# Patient Record
Sex: Male | Born: 1945 | Race: White | Hispanic: No | Marital: Married | Smoking: Never smoker
Health system: Southern US, Community
[De-identification: ages and names within clinical notes are randomized; demographics above are authoritative.]

## PROBLEM LIST (undated history)

## (undated) DIAGNOSIS — K5730 Diverticulosis of large intestine without perforation or abscess without bleeding: Secondary | ICD-10-CM

## (undated) DIAGNOSIS — N182 Chronic kidney disease, stage 2 (mild): Secondary | ICD-10-CM

## (undated) DIAGNOSIS — R112 Nausea with vomiting, unspecified: Secondary | ICD-10-CM

## (undated) DIAGNOSIS — K589 Irritable bowel syndrome without diarrhea: Secondary | ICD-10-CM

## (undated) DIAGNOSIS — E785 Hyperlipidemia, unspecified: Secondary | ICD-10-CM

## (undated) DIAGNOSIS — I10 Essential (primary) hypertension: Secondary | ICD-10-CM

## (undated) DIAGNOSIS — I2510 Atherosclerotic heart disease of native coronary artery without angina pectoris: Secondary | ICD-10-CM

## (undated) DIAGNOSIS — N400 Enlarged prostate without lower urinary tract symptoms: Secondary | ICD-10-CM

## (undated) DIAGNOSIS — H33311 Horseshoe tear of retina without detachment, right eye: Secondary | ICD-10-CM

## (undated) DIAGNOSIS — K219 Gastro-esophageal reflux disease without esophagitis: Secondary | ICD-10-CM

## (undated) DIAGNOSIS — Z9889 Other specified postprocedural states: Secondary | ICD-10-CM

## (undated) HISTORY — DX: Atherosclerotic heart disease of native coronary artery without angina pectoris: I25.10

## (undated) HISTORY — PX: RETINAL DETACHMENT SURGERY: SHX105

## (undated) HISTORY — PX: CHOLECYSTECTOMY: SHX55

## (undated) HISTORY — DX: Essential (primary) hypertension: I10

## (undated) HISTORY — PX: TEAR DUCT PROBING WITH STRABISMUS REPAIR: SHX5677

## (undated) HISTORY — DX: Chronic kidney disease, stage 2 (mild): N18.2

## (undated) HISTORY — DX: Gastro-esophageal reflux disease without esophagitis: K21.9

## (undated) HISTORY — DX: Benign prostatic hyperplasia without lower urinary tract symptoms: N40.0

## (undated) HISTORY — DX: Hyperlipidemia, unspecified: E78.5

## (undated) HISTORY — DX: Diverticulosis of large intestine without perforation or abscess without bleeding: K57.30

---

## 1993-04-16 HISTORY — PX: HIATAL HERNIA REPAIR: SHX195

## 1997-04-16 HISTORY — PX: LUMBAR LAMINECTOMY: SHX95

## 1997-08-26 ENCOUNTER — Inpatient Hospital Stay (HOSPITAL_COMMUNITY): Admission: RE | Admit: 1997-08-26 | Discharge: 1997-08-27 | Payer: Self-pay | Admitting: Neurosurgery

## 1998-04-16 HISTORY — PX: CORONARY ARTERY BYPASS GRAFT: SHX141

## 1998-08-16 ENCOUNTER — Inpatient Hospital Stay (HOSPITAL_COMMUNITY): Admission: AD | Admit: 1998-08-16 | Discharge: 1998-08-23 | Payer: Self-pay | Admitting: Cardiology

## 1998-08-17 ENCOUNTER — Encounter: Payer: Self-pay | Admitting: Cardiothoracic Surgery

## 1998-08-18 ENCOUNTER — Encounter: Payer: Self-pay | Admitting: Cardiothoracic Surgery

## 1998-08-19 ENCOUNTER — Encounter: Payer: Self-pay | Admitting: Cardiothoracic Surgery

## 1998-08-20 ENCOUNTER — Encounter: Payer: Self-pay | Admitting: Cardiothoracic Surgery

## 2000-10-04 ENCOUNTER — Ambulatory Visit (HOSPITAL_COMMUNITY): Admission: RE | Admit: 2000-10-04 | Discharge: 2000-10-04 | Payer: Self-pay | Admitting: Internal Medicine

## 2001-11-05 ENCOUNTER — Ambulatory Visit (HOSPITAL_COMMUNITY): Admission: RE | Admit: 2001-11-05 | Discharge: 2001-11-05 | Payer: Self-pay | Admitting: Internal Medicine

## 2002-11-23 ENCOUNTER — Emergency Department (HOSPITAL_COMMUNITY): Admission: EM | Admit: 2002-11-23 | Discharge: 2002-11-24 | Payer: Self-pay | Admitting: *Deleted

## 2003-05-10 ENCOUNTER — Emergency Department (HOSPITAL_COMMUNITY): Admission: EM | Admit: 2003-05-10 | Discharge: 2003-05-10 | Payer: Self-pay | Admitting: Emergency Medicine

## 2003-06-08 ENCOUNTER — Ambulatory Visit (HOSPITAL_COMMUNITY): Admission: RE | Admit: 2003-06-08 | Discharge: 2003-06-08 | Payer: Self-pay | Admitting: Cardiovascular Disease

## 2004-03-22 DIAGNOSIS — C4492 Squamous cell carcinoma of skin, unspecified: Secondary | ICD-10-CM

## 2004-03-22 DIAGNOSIS — C4491 Basal cell carcinoma of skin, unspecified: Secondary | ICD-10-CM

## 2004-03-22 HISTORY — DX: Basal cell carcinoma of skin, unspecified: C44.91

## 2004-03-22 HISTORY — DX: Squamous cell carcinoma of skin, unspecified: C44.92

## 2004-07-10 ENCOUNTER — Ambulatory Visit: Payer: Self-pay | Admitting: Cardiology

## 2004-10-06 ENCOUNTER — Encounter (INDEPENDENT_AMBULATORY_CARE_PROVIDER_SITE_OTHER): Payer: Self-pay | Admitting: Specialist

## 2004-10-06 ENCOUNTER — Ambulatory Visit (HOSPITAL_COMMUNITY): Admission: RE | Admit: 2004-10-06 | Discharge: 2004-10-06 | Payer: Self-pay | Admitting: General Surgery

## 2004-10-13 ENCOUNTER — Observation Stay (HOSPITAL_COMMUNITY): Admission: EM | Admit: 2004-10-13 | Discharge: 2004-10-14 | Payer: Self-pay | Admitting: Emergency Medicine

## 2005-04-16 HISTORY — PX: HEMORRHOID SURGERY: SHX153

## 2005-11-29 DIAGNOSIS — D229 Melanocytic nevi, unspecified: Secondary | ICD-10-CM

## 2005-11-29 HISTORY — DX: Melanocytic nevi, unspecified: D22.9

## 2006-02-01 ENCOUNTER — Encounter (INDEPENDENT_AMBULATORY_CARE_PROVIDER_SITE_OTHER): Payer: Self-pay | Admitting: Specialist

## 2006-02-01 ENCOUNTER — Observation Stay (HOSPITAL_COMMUNITY): Admission: RE | Admit: 2006-02-01 | Discharge: 2006-02-02 | Payer: Self-pay | Admitting: General Surgery

## 2006-02-14 ENCOUNTER — Ambulatory Visit: Payer: Self-pay | Admitting: Cardiology

## 2006-03-27 ENCOUNTER — Ambulatory Visit (HOSPITAL_COMMUNITY): Admission: RE | Admit: 2006-03-27 | Discharge: 2006-03-27 | Payer: Self-pay | Admitting: Urology

## 2007-02-17 ENCOUNTER — Ambulatory Visit: Payer: Self-pay | Admitting: Cardiology

## 2007-08-15 HISTORY — PX: ESOPHAGOGASTRODUODENOSCOPY: SHX1529

## 2007-08-22 ENCOUNTER — Ambulatory Visit: Payer: Self-pay | Admitting: Internal Medicine

## 2007-08-22 ENCOUNTER — Ambulatory Visit (HOSPITAL_COMMUNITY): Admission: EM | Admit: 2007-08-22 | Discharge: 2007-08-22 | Payer: Self-pay | Admitting: Emergency Medicine

## 2008-03-12 ENCOUNTER — Ambulatory Visit (HOSPITAL_COMMUNITY): Admission: RE | Admit: 2008-03-12 | Discharge: 2008-03-12 | Payer: Self-pay | Admitting: Family Medicine

## 2008-03-24 ENCOUNTER — Ambulatory Visit: Payer: Self-pay | Admitting: Cardiology

## 2008-04-16 DIAGNOSIS — N182 Chronic kidney disease, stage 2 (mild): Secondary | ICD-10-CM

## 2008-04-16 HISTORY — DX: Chronic kidney disease, stage 2 (mild): N18.2

## 2008-07-09 ENCOUNTER — Encounter (INDEPENDENT_AMBULATORY_CARE_PROVIDER_SITE_OTHER): Payer: Self-pay | Admitting: General Surgery

## 2008-07-09 ENCOUNTER — Ambulatory Visit (HOSPITAL_COMMUNITY): Admission: RE | Admit: 2008-07-09 | Discharge: 2008-07-09 | Payer: Self-pay | Admitting: General Surgery

## 2009-02-28 ENCOUNTER — Telehealth (INDEPENDENT_AMBULATORY_CARE_PROVIDER_SITE_OTHER): Payer: Self-pay | Admitting: *Deleted

## 2009-03-14 ENCOUNTER — Telehealth (INDEPENDENT_AMBULATORY_CARE_PROVIDER_SITE_OTHER): Payer: Self-pay

## 2009-03-15 ENCOUNTER — Encounter (INDEPENDENT_AMBULATORY_CARE_PROVIDER_SITE_OTHER): Payer: Self-pay | Admitting: *Deleted

## 2009-03-15 ENCOUNTER — Encounter: Payer: Self-pay | Admitting: Cardiology

## 2009-03-23 ENCOUNTER — Encounter: Payer: Self-pay | Admitting: Cardiology

## 2009-03-23 LAB — CONVERTED CEMR LAB
AST: 19 units/L (ref 0–37)
Albumin: 4.2 g/dL (ref 3.5–5.2)
Alkaline Phosphatase: 57 units/L (ref 39–117)
Chloride: 103 meq/L (ref 96–112)
Glucose, Bld: 112 mg/dL — ABNORMAL HIGH (ref 70–99)
LDL Cholesterol: 71 mg/dL (ref 0–99)
Potassium: 5 meq/L (ref 3.5–5.3)
Sodium: 139 meq/L (ref 135–145)
Total Protein: 6.8 g/dL (ref 6.0–8.3)

## 2009-03-24 ENCOUNTER — Encounter (INDEPENDENT_AMBULATORY_CARE_PROVIDER_SITE_OTHER): Payer: Self-pay | Admitting: *Deleted

## 2009-04-05 ENCOUNTER — Ambulatory Visit: Payer: Self-pay | Admitting: Cardiology

## 2009-04-05 ENCOUNTER — Encounter (INDEPENDENT_AMBULATORY_CARE_PROVIDER_SITE_OTHER): Payer: Self-pay | Admitting: *Deleted

## 2009-04-05 DIAGNOSIS — N183 Chronic kidney disease, stage 3 unspecified: Secondary | ICD-10-CM | POA: Insufficient documentation

## 2009-04-11 ENCOUNTER — Ambulatory Visit (HOSPITAL_COMMUNITY): Admission: RE | Admit: 2009-04-11 | Discharge: 2009-04-11 | Payer: Self-pay | Admitting: Cardiology

## 2009-04-19 ENCOUNTER — Encounter (INDEPENDENT_AMBULATORY_CARE_PROVIDER_SITE_OTHER): Payer: Self-pay | Admitting: *Deleted

## 2009-10-03 LAB — CONVERTED CEMR LAB
ALT: 15 units/L (ref 0–53)
Albumin: 4 g/dL (ref 3.5–5.2)
CO2: 26 meq/L (ref 19–32)
Calcium: 9.6 mg/dL (ref 8.4–10.5)
Chloride: 105 meq/L (ref 96–112)
Cholesterol: 142 mg/dL (ref 0–200)
Potassium: 4.8 meq/L (ref 3.5–5.3)
Sodium: 140 meq/L (ref 135–145)
Total Protein: 6.5 g/dL (ref 6.0–8.3)
VLDL: 25 mg/dL (ref 0–40)

## 2010-04-06 ENCOUNTER — Ambulatory Visit: Payer: Self-pay | Admitting: Cardiology

## 2010-04-07 ENCOUNTER — Encounter: Payer: Self-pay | Admitting: Adult Health

## 2010-04-07 LAB — CONVERTED CEMR LAB
ALT: 14 units/L (ref 0–53)
Cholesterol: 154 mg/dL (ref 0–200)
HDL: 41 mg/dL (ref >39)
Indirect Bilirubin: 0.8 mg/dL (ref 0.0–0.9)
Total CHOL/HDL Ratio: 3.8
Total Protein: 6.1 g/dL (ref 6.0–8.3)
Triglycerides: 127 mg/dL (ref <150)
VLDL: 25 mg/dL (ref 0–40)

## 2010-05-16 NOTE — Letter (Signed)
Summary: Bertha Results Engineer, agricultural at [facility]  [address], [place] [address]   Phone: [phone]  Fax: [phone]      April 19, 2009 MRN: [id number]   [name] [address] [place], [place]  [address]   Dear Mr. KUZEL,  Your test ordered by Selena Batten has been reviewed by your physician (or physician assistant) and was found to be normal or stable. Your physician (or physician assistant) felt no changes were needed at this time.  ____ Echocardiogram  ____ Cardiac Stress Test  ____ Lab Work  __x__ Peripheral vascular study of arms, legs or neck  ____ CT scan or X-ray  ____ Lung or Breathing test  ____ Other: No change in medical treatment at this time, per Dr. Dietrich Pates.   Thank you, Artavia Jeanlouis Allyne Gee RN    [place] Bing, MD, Lenise Arena.C.Gaylord Shih, MD, F.A.C.C Lewayne Bunting, MD, F.A.C.C Nona Dell, MD, F.A.C.C Charlton Haws, MD, Lenise Arena.C.C

## 2010-05-18 NOTE — Assessment & Plan Note (Signed)
Summary: F1Y   Visit Type:  Follow-up Primary Provider:  Dr.Golding   History of Present Illness: [name] is a 65 y/o CM we are following for continued assessement and treatment of CAD, with known history of 4 vessel CABG 2000, Hypercholesterolemia. He had a stress test 5 years ago which was normal. He remains active, walking every day, hunting and fishing.  He is without complaints.  He has had no new diagnosis or allergies since being seen last.  Current Medications (verified): 1)  Vytorin 10-20 Mg Tabs (Ezetimibe-Simvastatin) .... Take 1 Tablet By Mouth Once Daily 2)  Lisinopril 10 Mg Tabs (Lisinopril) .... Take 1 Tab Daily 3)  Flomax 0.4 Mg Caps (Tamsulosin Hcl) .... Take 1 Tab Daily 4)  Aspir-Low 81 Mg Tbec (Aspirin) .... Take 1 Tab Daily 5)  Coq10 100 Mg Caps (Coenzyme Q10) .... Take 1 Tab Daily 6)  Daily Multi  Tabs (Multiple Vitamins-Minerals) .... Take 1 Tab Daily 7)  Viagra 50 Mg Tabs (Sildenafil Citrate) .... Take As Directed 8)  Eql Omeprazole 20 Mg Tbec (Omeprazole) .... One B.i.d. 9)  Prosvent .... Take 1 Tab Daily 10)  Fish Oil 1000 Mg Caps (Omega-3 Fatty Acids) .... Take 1 Tab Daily  Allergies (verified): 1)  ! Codeine 2)  ! Nubain  Comments:  Nurse/Medical Assistant: patient brought meds the only new one is prosvent to help with his flomas for prostate patient uses [facility]  Past History:  Past medical, surgical, family and social histories (including risk factors) reviewed, and no changes noted (except as noted below).  Past Medical History: Reviewed history from 04/05/2009 and no changes required. ASCVD: CABG surgery in 08/1998. GERD: Status post esophageal dilatation and repair of hiatal hernia. Hypertension. Hyperlipidemia Benign prostatic hypertrophy with history of prostatitis/urinary tract infection in 2006 Hiatal hernia Sigmoid diverticulosis  Past Surgical History: Reviewed history from 04/05/2009 and no changes  required. Cholecystectomy-1990s. Hiatal hernia repair: 1995. Lumbar laminectomy-1999 Iinternal hemorrhoidectomy-2007  Family History: Reviewed history from 03/24/2008 and no changes required. Father died at age 38 due to myocardial infarction. Mother alive and well at age 88. Paternal grandfather died at age 67 due to myocardial infarction. Only sibling, a brother, died you to trauma.  Social History: Reviewed history from 03/24/2008 and no changes required. Previously employed as an Public affairs consultant. No use of tobacco products. No excessive alcohol.  Review of Systems       All other systems have been reviewed and are negative unless stated above.   Vital Signs:  Patient profile:   65 year old male Weight:      181 pounds BMI:     26.06 O2 Sat:      98 % on Room air Pulse rate:   64 / minute BP sitting:   151 / 78  (right arm)  Vitals Entered By: Michael Saa, CNA (April 06, 2010 11:20 AM)  O2 Flow:  Room air  Physical Exam  General:  Well developed, well nourished, in no acute distress. Head:  normocephalic and atraumatic Eyes:  PERRLA/EOM intact; conjunctiva and lids normal. Neck:  Neck supple, no JVD. No masses, thyromegaly or abnormal cervical nodes. Lungs:  Clear bilaterally to auscultation and percussion. Heart:  Non-displaced PMI, chest non-tender; regular rate and rhythm, S1, S2 without murmurs, rubs or gallops. Carotid upstroke normal, no bruit. Normal abdominal aortic size, no bruits. Femorals normal pulses, no bruits. Pedals normal pulses. No edema, no varicosities. Abdomen:  Bowel sounds positive; abdomen soft and non-tender without masses, organomegaly,  or hernias noted. No hepatosplenomegaly. Msk:  Back normal, normal gait. Muscle strength and tone normal. Pulses:  pulses normal in all 4 extremities Extremities:  No clubbing or cyanosis. Neurologic:  Alert and oriented x 3. Psych:  Normal affect.   EKG  Procedure date:   04/06/2010  Findings:      Normal sinus rhythm with rate of:60 bpm  Right bundle branch block.  No significant change from one year ago.  Impression & Recommendations:  Problem # 1:  ATHEROSCLEROTIC CARDIOVASCULAR DISEASE-CABG 2000 (ICD-429.2) He is without complaint and remains active.  I have discussed the need to follow-up next year with a stress test for continued evaluation of CAD.  At this time is remarkably well 11 years post CABG.  He has not required NTG and has not had discomfort.  We will continue him on current medications and seen him in one year.  Will plan stress myoview at that time.  Problem # 2:  HYPERLIPIDEMIA (ICD-272.4) Will check status as fish oil has been added.  Then annually after that. His updated medication list for this problem includes:    Vytorin 10-20 Mg Tabs (Ezetimibe-simvastatin) .Marland Kitchen... Take 1 tablet by mouth once daily  Orders: T-Lipid Profile ([id number]) T-Hepatic Function [phone])  Problem # 3:  HYPERTENSION (ICD-401.9) Recheck in the exam room demonstrated BP of 133/78.  We will make no changes in medications at this time. His updated medication list for this problem includes:    Lisinopril 10 Mg Tabs (Lisinopril) .Marland Kitchen... Take 1 tab daily    Aspir-low 81 Mg Tbec (Aspirin) .Marland Kitchen... Take 1 tab daily  Patient Instructions: 1)  Your physician recommends that you schedule a follow-up appointment in: 12 months 2)  Your physician recommends that you return for lab work in: this week 3)  Your physician recommends that you continue on your current medications as directed. Please refer to the Current Medication list given to you today. Prescriptions: VYTORIN 10-20 MG TABS (EZETIMIBE-SIMVASTATIN) Take 1 tablet by mouth once daily  #90 x 3   Entered by:   [name]   Authorized by:   Joni Reining, NP   Signed by:   [name] on 30/86/5784   Method used:   Faxed to ...       [facility] (mail-order)             , [place]         Ph: [phone]        Fax: [phone]   RxID:   [id number] LISINOPRIL 10 MG TABS (LISINOPRIL) take 1 tab daily  #90 x 3   Entered by:   [name]   Authorized by:   Joni Reining, NP   Signed by:   [name] on 38/75/6433   Method used:   Faxed to ...       [facility] (mail-order)             , [place]         Ph: [phone]       Fax: [phone]   RxID:   [id number] VYTORIN 10-20 MG TABS (EZETIMIBE-SIMVASTATIN) Take 1 tablet by mouth once daily  #30 x 11   Entered by:   [name]   Authorized by:   Joni Reining, NP   Signed by:   [name] on 62/37/6283   Method used:   Faxed to ...       [facility] (mail-order)             ,  [place]         Ph: [phone]       Fax: [phone]   RxID:   [id number]

## 2010-07-27 LAB — CBC
Hemoglobin: 14 g/dL (ref 13.0–17.0)
Platelets: 167 10*3/uL (ref 150–400)
RDW: 13.4 % (ref 11.5–15.5)

## 2010-07-27 LAB — BASIC METABOLIC PANEL
Calcium: 9.8 mg/dL (ref 8.4–10.5)
GFR calc non Af Amer: 45 mL/min — ABNORMAL LOW (ref >60)
Glucose, Bld: 111 mg/dL — ABNORMAL HIGH (ref 70–99)
Sodium: 138 mEq/L (ref 135–145)

## 2010-08-29 NOTE — Letter (Signed)
February 17, 2007    Patrica Duel, M.D.  [address]  [place], [place] [address]   RE:  [name]  MRN:  [id number]  /  DOB:  1945/11/11   Dear Loraine Leriche:   [name] returns to the office for continued assessment and treatment  of coronary disease, now 8 years following CABG surgery.  He continues  to do superbly with good exercise tolerance, no chest discomfort and no  dyspnea.  He does have problems with GERD, that are relatively well  controlled with medical therapy.  Hypertension and hyperlipidemia have  been under excellent control.   CURRENT MEDICATIONS:  1. Lisinopril 10 mg daily.  2. Aspirin 81 mg daily.  3. A multivitamin.  4. Vytorin 10/20 mg daily - he experiences muscle weakness with higher      doses of statin.  5. Flomax 0.4 mg daily.  6. Coenzyme Q.  7. Prilosec 20 mg b.i.d.  8. Fish Oil 1000 mg daily.   EXAM:  A vigorous appearing gentleman in no acute distress.  The weight  is 178, 8 pounds more than last year.  Blood pressure 115/70, heart rate  62 and regular, respirations 16.  NECK:  No jugular venous distention; normal carotid upstrokes without  bruits.  LUNGS:  Clear.  CARDIAC:  Normal first and second heart sounds; fourth heart sound  present.  ABDOMEN:  Soft and nontender; no organomegaly.  EXTREMITIES:  No edema; distal pulses intact except for a decreased left  dorsalis pedis.   Recent lipid profile was excellent with total cholesterol of 157,  triglycerides of 158, HDL of 46 and LDL of 79.   IMPRESSION:  [name]'s medical therapy is optimal.  I suggested that  he could increase his dose of Fish Oil to 2 to 4 capsules over the  course of a day.  He remains opposed to influenza vaccine.  I will see  this nice gentleman again in 1 year.    Sincerely,      Gerrit Friends. Dietrich Pates, MD, [facility]  Electronically Signed    RMR/MedQ  DD: 02/17/2007  DT: 02/18/2007  Job #: [id number]

## 2010-08-29 NOTE — Op Note (Signed)
NAMEALMA, MUEGGE              ACCOUNT NO.:  [id number]   MEDICAL RECORD NO.:  [id number]          PATIENT TYPE:  AMB   LOCATION:  DAY                           FACILITY:  [facility]   PHYSICIAN:  R. Roetta Sessions, M.D. DATE OF BIRTH:  [birth year]   DATE OF PROCEDURE:  DATE OF DISCHARGE:                               OPERATIVE REPORT   INDICATIONS FOR PROCEDURE:  This 65 year old gentleman with a history  esophageal rings/strictures undergone dilation by me in the past last in  2002.  He was eating some chicken yesterday and felt the pain.  He came  to see Dr. Margretta Ditty today as he had continued symptoms.  Dr. Margretta Ditty  called me.  An urgent EGD is now being done with plans for esophageal  food disimpaction as appropriate.  The potential for dilation was  discussed, but if he had a food impaction at home, Mr. Halley had to come  back for subsequent procedure.  Risks, benefits, alternatives, and  limitations have been reviewed.  Questions are answered.  Please see the  documentation in the medical record.   PROCEDURE NOTE:  O2 saturation, blood pressure, pulse, and respirations  monitored throughout the entire procedure.   CONSCIOUS SEDATION:  Versed 3 mg IV and Demerol 75 mg IV in divided  doses.   INSTRUMENT:  Pentax video chip system.   FINDINGS:  Examination of the tubular esophagus revealed an empty  esophagus with a tight fibrous ring at the EG junction, which would not  initially admit the scope.  With some gentle pressure, this ring was  dilated and the scope was passed.   Stomach:  Gas cavity was emptied and insufflated well with air.  The  examination of the gastric mucosa including retroflexed view of the  proximal stomach and esophagogastric junction demonstrated only a  moderate size hiatal hernia.  Pylorus patent, easily traversed.  Examination of the bulb and second portion revealed no abnormality.   THERAPEUTIC/DIAGNOSTIC MANEUVERS PERFORMED:  The scope was  withdrawn.  A  56-French Maloney dilators was passed with full insertion with slight  resistance, look back revealed a nice disruption of the ring/stricture  without apparent complication.  The patient tolerated the procedure well  and was reactive in endoscopy.   IMPRESSION:  Critical esophageal ring/stricture as described above,  status post disruption and dilation as described above.  No food  impaction, otherwise normal esophagus, hiatal hernia, otherwise normal  stomach, D1 and D2.   RECOMMENDATIONS:  Chew food thoroughly.  Take 30 minutes to eat and have  liquids on hand to assist with swallowing.  Continue Prilosec 20 mg  orally b.i.d.  Mr. Kemler has to come if he has any future difficulty  swallowing.      Jonathon Bellows, M.D.  Electronically Signed     RMR/MEDQ  D:  08/22/2007  T:  08/23/2007  Job:  [id number]   cc:   Patrica Duel, M.D.  Fax: [phone]   Rhae Lerner. Margretta Ditty, M.D.  [address]  [place]  [place] [address]

## 2010-08-29 NOTE — Op Note (Signed)
[name], [name]              ACCOUNT NO.:  [id number]   MEDICAL RECORD NO.:  [id number]          PATIENT TYPE:  AMB   LOCATION:  DAY                           FACILITY:  [facility]   PHYSICIAN:  Tilford Pillar, MD      DATE OF BIRTH:  Mar 30, 1946   DATE OF PROCEDURE:  07/09/2008  DATE OF DISCHARGE:  07/09/2008                               OPERATIVE REPORT   PREOPERATIVE DIAGNOSIS:  Lipoma of the right flank.   POSTOPERATIVE DIAGNOSIS:  Lipoma of the right flank.   PROCEDURE:  Excision of lipoma via 2-cm incision.   SURGEON:  Dr. Tilford Pillar.   ANESTHESIA:  MAC with local anesthetic.   SPECIMEN:  Lipoma.   ESTIMATED BLOOD LOSS:  Minimal.   COMPLICATIONS:  None.   DISPOSITION:  To postanesthetic care unit in stable condition.   INDICATIONS:  The patient is a 65 year old male who presented to my  office with a history of a lump in his right flank.  This was evaluated  and was suspected to be a lipoma.  The risks, benefits and alternatives  of excision were discussed at length with the patient, including, but  not limited to the risk of bleeding, infection and recurrence.  The  patient's questions were answered.  The patient was consented for the  planned procedure.   OPERATION:  The patient was taken to the operating and placed in the  supine position on the operating room table at which time the sedation  was administered.  Once the patient was asleep, he was repositioned so  that his right flank was elevated on a roll.  Care was taken to ensure  no decubitus pressure points were encountered with proper padding of the  patient.  At this time, the patient's right flank was prepped with  DuraPrep solution.  Drapes were placed in the standard fashion.  Local  anesthetic was instilled over the planned site of dissection.  An  elliptical incision was created over the palpable lipoma.  Additional  dissection down to subcutaneous tissue was carried out using  electrocautery.   Upon encountering the lipoma, it was freed  circumferentially.  Once it was free, it was excised and placed on the  back table and sent as a permanent specimen to pathology.  Hemostasis  was obtained using electrocautery.  The surgical field was irrigated and  then a 4-0 Monocryl was utilized to reapproximate the skin edges in a  running subcuticular suture.  The skin was washed and dried with a moist  and dry towel.  Benzoin was applied around the incision.  Half inch  Steri-Strips were placed.  The drapes were removed.  The patient was  replaced back into a supine position and allowed to awaken from the  sedation.  Once awake, he was transferred back to the regular hospital  bed and transferred to the postanesthetic care unit in stable condition.  At the conclusion of the procedure, all instrument, sponge and needle  counts were correct.  The patient tolerated the procedure well.      Tilford Pillar, MD  Electronically Signed  BZ/MEDQ  D:  07/28/2008  T:  07/28/2008  Job:  [id number]

## 2010-08-29 NOTE — Letter (Signed)
March 24, 2008    Patrica Duel, M.D.  [address], [address]  [place], [place] [address]   RE:  [name], [name]  MRN:  [id number]  /  DOB:  [birth year]   Dear Loraine Leriche,   [name] returns to the office as scheduled for continued annual  followup of coronary artery disease and cardiovascular risk factors.  He  continues to be asymptomatic from a cardiovascular standpoint, now more  than 9 years following CABG surgery.  He is active without any problems  whatsoever.  He has had no significant health issues during the past 12  months except for an injury to his right leg that appears to be healing.  This occurred 3-4 weeks ago when he was seen in your office.  X-rays  were negative.  He had substantial swelling that is now subsiding.  He  is walking fine without any calf discomfort.   Current medications include:  1. Lisinopril 10 mg daily.  2. Vytorin 10/20 mg daily.  3. Flomax 0.4 mg daily.  4. Aspirin 81 mg daily.  5. Viagra p.r.n.   PHYSICAL EXAMINATION:  GENERAL:  Pleasant gentleman in no acute  distress.  VITAL SIGNS:  The weight is 186, 8 pounds more than last year.  Blood  pressure 105/70, heart rate 65 and regular, respirations 14.  NECK:  No jugular venous distention; no carotid bruits.  LUNGS:  Clear.  CARDIAC:  Normal first and second heart sounds; minimal systolic  ejection murmur at the cardiac base.  ABDOMEN:  Soft and nontender; liver edge palpable about 2-3 cm below the  right costal margin with some increase in the overall span; edge is firm  and nontender.  EXTREMITIES:  Edema 1+ on the right with the right lower leg  significantly larger in diameter than the left leg; some residual  bruising; normal distal pulses.   Recent lipid profile is excellent with total cholesterol of 151,  triglycerides of 137, HDL of 46 and LDL of 78.  Hepatic profile is  normal.   IMPRESSION:  [name] is doing extremely well with current medical  therapy.  Hypertension  and hyperlipidemia are adequately controlled.  He  was reminded to watch his weight.  I will see this nice gentleman again  in 1 year.    Sincerely,      Gerrit Friends. Dietrich Pates, MD, [facility]  Electronically Signed    RMR/MedQ  DD: 03/24/2008  DT: 03/25/2008  Job #: [id number]

## 2010-08-29 NOTE — H&P (Signed)
[name], [name]              ACCOUNT NO.:  [id number]   MEDICAL RECORD NO.:  [id number]          PATIENT TYPE:  AMB   LOCATION:  DAY                           FACILITY:  [facility]   PHYSICIAN:  Tilford Pillar, MD      DATE OF BIRTH:  December 22, 1945   DATE OF ADMISSION:  DATE OF DISCHARGE:  LH                              HISTORY & PHYSICAL   CHIEF COMPLAINT:  Lump on right side.   HISTORY OF PRESENT ILLNESS:  The patient is a 65 year old male who has  noted a soft tissue nodule on the right flank just inferior to the  costal margin.  He has noted this for the last approximately 1-2 months  and states that this continues to give him some discomfort in this area.  He has not noted any change in size.  He did not state any prior  knowledge of any skin lesion in the area.  He has not noticed any other  masses or lipomas or soft tissue masses.  The patient did have an  episode of a fall on February 3 at which point he was cutting down a  tree an he had cut back and the tree knocked him over and it did land on  the motor end of the chain saw.  He did have some bruising associated  with chest pain along the right pectoralis.  He did not have any history  of ecchymosis over the area or nodularity.  It has not decreased in size  since the event.   PAST MEDICAL HISTORY:  Hypertension and hypercholesterolemia.   PAST SURGICAL HISTORY:  He has had hemorrhoidectomy, CABG, back surgery  and he had an abdominal wall hernia and a hiatal hernia repair.   MEDICATIONS:  Lisinopril, Vytorin, Flomax, Viagra.   ALLERGIES:  Codeine and Nubain.   SOCIAL HISTORY:  No tobacco and no alcohol use.  Occupation:  He is an  Art gallery manager at [employer].   PERTINENT FAMILY HISTORY:  There is coronary artery disease with  multiple family members with a history of heart disease, as well as its  history of cancers.   REVIEW OF SYSTEMS:  CONSTITUTIONAL:  Occasional headaches.  EYES:  Unremarkable.  ENT:  Occasional  rhinorrhea and seasonal allergies.  RESPIRATORY:  Unremarkable.  CARDIOVASCULAR:  Unremarkable.  GASTROINTESTINAL:  Unremarkable.  GENITOURINARY:  Frequency.  MUSCULOSKELETAL:  Arthralgias of back and joints.  SKIN:  Dry, as well as in the HPI.  ENDOCRINE:  Unremarkable.  NEURO:  Unremarkable.   PHYSICAL EXAMINATION:  GENERAL:  The patient is a healthy appearing  male.  He is calm.  He is not in any acute distress.  He is alert and  oriented x3.  HEENT:  Scalp:  No deformities, no masses.  Eyes:  Pupils  equal, round, and reactive.  Extraocular movements are intact.  No  conjunctival pallor is noted.  Oral mucosa is pink with normal  occlusion.  NECK:  Trachea is midline.  No supraclavicular or cervical  lymphadenopathy.  PULMONARY:  Unlabored respiration.  No wheezes.  No crackles.  He is  clear to  auscultation bilaterally.  CARDIOVASCULAR:  Regular rate and rhythm.  He has 2+ radial and dorsal  pedis pulses bilaterally.  ABDOMEN:  Positive bowel sounds.  Abdomen is soft.  He has a scar noted  from the sternal manubrium to just above his pubic tubercle.  This  appears to be in continuity and is consistent with his prior operations.  On his right lateral abdominal wall near the right flank, the patient  does have a small, mobile, round, approximately 1-2 cm nontender soft  tissue mass.  No other abnormalities are noted.   ASSESSMENT:  Lipoma.  At this time, I did discuss with the patient the  findings.  While this could be related to his trauma, the suspicion is a  likely a lipoma that has been present prior to his injury and I do not  have a suspicion of the time of a hematoma.  Findings of a hematoma were  discussed with the patient and I did discuss with the patient the risks,  benefits, and alternatives of excision of a lipoma again which I think  this soft tissue mass represents.  I also discussed the possibility that  this could be a sebaceous cyst although due to its   location and his  physical exam findings, I would be extremely suspicious of this.  In  regards to excision of the lipoma, I did discuss the risk of bleeding,  infection, and recurrence and furthermore, I did discuss with the  patient the option of continued close monitoring versus operative  intervention.  At this time, the patient does wish to proceed with  excision and we will plan to schedule this at the patient's earliest  convenience.      Tilford Pillar, MD  Electronically Signed     BZ/MEDQ  D:  07/08/2008  T:  07/08/2008  Job:  [id number]   cc:   Patrica Duel, M.D.  Fax: [phone]   Short stay surgery

## 2010-09-01 NOTE — H&P (Signed)
[name], [name]              ACCOUNT NO.:  [id number]   MEDICAL RECORD NO.:  [id number]          PATIENT TYPE:  INP   LOCATION:  A310                          FACILITY:  [facility]   PHYSICIAN:  Patrica Duel, M.D.    DATE OF BIRTH:  1946-03-11   DATE OF ADMISSION:  10/12/2004  DATE OF DISCHARGE:  LH                                HISTORY & PHYSICAL   CHIEF COMPLAINT:  Weakness, nausea, vomiting.   HISTORY OF PRESENT ILLNESS:  This is a 65 year old male with a history of  atherosclerotic cardiovascular disease. He underwent coronary aortic bypass  grafting in 2000 with an excellent result. He is followed by cardiology. He  also has a history of back surgery, cholecystectomy, hiatal hernia, and  hypertension which has been well controlled. Medications noted below.   The patient underwent hemorrhoidectomy and repair of rectal prolapse five  days prior to presenting to our emergency department. He had a Foley  catheter while hospitalized and had significant burning sensation associated  with a Foley. The surgery was uncomplicated. Upon discharge, the patient  continued to experience severe dysuria and soon developed fever. He was seen  by his surgeons the day of admission. The operative site is clean and  healing well. Rectal exam confirmed presence of prostatitis. He has been on  Cipro for two days.   The patient continued to experience nausea, vomiting, and increasing severe  weakness and presented to our emergency department.   In the ER, the patient was obviously dehydrated. Laboratory parameters  obtained included a CBC with a normal white count and hemoglobin of 12.5 and  hematocrit of 35.5, platelet count 160,000. MET7:  BUN 21, creatinine 1.7,  glucose 117, electrolytes normal. Urine specific gravity greater than 1.030,  positive nitrite, rare bacteria, greater than 8 urobilinogen.   The patient is admitted with dehydration related to UTI which was most  likely iatrogenic  in nature.   The patient does report a history of prostatitis in the remote past.   There is no history of headache, neurologic deficits, chest pain, shortness  of breath, syncope, melena, hematemesis, hematochezia, or other  genitourinary symptoms.   The patient is admitted with dehydration probably secondary to  prostatitis/UTI following uncomplicated hemorrhoidectomy/rectal prolapse  procedure.   CURRENT MEDICATIONS:  1.  Cipro 500 mg b.i.d. (____________).  2.  Phenergan p.r.n.  3.  Lisinopril 10 mg q.d.  4.  Prilosec 20 mg q.d.   PAST MEDICAL HISTORY:  As noted above.   FAMILY HISTORY:  Noncontributory.   REVIEW OF SYSTEMS:  Negative except as mentioned.   SOCIAL HISTORY:  Nonsmoker, nondrinker.   PHYSICAL EXAMINATION:  GENERAL:  This is a very pleasant male who is alert  and oriented in no acute distress.  VITAL SIGNS:  At presentation, temperature 101.3, blood pressure 121/76,  pulse 86, respirations 20.  HEENT:  Normocephalic, atraumatic. Pupils are equal. Ears, nose, and throat  are benign.  NECK:  The neck is supple without bruits or masses.  LUNGS:  Clear.  HEART:  Sounds are distant but normal.  ABDOMEN:  Nontender, nondistended.  Bowel sounds are intact.  RECTAL:  Reveals clean surgical site. Prostate exam declined.  NEUROLOGICAL:  Without focal deficits.  EXTREMITIES:  No clubbing, cyanosis, or edema.   ASSESSMENT:  Nausea, vomiting and urinary tract infection status post Foley  catheterization for uncomplicated hemorrhoidectomy and repair of rectal  prolapse. Cardiovascular status is stable at this time.   PLAN:  Hydration, empiric Cipro, urine culture, ultrasound of kidneys to  rule out obstructive uropathy (creatinine 1.7). Will continue hydration and  follow and treat expectantly.       MC/MEDQ  D:  10/13/2004  T:  10/13/2004  Job:  [id number]

## 2010-09-01 NOTE — Op Note (Signed)
[name], [name]              ACCOUNT NO.:  [id number]   MEDICAL RECORD NO.:  [id number]          PATIENT TYPE:  OBV   LOCATION:  A310                          FACILITY:  [facility]   PHYSICIAN:  Barbaraann Barthel, M.D. DATE OF BIRTH:  12-01-1945   DATE OF PROCEDURE:  02/01/2006  DATE OF DISCHARGE:  02/02/2006                                 OPERATIVE REPORT   Surgery was asked to see this 65 year old white male with prolapsed,  necrotic, bleeding internal hemorrhoid.  We took him to surgery was plans  for excision.  Past medical history is consistent with two previous the  hemorrhoidectomy procedures in the past.   We discussed complications not limited to but including bleeding and  infection and anal stenosis and fissure and informed consent was obtained.   GROSS OPERATIVE FINDINGS:  Those consistent with prolapsed internal  hemorrhoid with area of necrosis on the right posterior bundle.   SPECIMEN:  Internal hemorrhoid, right posterior bundle.   TECHNIQUE:  The patient was placed in the jackknife prone position after  adequate spinal anesthesia.  Rigid proctoscopy was performed to 15 cm.  There were no abnormalities in this area and we then proceeded with internal  hemorrhoidectomy this was done by grasping the prolapsed bundle and the  internal hemorrhoid with the Bowie clamped and then excising this and then  oversewing the clamp with a 2-0 chromic suture from within the rectum out  towards the anus and then removing the hemorrhoidal clamp and tightening  this and then using locking stitches going from external to internal and  ligating this.  We checked for hemostasis.  This was deemed complete.  For  added comfort I added a perianal block and added viscous Xylocaine dressing.  Prior to closure all sponge, needle and instrument counts found to be  correct.  Estimated blood loss was minimal.  The patient tolerated procedure  well was taken to recovery room in satisfactory  condition.   This patient has been lax and seeing following up with his cardiologist.  I  will make sure that Dr. Dietrich Pates sees him because he has not been seen in a  while and he requested this as well.  I will also see that Dr. Nobie Putnam sees  him, who referred this patient my way.   PROCEDURE:  1. Rigid proctoscopy.  2. Internal hemorrhoidectomy.      Barbaraann Barthel, M.D.  Electronically Signed     WB/MEDQ  D:  02/01/2006  T:  02/02/2006  Job:  [id number]   cc:   Gerrit Friends. Dietrich Pates, MD, [facility]  [address]  [place], [place] [address]   Patrica Duel, M.D.  Fax: [phone]

## 2010-09-01 NOTE — Letter (Signed)
February 14, 2006    Patrica Duel, M.D.  [address], [address]  [place], [place] [address]   RE:  MATTEW, CHRISWELL  MRN:  [id number]  /  DOB:  1946/02/22   Dear Loraine Leriche:   Mr. Reggio returned to the office at the insistence of Dr. Malvin Johns, who  recently performed hemorrhoid surgery.  As part of his preoperative  evaluation, he noted that the patient had not been seen in this office in 2  years.  Despite this, Mr. Kienast has done beautifully.  He has no  cardiopulmonary symptoms.  Control of hyperlipidemia has been excellent  under your direction.  He has had minimal high blood pressure.  He has never  used tobacco products.  He has some problems with prostatism, but these are  currently under good control.  He is healing up well from his rectal  surgery.   CURRENT MEDICATIONS:  1. Lisinopril 10 mg daily.  2. Vytorin 10/20 mg daily.  3. Aspirin 81 mg daily.  4. Flomax 0.4 mg nightly.   PHYSICAL EXAMINATION:  Very pleasant gentleman in no acute distress.  The weight is 170, 4 pounds less than in 2006, blood pressure 120/80, heart  rate 65 and regular, respirations 16.  NECK:  No jugular venous distention; normal carotid upstrokes without  bruits.  LUNGS:  Clear.  CARDIAC:  Normal first and second heart sounds; fourth heart present.  ABDOMEN:  Midline surgical incision; soft and nontender; no organomegaly.  EXTREMITIES:  1/2+ ankle edema; normal distal pulses.   Recent lipid profile is excellent, with total cholesterol of 127,  triglycerides 97, LDL of 66 and normal LFTs.   IMPRESSION:  Mr. Lucarelli is doing well with his current therapy.  I suggested  that he add fish oil to his regimen.  I will reassess this nice gentleman  again in 1 year.  He is opposed to annual influenza vaccine, which was  offered but not accepted.    Sincerely,      Gerrit Friends. Dietrich Pates, MD, [facility]  Electronically Signed    RMR/MedQ  DD: 02/14/2006  DT: 02/15/2006  Job #: [id number]

## 2010-09-01 NOTE — Op Note (Signed)
[facility]  Patient:    [name], [name] Visit Number: [id number] MRN: [id number]          Service Type: END Location: DAY Attending Physician:  Jonathon Bellows Dictated by:   Roetta Sessions, M.D. Proc. Date: 11/05/01 Admit Date:  11/05/2001 Discharge Date: 11/05/2001   CC:         Patrica Duel, M.D., [facility]   Operative Report  INDICATIONS FOR PROCEDURE:  The patient is a 65 year old gentleman with rare hematochezia (1-2 episodes yearly) in a setting with otherwise normal bowel function.  He has no abdominal pain, no family history of colon cancer. Colonoscopy is now being done to further evaluate for hematochezia and for colorectal cancer screening.  This approach has been discussed with [name] previously and again today at the bedside.  Potential risks, benefits, and alternatives have been reviewed.  He is a low risk for conscious sedation with Versed and fentanyl (he became nauseated after receiving Demerol previously). Please see the documentation on the medical record for more information.  PROCEDURE NOTE:  O2 saturation, blood pressure, pulse, and respirations were monitored throughout the entire procedure.  CONSCIOUS SEDATION:  Fentanyl 100 mcg IV in divided doses, Versed 4 mg IV in divided doses.  INSTRUMENT:  Olympus video chip colonoscope.  FINDINGS:  Digital rectal examination revealed a slightly tender prostate (the patient just had prostate biopsy) less than one week ago).  ENDOSCOPIC FINDINGS:  Prep was good.  RECTAL: Examination of the rectal mucosa including retroflexion of the anal verge revealed a couple of small, what appeared to be puncture marks on the anterior rectal wall, consistent with his history of recent prostate biopsy. There was some anal canal hemorrhoids.  The remainder of the rectum appeared normal.  Retroflexion was performed.  COLON:  The colonic mucosa was surveyed from the  rectosigmoid junction through the left transverse and right colon to the area of the appendiceal orifice, ileocecal valve, and cecum.  These structures were well-seen and photographed for the record.  The patient was noted to have scattered left-sided diverticula.  The remainder of the colonic mucosa appeared normal.  From the level of the cecum and ileocecal valve, the scope was slowly withdrawn.  All previously mentioned mucosal surfaces were again seen, and again, no abnormalities were observed.  The patient tolerated the procedure well and was reacted in endoscopy.  IMPRESSION: 1. Anal canal hemorrhoids.  Puncture marks from the anterior rectal    wall,  consistent with recent prostate biopsy.  Remainder of rectal mucosa appeared normal. 2. Left-sided diverticula.  Remainder of colonic mucosa appeared normal.  RECOMMENDATIONS: 1. Diverticulosis literature provided to [name]. 2. Daily Metamucil, Citrucel, or Benefiber fiber supplement. 3. Repeat colonoscopy in ten years. Dictated by:   Roetta Sessions, M.D. Attending Physician:  Jonathon Bellows DD:  11/05/01 TD:  11/09/01 Job: [id number]

## 2010-09-01 NOTE — Op Note (Signed)
NAMEMONTAVIS, SCHUBRING              ACCOUNT NO.:  [id number]   MEDICAL RECORD NO.:  [id number]          PATIENT TYPE:  AMB   LOCATION:  DAY                          FACILITY:  [facility]   PHYSICIAN:  Ollen Gross. Vernell Morgans, M.D. DATE OF BIRTH:  09-Dec-1945   DATE OF PROCEDURE:  10/06/2004  DATE OF DISCHARGE:                                 OPERATIVE REPORT   PREOPERATIVE DIAGNOSIS:  Internal hemorrhoids with prolapse and bleeding.   POSTOPERATIVE DIAGNOSIS:  Internal hemorrhoids with prolapse and bleeding.   PROCEDURES:  PPH hemorrhoidectomy.   SURGEON:  Ollen Gross. Carolynne Edouard, M.D.   ASSISTANT:  Anselm Pancoast. Zachery Dakins, M.D.   ANESTHESIA:  General endotracheal.   DESCRIPTION OF PROCEDURE:  After informed consent was obtained, the patient  was brought to the operating room, left in the supine position on a  stretcher. After adequate induction of general anesthesia, the patient was  moved into a prone position on the operating room table and all pressure  points were padded. The patient was then placed in a prone jackknife  position and the buttocks were retracted laterally with tape. The perirectal  region was then prepped with Betadine and draped in the usual sterile  manner. The perirectal area was then infiltrated with 0.25% Marcaine with  epinephrine and 1 mL of Wydase and the tissue was massaged gently for a  couple of minutes. Next a deep Fansler retractor was placed within the  rectum only internal hemorrhoids were noted. A 2-0 Prolene pursestring  stitch was then placed circumferentially inside the rectum approximately 4-5  cm deep to the dentate line making sure that each stitch started where the  last stitch ended. Once this was placed in a 360 degree fashion gathering  just mucosa and submucosa with the needle, the Fansler retractor was then  removed. The two ends of the Prolene stitch were brought through a clear  plastic retractor and white dilator, the retractor and dilator were then  placed within the rectum. The white dilator was then removed, the PPH  stapling device was then placed in the rectum and felt to drop below the  pursestring stitch. Once this was accomplished, the pursestring stitch was  then cinched down and tied, the two tails of the pursestring stitch were  then brought through the lateral holes in the [facility] stapling device and an air  knot was thrown for retraction. Next the stapling device was tightened all  the way down while advancing the stapling device into the rectum while it  was tightened. Once this was accomplished, the stapling device appeared to  be in good position inside the rectum. A minute was allowed to pass, the  stapling device was then fired, another minute was allowed to pass and the  stapling device was then opened a full turn and then removed from the  rectum. The specimen was examined and appeared to be a good uniform  circumferential piece of hemorrhoid with no muscle identified. The regular  Fansler retractor was then placed within the rectum to examine the staple  line, several bleeding points on the staple line were  controlled with figure-  of-eight 4-0 Vicryl stitches. Once this was accomplished, the staple line  was completely hemostatic and in good position just deep to the dentate  line. Next the perirectal region was infiltrated with 0.25% Marcaine with  epinephrine,  1% lidocaine jelly was  placed inside the rectum with a small piece of Gelfoam and sterile dressings  were applied. The patient tolerated the procedure well. The patient was then  moved back into a supine position on the stretcher, he was awakened and  taken to recovery room in stable condition.       PST/MEDQ  D:  10/06/2004  T:  10/06/2004  Job:  [id number]

## 2010-09-01 NOTE — Discharge Summary (Signed)
NAMEFOCH, ROSENWALD              ACCOUNT NO.:  [id number]   MEDICAL RECORD NO.:  [id number]          PATIENT TYPE:  INP   LOCATION:  A310                          FACILITY:  [facility]   PHYSICIAN:  Patrica Duel, M.D.    DATE OF BIRTH:  [birth year]   DATE OF ADMISSION:  10/12/2004  DATE OF DISCHARGE:  07/01/2006LH                                 DISCHARGE SUMMARY   DISCHARGE DIAGNOSES:  1.  Significant dehydration and prerenal azotemia most likely secondary to      urinary tract infection/prostatitis.  2.  Recent hemorrhoidectomy and surgical correction of rectal prolapse      without complications except for #1.  3.  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  4.  Atherosclerotic cardiovascular disease status post coronary aorto bypass      grafting in 2000.  5.  History of low back surgery.  6.  History of cholecystectomy.  7.  Hiatal hernia.  8.  Hypertension which has been well controlled.   For details regarding admission, please refer to the admitting note.  Briefly, this 65 year old male with the above history underwent a  hemorrhoidectomy and repair of rectal prolapse 5 days prior to presenting to  our emergency department.  While in the hospital for his surgical  procedures, he had a Foley catheter and noted significant burning sensation.  Upon discharge and discontinuation of the Foley, he continued to experience  severe dysuria and soon developed fever.  He also had nausea and vomiting.  Postop followup by surgery the day of admission revealed no surgical  complications.  He was given Cipro for urinary tract infection.   The patient continued to experience nausea, vomiting, increasingly severe  weakness and presented to our emergency department.  He was found to be  dehydrated.  His CBC was normal.  BUN and creatinine were 21 and 1.7  (baseline creatinine 1).  Urine specific gravity is  greater than 1.030.  Urine revealed positive nitrite, rare bacteria and positive urobilinogen.   Patient was admitted with dehydration and fever related to urinary tract  infection.   COURSE IN THE HOSPITAL:  Patient has done very well in the hospital.  His  laboratory parameters have improved.  His urine output is normal as are his  renal functions.  He experienced a temperature of 102 approximately 24 hours  ago.  His  Cipro was changed to Levaquin.  He has since defervesced and  feels much better and is requesting discharge.  His appetite is excellent.  He will be held until lunch to assure he is continuing to retain his diet.  He will be followed and treated expectantly as an outpatient.   DISPOSITION:  Levaquin 500 once daily and Phenergan p.r.n.  He will continue  his home meds which include lisinopril and Prilosec and other cardiac meds  which are not available at this time.  He will be followed and treated  expectantly.       MC/MEDQ  D:  10/14/2004  T:  10/14/2004  Job:  [id number]

## 2010-09-01 NOTE — Procedures (Signed)
NAME:  ALEK, BORGES                        ACCOUNT NO.:  [id number]   MEDICAL RECORD NO.:  [id number]                  PATIENT TYPE:   LOCATION:                                       FACILITY:  [facility]   PHYSICIAN:  Vida Roller, M.D.                DATE OF BIRTH:  April 13, 1946   DATE OF PROCEDURE:  06/08/2003  DATE OF DISCHARGE:                                    STRESS TEST   INDICATION:  Mr. Whipp is a 65 year old male with known coronary artery  disease, status post coronary artery bypass graft in 2000 with the following  grafts:  LIMA to LAD, RIMA to OM1, SVG to OM2, SVG to PD.  Normal LV  function at that time.  He now presents with recurrent chest discomfort  which is atypical for ischemia.   BASELINE DATA:  EKG revealed sinus rhythm at 57 beats per minute with PAC's.  Blood pressure 138/78.   The patient exercised for a total of nine minutes, 38 seconds to Bruce  protocol stage 3.  Maximum heart rate was 153 beats per minute which is 93%  of predicted maximum.  Maximum blood pressure 190/98 which resolved down to  148/82 in recovery.   EKG revealed a few PAC's and ST depression with T-wave inversion in leads V3  through V5 during exercise which resolved in recovery.  The patient denied  any chest discomfort or any other symptoms.  The test was stopped secondary  to fatigue.   FINAL IMAGES AND RESULTS:  Pending M.D. review.     ________________________________________  ___________________________________________  Jae Dire, P.A. [facility]                      Vida Roller, M.D.   AB/MEDQ  D:  06/08/2003  T:  06/08/2003  Job:  [id number]

## 2010-10-24 ENCOUNTER — Encounter: Payer: Self-pay | Admitting: Cardiology

## 2011-01-10 LAB — DIFFERENTIAL
Eosinophils Relative: 0
Lymphocytes Relative: 23
Lymphs Abs: 1.6
Monocytes Absolute: 0.5

## 2011-01-10 LAB — BASIC METABOLIC PANEL
GFR calc non Af Amer: 54 — ABNORMAL LOW
Glucose, Bld: 105 — ABNORMAL HIGH
Potassium: 4.4
Sodium: 138

## 2011-01-10 LAB — CBC
HCT: 41.7
Hemoglobin: 14.6
RBC: 4.38
WBC: 7.1

## 2011-04-03 ENCOUNTER — Encounter: Payer: Self-pay | Admitting: Physician Assistant

## 2011-04-04 ENCOUNTER — Ambulatory Visit (INDEPENDENT_AMBULATORY_CARE_PROVIDER_SITE_OTHER): Payer: Managed Care, Other (non HMO) | Admitting: Physician Assistant

## 2011-04-04 ENCOUNTER — Encounter: Payer: Self-pay | Admitting: Physician Assistant

## 2011-04-04 VITALS — BP 142/82 | HR 56 | Ht 70.0 in | Wt 181.1 lb

## 2011-04-04 DIAGNOSIS — E782 Mixed hyperlipidemia: Secondary | ICD-10-CM

## 2011-04-04 DIAGNOSIS — I10 Essential (primary) hypertension: Secondary | ICD-10-CM

## 2011-04-04 DIAGNOSIS — I2510 Atherosclerotic heart disease of native coronary artery without angina pectoris: Secondary | ICD-10-CM

## 2011-04-04 DIAGNOSIS — I8390 Asymptomatic varicose veins of unspecified lower extremity: Secondary | ICD-10-CM

## 2011-04-04 DIAGNOSIS — E785 Hyperlipidemia, unspecified: Secondary | ICD-10-CM

## 2011-04-04 NOTE — Patient Instructions (Addendum)
Your physician recommends that you schedule a follow-up appointment in: 1 year with Dr Dietrich Pates  Your physician recommends that you return for lab work in: This week if possible

## 2011-04-04 NOTE — Assessment & Plan Note (Signed)
Blood pressure stable ? ?

## 2011-04-04 NOTE — Assessment & Plan Note (Signed)
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 It bothered him for a couple days when he was standing on a concrete floor for long periods of time. He can exercise and high and without any symptoms. He does not want to do anything at this time because he is asymptomatic. I asked him to call if it becomes bothersome again and we can refer him to the vein clinic

## 2011-04-04 NOTE — Progress Notes (Signed)
HPI:  This is a 65 year old white male patient who is here for his yearly followup for continued assessment and treatment of coronary artery disease status post CABG x4 in 2000. He also has hyperlipidemia. His stress test 6 years ago that was normal.  The patient remains active hunting on a regular basis. He does denies any chest pain, palpitations, dyspnea, dyspnea on exertion, dizziness, or presyncope. He does have a varicose vein on his left lower leg behind his left knee that bothered him when he was standing on a concrete floor for several days at a time. It would ache at night but this has resolved. 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  Allergies  Allergen Reactions  . Codeine   . Nalbuphine     Current Outpatient Prescriptions on File Prior to Visit  Medication Sig Dispense Refill  . aspirin 81 MG EC tablet Take 81 mg by mouth daily.        . Coenzyme Q10 (COQ10) 100 MG CAPS Take 1 capsule by mouth daily.        Marland Kitchen ezetimibe-simvastatin (VYTORIN) 10-20 MG per tablet Take 1 tablet by mouth daily.        Marland Kitchen lisinopril (PRINIVIL,ZESTRIL) 10 MG tablet Take 10 mg by mouth daily.        . Multiple Vitamin (MULTIVITAMIN) tablet Take 1 tablet by mouth daily.        . NON FORMULARY prosvent - 1 tablet daily       . Omega-3 Fatty Acids (FISH OIL) 1000 MG CAPS Take 1 capsule by mouth daily.        . Omeprazole (EQL OMEPRAZOLE) 20 MG TBEC Take 1 tablet by mouth 2 (two) times daily.        . sildenafil (VIAGRA) 50 MG tablet Take 50 mg by mouth as directed.        . Tamsulosin HCl (FLOMAX) 0.4 MG CAPS Take 0.4 mg by mouth daily.          Past Medical History  Diagnosis Date  . ASCVD (arteriosclerotic cardiovascular disease)     CABG in 5/00  . GERD (gastroesophageal reflux disease)     s/p espohageal  dilatatin and repair of hiatal hernia  . HTN (hypertension)   . HLD (hyperlipidemia)   . BPH (benign prostatic hypertrophy)     h/x of prostatis/ UTI in 2006  . Hiatal hernia   . Sigmoid diverticulosis     Past Surgical History  Procedure Date  . Cholecystectomy 1990s  . Hiatal hernia repair 1995  . Lumbar laminectomy 1999  . Internal hemorrhoidectomy 2007    No family history on file.  History   Social History  . Marital Status: Married    Spouse Name: N/A    Number of Children: N/A  . Years of Education: N/A   Occupational History  . Not on file.   Social History Main Topics  . Smoking status: Unknown If Ever Smoked  . Smokeless tobacco: Not on file   Comment: no use of tobacco products  . Alcohol Use: No     no excessive alcohol   . Drug Use: No  . Sexually Active: Not on file   Other Topics Concern  . Not on file   Social History Narrative  Previously  Employed as an Public affairs consultant.     ROS: See HPI Eyes: Negative Ears:Negative for hearing loss, tinnitus Cardiovascular: Negative for chest pain, palpitations,irregular heartbeat, dyspnea, dyspnea on exertion, near-syncope, orthopnea, paroxysmal nocturnal dyspnia and syncope,edema, claudication, cyanosis,.  Respiratory:   Negative for cough, hemoptysis, shortness of breath, sleep disturbances due to breathing, sputum production and wheezing.   Endocrine: Negative for cold intolerance and heat intolerance.  Hematologic/Lymphatic: Negative for adenopathy and bleeding problem. Does not bruise/bleed easily.  Musculoskeletal: Negative.   Gastrointestinal: Negative for nausea, vomiting, reflux, abdominal pain, diarrhea, constipation.   Neurological: Negative.  Allergic/Immunologic: Negative for environmental allergies.   PHYSICAL EXAM: Well-nournished, in no acute distress. Neck: No JVD, HJR, Bruit, or thyroid enlargement Lungs: No tachypnea, clear without wheezing, rales, or rhonchi Cardiovascular:  RRR, PMI not displaced,positive S4 and 2/6 systolic murmur at the left sternal border, no bruit, thrill, or heave. Abdomen: BS normal. Soft without organomegaly, masses, lesions or tenderness. Extremities: varicose vein in the right lower extremity behind the right knee,without cyanosis, clubbing or edema. Good distal pulses bilateral SKin: Warm, no lesions or rashes  Musculoskeletal: No deformities Neuro: no focal signs  BP 142/82  Pulse 56  Ht 5\' 10"  (1.778 m)  Wt 181 lb 1.6 oz (82.146 kg)  BMI 25.99 kg/m2  ZOX:WRUEA bradycardia at 57 beats per minute with incomplete right bundle branch block

## 2011-04-04 NOTE — Assessment & Plan Note (Signed)
Patient has not had his last stroke checked in a year. We will check a fasting lipid panel and LFTs.

## 2011-04-04 NOTE — Assessment & Plan Note (Signed)
Patient is doing well without any symptoms. Continue current medications.

## 2011-04-06 LAB — LIPID PANEL
Total CHOL/HDL Ratio: 3.3 Ratio
VLDL: 23 mg/dL (ref 0–40)

## 2011-04-08 ENCOUNTER — Other Ambulatory Visit: Payer: Self-pay | Admitting: Adult Health

## 2011-04-11 ENCOUNTER — Telehealth: Payer: Self-pay | Admitting: *Deleted

## 2011-04-11 NOTE — Telephone Encounter (Signed)
Lab results called to patient

## 2011-05-16 ENCOUNTER — Other Ambulatory Visit: Payer: Self-pay | Admitting: Dermatology

## 2011-12-03 ENCOUNTER — Other Ambulatory Visit: Payer: Self-pay | Admitting: Urology

## 2012-01-01 ENCOUNTER — Telehealth: Payer: Self-pay

## 2012-01-01 ENCOUNTER — Other Ambulatory Visit: Payer: Self-pay

## 2012-01-01 DIAGNOSIS — Z139 Encounter for screening, unspecified: Secondary | ICD-10-CM

## 2012-01-01 NOTE — Telephone Encounter (Signed)
Gastroenterology Pre-Procedure Form  Triaged by Ginger   Request Date: 12/31/2011      Requesting Physician: Recall ( Last TCS 11/05/2001 by RMR)     PATIENT INFORMATION:  [name] is a 66 y.o., male (DOB=1946/01/16).  PROCEDURE: Procedure(s) requested: colonoscopy Procedure Reason: screening for colon cancer  PATIENT REVIEW QUESTIONS: The patient reports the following:   1. Diabetes Melitis: no 2. Joint replacements in the past 12 months: no 3. Major health problems in the past 3 months: no 4. Has an artificial valve or MVP:no 5. Has been advised in past to take antibiotics in advance of a procedure like teeth cleaning: no}    MEDICATIONS & ALLERGIES:    Patient reports the following regarding taking any blood thinners:   Plavix? no Aspirin?yes  Coumadin?  no  Patient confirms/reports the following medications:  Current Outpatient Prescriptions  Medication Sig Dispense Refill  . aspirin 81 MG EC tablet Take 81 mg by mouth daily.        . Coenzyme Q10 (COQ10) 100 MG CAPS Take 1 capsule by mouth daily.        Marland Kitchen lisinopril (PRINIVIL,ZESTRIL) 10 MG tablet Take 10 mg by mouth daily.        . Multiple Vitamin (MULTIVITAMIN) tablet Take 1 tablet by mouth daily.        . Omeprazole (EQL OMEPRAZOLE) 20 MG TBEC Take 1 tablet by mouth 2 (two) times daily.        . sildenafil (VIAGRA) 50 MG tablet Take 50 mg by mouth as directed.        . Tamsulosin HCl (FLOMAX) 0.4 MG CAPS Take 0.4 mg by mouth daily.        Marland Kitchen VYTORIN 10-20 MG per tablet TAKE 1 TABLET DAILY  90 tablet  3  . NON FORMULARY prosvent - 1 tablet daily       . Omega-3 Fatty Acids (FISH OIL) 1000 MG CAPS Take 1 capsule by mouth daily.          Patient confirms/reports the following allergies:  Allergies  Allergen Reactions  . Codeine   . Nalbuphine     Patient is appropriate to schedule for requested procedure(s): yes  AUTHORIZATION INFORMATION Primary Insurance:   ID #:  Group #:  Pre-Cert / Auth  required: Pre-Cert / Auth #:   Secondary Insurance:  ID #:   Group #:  Pre-Cert / Auth required:  Pre-Cert / Auth #:   No orders of the defined types were placed in this encounter.    SCHEDULE INFORMATION: Procedure has been scheduled as follows:  Date: 01/28/2012          Time: 9:30 AM  Location: [facility] Short Stay  This Gastroenterology Pre-Precedure Form is being routed to the following provider(s) for review: R. Roetta Sessions, MD

## 2012-01-01 NOTE — Telephone Encounter (Signed)
OK to proceed with colonoscopy but will need to be re-triaged within 30-day window.  Thanks

## 2012-01-01 NOTE — Telephone Encounter (Signed)
Pt called back and rescheduled to 02/04/2012 at 7:30 AM. Selena Batten is aware. ( he is on my update triage schedule).

## 2012-01-02 MED ORDER — PEG-KCL-NACL-NASULF-NA ASC-C 100 G PO SOLR: 1.0000 | ORAL | Status: DC

## 2012-01-02 NOTE — Telephone Encounter (Signed)
Rx sent to [facility] in [place]. Instructions mailed to pt.  

## 2012-01-11 ENCOUNTER — Telehealth: Payer: Self-pay

## 2012-01-11 NOTE — Telephone Encounter (Signed)
I CALLED AND SPOKE TO JUSTIN R. AT [phone]. NO PRECERT REQUIRED FOR SCREENING COLONOSCOPY.

## 2012-01-28 ENCOUNTER — Telehealth: Payer: Self-pay

## 2012-01-28 ENCOUNTER — Encounter (HOSPITAL_COMMUNITY): Payer: Self-pay

## 2012-01-28 NOTE — Telephone Encounter (Signed)
LMOM to call and update triage.

## 2012-01-29 NOTE — Telephone Encounter (Signed)
LMOM to call and reschedule colonoscopy.  

## 2012-01-29 NOTE — Telephone Encounter (Signed)
Pt returned call to update triage for his colonoscopy on 02/04/2012. The only change, he has been on Bactrim 2 weeks ( will complete in a couple of days) for prostatitis. No change in his other meds.

## 2012-01-29 NOTE — Telephone Encounter (Signed)
OK, pt should wait a few weeks after prostatitis treated to schedule colonoscopy. OK fo Colonoscopy November.

## 2012-01-30 NOTE — Telephone Encounter (Signed)
Pt returned call.  I told him it was recommended that he wait til NOV to make sure his prostate infection has cleared. He said, actually, he now thinks he might have a UTI. He will just cancel appt for 02/04/2012 and will call me next week and let me know how things are going. He will probably just schedule for Dec since he doesn't have a lot of days that he could do in [facility] I took him off of the schedule and LMOM for Selena Batten to cancel him out.

## 2012-02-04 ENCOUNTER — Telehealth: Payer: Self-pay | Admitting: *Deleted

## 2012-02-04 ENCOUNTER — Encounter (HOSPITAL_COMMUNITY): Admission: RE | Payer: Self-pay | Source: Ambulatory Visit

## 2012-02-04 ENCOUNTER — Ambulatory Visit (HOSPITAL_COMMUNITY)
Admission: RE | Admit: 2012-02-04 | Payer: Managed Care, Other (non HMO) | Source: Ambulatory Visit | Admitting: Internal Medicine

## 2012-02-04 ENCOUNTER — Other Ambulatory Visit: Payer: Self-pay

## 2012-02-04 DIAGNOSIS — Z139 Encounter for screening, unspecified: Secondary | ICD-10-CM

## 2012-02-04 SURGERY — COLONOSCOPY
Anesthesia: Moderate Sedation

## 2012-02-04 NOTE — Telephone Encounter (Signed)
[name] called you today. He would like for you to return his call. Thanks.

## 2012-02-04 NOTE — Telephone Encounter (Signed)
Called pt. He is now scheduled for colonoscopy on 03/17/2012 with RMR at 7:30. He is on my recall to update triage. He had prostatitis and had to postpone for awhile.

## 2012-03-04 ENCOUNTER — Encounter (HOSPITAL_COMMUNITY): Payer: Self-pay | Admitting: Pharmacy Technician

## 2012-03-10 ENCOUNTER — Telehealth: Payer: Self-pay

## 2012-03-10 NOTE — Telephone Encounter (Signed)
Gastroenterology Pre-Procedure Form    Request Date: 03/10/2012     Requesting Physician: RMR  (PT WAS ON RECALL FOR NEXT COLONOSCOPY) Had been previously triaged/ had to wait til prostatitis cleared up. Pt said it is clear and he is doing fine now.      PATIENT INFORMATION:  [name] is a 66 y.o., male (DOB=1945-04-27).  PROCEDURE: Procedure(s) requested: colonoscopy Procedure Reason: screening for colon cancer  PATIENT REVIEW QUESTIONS: The patient reports the following:   1. Diabetes Melitis: no 2. Joint replacements in the past 12 months: no 3. Major health problems in the past 3 months: no 4. Has an artificial valve or MVP:no 5. Has been advised in past to take antibiotics in advance of a procedure like teeth cleaning: no}    MEDICATIONS & ALLERGIES:    Patient reports the following regarding taking any blood thinners:   Plavix? no Aspirin?yes  Coumadin?  no  Patient confirms/reports the following medications:  Current Outpatient Prescriptions  Medication Sig Dispense Refill  . aspirin 81 MG EC tablet Take 81 mg by mouth daily.        . Coenzyme Q10 (CO Q 10) 100 MG CAPS Take 100 mg by mouth daily.      Marland Kitchen ezetimibe-simvastatin (VYTORIN) 10-20 MG per tablet Take 1 tablet by mouth at bedtime.      Marland Kitchen ibuprofen (ADVIL,MOTRIN) 200 MG tablet Take 200 mg by mouth every 6 (six) hours as needed. Pain      . lisinopril (PRINIVIL,ZESTRIL) 10 MG tablet Take 10 mg by mouth daily.        . Multiple Vitamin (ONE-A-DAY ESSENTIAL) TABS Take 1 tablet by mouth daily.      Marland Kitchen omeprazole (PRILOSEC) 20 MG capsule Take 20 mg by mouth 2 (two) times daily.      . Tamsulosin HCl (FLOMAX) 0.4 MG CAPS Take 0.4 mg by mouth daily.        . peg 3350 powder (MOVIPREP) 100 G SOLR Take 1 kit (100 g total) by mouth as directed.  1 kit  0    Patient confirms/reports the following allergies:  Allergies  Allergen Reactions  . Codeine Nausea And Vomiting  . Demerol (Meperidine) Nausea And Vomiting    . Nalbuphine Nausea And Vomiting    Patient is appropriate to schedule for requested procedure(s): yes  AUTHORIZATION INFORMATION Primary Insurance: ,ID #:   Group  Pre-Cert / Auth required: Pre-Cert / Auth #:   Secondary Insurance:   ID #:  Group #:  Pre-Cert / Auth required:  Pre-Cert / Auth #:   No orders of the defined types were placed in this encounter.    SCHEDULE INFORMATION: Procedure has been scheduled as follows:  Date: 03/17/2012        Time: 7:30 AM Location: [facility] Short Stay  This Gastroenterology Pre-Precedure Form is being routed to the following provider(s) for review: R. Roetta Sessions, MD    Pt has received his instructions and prep.

## 2012-03-10 NOTE — Telephone Encounter (Signed)
OK to proceed with colonoscopy.

## 2012-03-11 ENCOUNTER — Other Ambulatory Visit: Payer: Self-pay | Admitting: Physician Assistant

## 2012-03-17 ENCOUNTER — Encounter (HOSPITAL_COMMUNITY)
Admission: RE | Disposition: A | Discharge status: Home / Self Care | Payer: Self-pay | Source: Ambulatory Visit | Attending: Internal Medicine

## 2012-03-17 ENCOUNTER — Encounter (HOSPITAL_COMMUNITY): Payer: Self-pay | Admitting: *Deleted

## 2012-03-17 ENCOUNTER — Ambulatory Visit (HOSPITAL_COMMUNITY)
Admission: RE | Admit: 2012-03-17 | Discharge: 2012-03-17 | Disposition: A | Discharge status: Home / Self Care | Payer: Managed Care, Other (non HMO) | Source: Ambulatory Visit | Attending: Internal Medicine | Admitting: Internal Medicine

## 2012-03-17 DIAGNOSIS — I10 Essential (primary) hypertension: Secondary | ICD-10-CM | POA: Insufficient documentation

## 2012-03-17 DIAGNOSIS — Z1211 Encounter for screening for malignant neoplasm of colon: Secondary | ICD-10-CM

## 2012-03-17 DIAGNOSIS — K5730 Diverticulosis of large intestine without perforation or abscess without bleeding: Secondary | ICD-10-CM | POA: Insufficient documentation

## 2012-03-17 DIAGNOSIS — Z139 Encounter for screening, unspecified: Secondary | ICD-10-CM

## 2012-03-17 HISTORY — PX: COLONOSCOPY: SHX5424

## 2012-03-17 HISTORY — DX: Other specified postprocedural states: Z98.890

## 2012-03-17 HISTORY — DX: Nausea with vomiting, unspecified: R11.2

## 2012-03-17 SURGERY — COLONOSCOPY
Anesthesia: Moderate Sedation

## 2012-03-17 MED ORDER — ONDANSETRON HCL 4 MG/2ML IJ SOLN
INTRAMUSCULAR | Status: AC
  Filled 2012-03-17: qty 2

## 2012-03-17 MED ORDER — FENTANYL CITRATE 0.05 MG/ML IJ SOLN
INTRAMUSCULAR | Status: AC
  Filled 2012-03-17: qty 4

## 2012-03-17 MED ORDER — ONDANSETRON HCL 4 MG/2ML IJ SOLN
INTRAMUSCULAR | Status: DC | PRN
  Administered 2012-03-17: 4 mg via INTRAVENOUS

## 2012-03-17 MED ORDER — STERILE WATER FOR IRRIGATION IR SOLN
Status: DC | PRN
  Administered 2012-03-17: 08:00:00

## 2012-03-17 MED ORDER — FENTANYL CITRATE 0.05 MG/ML IJ SOLN
INTRAMUSCULAR | Status: DC | PRN
  Administered 2012-03-17: 25 ug via INTRAVENOUS
  Administered 2012-03-17: 50 ug via INTRAVENOUS

## 2012-03-17 MED ORDER — SODIUM CHLORIDE 0.45 % IV SOLN
INTRAVENOUS | Status: DC
  Administered 2012-03-17: 07:00:00 via INTRAVENOUS

## 2012-03-17 MED ORDER — MIDAZOLAM HCL 5 MG/5ML IJ SOLN
INTRAMUSCULAR | Status: DC | PRN
  Administered 2012-03-17: 1 mg via INTRAVENOUS
  Administered 2012-03-17 (×2): 2 mg via INTRAVENOUS

## 2012-03-17 MED ORDER — MIDAZOLAM HCL 5 MG/5ML IJ SOLN
INTRAMUSCULAR | Status: AC
  Filled 2012-03-17: qty 10

## 2012-03-17 NOTE — Op Note (Signed)
[facility] [address] [place], [address]   COLONOSCOPY PROCEDURE REPORT  PATIENT: [name], [name]  MR#:         [id number] BIRTHDATE: 22-Jun-1945 , 66  yrs. old GENDER: Male ENDOSCOPIST: R.  Roetta Sessions, MD FACP FACG REFERRED BY:  Assunta Found, M.D. PROCEDURE DATE:  03/17/2012 PROCEDURE:     Screening colonoscopy  INDICATIONS: Average risk colorectal cancer screening  INFORMED CONSENT:  The risks, benefits, alternatives and imponderables including but not limited to bleeding, perforation as well as the possibility of a missed lesion have been reviewed.  The potential for biopsy, lesion removal, etc. have also been discussed.  Questions have been answered.  All parties agreeable. Please see the history and physical in the medical record for more information.  MEDICATIONS: fentanyl 75 mcg and Versed 5 mg IV in divided doses. Zofran 4 mg IV for nausea prophylaxis.  DESCRIPTION OF PROCEDURE:  After a digital rectal exam was performed, the Pentax Colonoscope [phone]  colonoscope was advanced from the anus through the rectum and colon to the area of the cecum, ileocecal valve and appendiceal orifice.  The cecum was deeply intubated.  These structures were well-seen and photographed for the record.  From the level of the cecum and ileocecal valve, the scope was slowly and cautiously withdrawn.  The mucosal surfaces were carefully surveyed utilizing scope tip deflection to facilitate fold flattening as needed.  The scope was pulled down into the rectum where a thorough examination including retroflexion was performed.    FINDINGS:  Adequate preparation. Some scarring of the rectal mucosa distally just inside the anal verge consistent with multiple prior hemorrhoid procedures; The remainder of the rectal mucosa appeared normal. Left-sided diverticula; the remainder of the colonic mucosa appeared normal.  THERAPEUTIC / DIAGNOSTIC MANEUVERS PERFORMED:   None  COMPLICATIONS: None  CECAL WITHDRAWAL TIME:  11 minutes  IMPRESSION:  Colonic diverticulosis  RECOMMENDATIONS: Repeat screening colonoscopy in 10 years   _______________________________ eSigned:  R. Roetta Sessions, MD FACP [facility] 03/17/2012 8:12 AM   CC:

## 2012-03-17 NOTE — H&P (Signed)
Primary Care Physician:  Colette Ribas, MD Primary Gastroenterologist:  Dr. Jena Gauss  Pre-Procedure History & Physical: HPI:  [name] is a 66 y.o. male is here for a screening colonoscopy.  Last colonoscopy in 10 years -- diverticulosis. No bowel symptoms. No family history of colon cancer or colon polyps.  Past Medical History  Diagnosis Date  . ASCVD (arteriosclerotic cardiovascular disease)     CABG in 5/00  . GERD (gastroesophageal reflux disease)     s/p espohageal dilatatin and repair of hiatal hernia  . HTN (hypertension)   . HLD (hyperlipidemia)   . BPH (benign prostatic hypertrophy)     h/x of prostatis/ UTI in 2006  . Hiatal hernia   . Sigmoid diverticulosis   . PONV (postoperative nausea and vomiting)     Past Surgical History  Procedure Date  . Cholecystectomy 1990s  . Hiatal hernia repair 1995  . Lumbar laminectomy 1999  . Internal hemorrhoidectomy 2007  . Coronary artery bypass graft 2000    Prior to Admission medications   Medication Sig Start Date End Date Taking? Authorizing Provider  aspirin 81 MG EC tablet Take 81 mg by mouth daily.     Yes Historical Provider, MD  Coenzyme Q10 (CO Q 10) 100 MG CAPS Take 100 mg by mouth daily.   Yes Historical Provider, MD  ezetimibe-simvastatin (VYTORIN) 10-20 MG per tablet Take 1 tablet by mouth at bedtime.   Yes Historical Provider, MD  ibuprofen (ADVIL,MOTRIN) 200 MG tablet Take 200 mg by mouth every 6 (six) hours as needed. Pain   Yes Historical Provider, MD  lisinopril (PRINIVIL,ZESTRIL) 10 MG tablet Take 10 mg by mouth daily.     Yes Historical Provider, MD  Multiple Vitamin (ONE-A-DAY ESSENTIAL) TABS Take 1 tablet by mouth daily.   Yes Historical Provider, MD  omeprazole (PRILOSEC) 20 MG capsule Take 20 mg by mouth 2 (two) times daily.   Yes Historical Provider, MD  peg 3350 powder (MOVIPREP) 100 G SOLR Take 1 kit (100 g total) by mouth as directed. 01/02/12  Yes Corbin Ade, MD  Tamsulosin HCl  (FLOMAX) 0.4 MG CAPS Take 0.4 mg by mouth daily.     Yes Historical Provider, MD  VYTORIN 10-20 MG per tablet TAKE 1 TABLET DAILY 03/11/12   Dyann Kief, PA    Allergies as of 02/04/2012 - Review Complete 01/28/2012  Allergen Reaction Noted  . Codeine Other (See Comments) 04/05/2009  . Nalbuphine Other (See Comments) 04/05/2009    Family History  Problem Relation Age of Onset  . Colon cancer Neg Hx     History   Social History  . Marital Status: Married    Spouse Name: N/A    Number of Children: N/A  . Years of Education: N/A   Occupational History  . Not on file.   Social History Main Topics  . Smoking status: Never Smoker   . Smokeless tobacco: Not on file     Comment: no use of tobacco products  . Alcohol Use: Yes     Comment: "Occasionally"  . Drug Use: No  . Sexually Active: Not on file   Other Topics Concern  . Not on file   Social History Narrative   Previously  Employed as an Public affairs consultant.     Review of Systems: See HPI, otherwise negative ROS  Physical Exam: BP 147/73  Pulse 64  Temp 97.9 F (36.6 C) (Oral)  Resp 12  Ht 5\' 10"  (1.778 m)  Wt  181 lb (82.101 kg)  BMI 25.97 kg/m2  SpO2 97% General:   Alert,  Well-developed, well-nourished, pleasant and cooperative in NAD Head:  Normocephalic and atraumatic. Eyes:  Sclera clear, no icterus.   Conjunctiva pink. Ears:  Normal auditory acuity. Nose:  No deformity, discharge,  or lesions. Mouth:  No deformity or lesions, dentition normal. Neck:  Supple; no masses or thyromegaly. Lungs:  Clear throughout to auscultation.   No wheezes, crackles, or rhonchi. No acute distress. Heart:  Regular rate and rhythm; no murmurs, clicks, rubs,  or gallops. Abdomen:  Soft, nontender and nondistended. No masses, hepatosplenomegaly or hernias noted. Normal bowel sounds, without guarding, and without rebound.   Msk:  Symmetrical without gross deformities. Normal posture. Pulses:  Normal pulses  noted. Extremities:  Without clubbing or edema. Neurologic:  Alert and  oriented x4;  grossly normal neurologically. Skin:  Intact without significant lesions or rashes. Cervical Nodes:  No significant cervical adenopathy. Psych:  Alert and cooperative. Normal mood and affect.  Impression/Plan: [name] is now here to undergo a screening colonoscopy.  Average risk screening examination.  Risks, benefits, limitations, imponderables and alternatives regarding colonoscopy have been reviewed with the patient. Questions have been answered. All parties agreeable.

## 2012-03-18 ENCOUNTER — Encounter (HOSPITAL_COMMUNITY): Payer: Self-pay | Admitting: Internal Medicine

## 2012-04-04 ENCOUNTER — Ambulatory Visit: Payer: Managed Care, Other (non HMO) | Admitting: Cardiology

## 2012-04-14 ENCOUNTER — Ambulatory Visit (INDEPENDENT_AMBULATORY_CARE_PROVIDER_SITE_OTHER): Payer: Managed Care, Other (non HMO) | Admitting: Cardiology

## 2012-04-14 ENCOUNTER — Encounter: Payer: Self-pay | Admitting: Cardiology

## 2012-04-14 VITALS — BP 150/80 | HR 67 | Ht 70.0 in | Wt 185.4 lb

## 2012-04-14 DIAGNOSIS — I2510 Atherosclerotic heart disease of native coronary artery without angina pectoris: Secondary | ICD-10-CM

## 2012-04-14 DIAGNOSIS — I10 Essential (primary) hypertension: Secondary | ICD-10-CM | POA: Insufficient documentation

## 2012-04-14 DIAGNOSIS — E785 Hyperlipidemia, unspecified: Secondary | ICD-10-CM

## 2012-04-14 DIAGNOSIS — Z951 Presence of aortocoronary bypass graft: Secondary | ICD-10-CM | POA: Insufficient documentation

## 2012-04-14 DIAGNOSIS — K219 Gastro-esophageal reflux disease without esophagitis: Secondary | ICD-10-CM | POA: Insufficient documentation

## 2012-04-14 DIAGNOSIS — N400 Enlarged prostate without lower urinary tract symptoms: Secondary | ICD-10-CM | POA: Insufficient documentation

## 2012-04-14 DIAGNOSIS — I7090 Unspecified atherosclerosis: Secondary | ICD-10-CM

## 2012-04-14 DIAGNOSIS — N189 Chronic kidney disease, unspecified: Secondary | ICD-10-CM

## 2012-04-14 NOTE — Patient Instructions (Addendum)
Your physician recommends that you schedule a follow-up appointment in: 1 year  Your physician recommends that you return for lab work in: Today

## 2012-04-14 NOTE — Progress Notes (Signed)
Patient ID: [name], male   DOB: Jan 26, 1946, 66 y.o.   MRN: [id number]  HPI: Annual visit for this very nice gentleman with coronary artery disease.  Since CABG surgery 13 years ago, he has experienced no cardiac problems.  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  A recent lipid profile performed work was excellent.  All blood pressure determinations prior to today have been normal.  Prior to Admission medications   Medication Sig Start Date End Date Taking? Authorizing Provider  aspirin 81 MG EC tablet Take 81 mg by mouth daily.     Yes Historical Provider, MD  Coenzyme Q10 (CO Q 10) 100 MG CAPS Take 100 mg by mouth daily.   Yes Historical Provider, MD  ibuprofen (ADVIL,MOTRIN) 200 MG tablet Take 200 mg by mouth every 6 (six) hours as needed. Pain   Yes Historical Provider, MD  lisinopril (PRINIVIL,ZESTRIL) 10 MG tablet Take 10 mg by mouth daily.     Yes Historical Provider, MD  Multiple Vitamin (ONE-A-DAY ESSENTIAL) TABS Take 1 tablet by mouth daily.   Yes Historical Provider, MD  omeprazole (PRILOSEC) 20 MG capsule Take 20 mg by mouth 2 (two) times daily.   Yes Historical Provider, MD  sildenafil (VIAGRA) 50 MG tablet Take 50 mg by mouth daily as needed.   Yes Historical Provider, MD  Tamsulosin HCl (FLOMAX) 0.4 MG CAPS Take 0.4 mg by mouth daily.     Yes Historical Provider, MD  VYTORIN 10-20 MG per tablet TAKE 1 TABLET DAILY 03/11/12  Yes Dyann Kief, PA   Allergies  Allergen Reactions  . Codeine Nausea And Vomiting  . Demerol (Meperidine) Nausea And Vomiting  . Nalbuphine Nausea And Vomiting     Past medical history, social history, and family history reviewed and updated.  ROS: Denies chest pain, dyspnea, orthopnea, PND, palpitations, lightheadedness or syncope.  All other systems reviewed and are negative.  PHYSICAL EXAM: BP 150/80  Pulse 67  Ht 5\' 10"  (1.778 m)  Wt 84.097 kg (185 lb 6.4 oz)  BMI  26.60 kg/m2  SpO2 97%  General-Well developed; no acute distress Body habitus-mildly overweight Neck-No JVD; no carotid bruits Lungs-clear lung fields; resonant to percussion Cardiovascular-normal PMI; normal S1 and S2; median sternotomy scar contiguous with midline abdominal surgical scar Abdomen-normal bowel sounds; soft and non-tender without masses or organomegaly Musculoskeletal-No deformities, no cyanosis or clubbing Neurologic-Normal cranial nerves; symmetric strength and tone Skin-Warm, no significant lesions Extremities-distal pulses intact; no edema  ASSESSMENT AND PLAN:  Roseto Bing, MD 04/14/2012 4:55 PM

## 2012-04-14 NOTE — Assessment & Plan Note (Addendum)
Occasional systolic blood pressure to 150 during the past 3 years.  Patient will continue to monitor and will report any sustained elevation in blood pressure.

## 2012-04-14 NOTE — Assessment & Plan Note (Addendum)
Stable mild renal dysfunction.  Patient has limited use of nonsteroidals to the extent possible.  He has been treated with low to moderate dose lisinopril, which may be resulting in some increase in creatinine, but is the appropriate therapy for him.  We will continue to monitor renal function.

## 2012-04-14 NOTE — Assessment & Plan Note (Signed)
The patient has done extremely well, now more than a decade following CABG surgery.  We will continue to optimally treat cardiovascular risk factors.

## 2012-04-14 NOTE — Assessment & Plan Note (Signed)
Excellent lipid profile since therapy instituted.  Current medication will be continued.

## 2012-04-14 NOTE — Progress Notes (Deleted)
Name: [name]    DOB: [birth year]  Age: 66 y.o.  MR#: [id number]       PCP:  Colette Ribas, MD      Insurance: @PAYORNAME @   CC:   No chief complaint on file.  MEDICATION LIST TCS ON 03/17/12 NO RECENT PCP LABS, EXCEPT PSA  VS BP 150/80  Pulse 67  Ht 5\' 10"  (1.778 m)  Wt 185 lb 6.4 oz (84.097 kg)  BMI 26.60 kg/m2  SpO2 97%  Weights Current Weight  04/14/12 185 lb 6.4 oz (84.097 kg)  03/17/12 181 lb (82.101 kg)  03/17/12 181 lb (82.101 kg)    Blood Pressure  BP Readings from Last 3 Encounters:  04/14/12 150/80  03/17/12 116/74  03/17/12 116/74     Admit date:  (Not on file) Last encounter with RMR:  Visit date not found   Allergy Allergies  Allergen Reactions  . Codeine Nausea And Vomiting  . Demerol (Meperidine) Nausea And Vomiting  . Nalbuphine Nausea And Vomiting    Current Outpatient Prescriptions  Medication Sig Dispense Refill  . aspirin 81 MG EC tablet Take 81 mg by mouth daily.        . Coenzyme Q10 (CO Q 10) 100 MG CAPS Take 100 mg by mouth daily.      Marland Kitchen ibuprofen (ADVIL,MOTRIN) 200 MG tablet Take 200 mg by mouth every 6 (six) hours as needed. Pain      . lisinopril (PRINIVIL,ZESTRIL) 10 MG tablet Take 10 mg by mouth daily.        . Multiple Vitamin (ONE-A-DAY ESSENTIAL) TABS Take 1 tablet by mouth daily.      Marland Kitchen omeprazole (PRILOSEC) 20 MG capsule Take 20 mg by mouth 2 (two) times daily.      . sildenafil (VIAGRA) 50 MG tablet Take 50 mg by mouth daily as needed.      . Tamsulosin HCl (FLOMAX) 0.4 MG CAPS Take 0.4 mg by mouth daily.        Marland Kitchen VYTORIN 10-20 MG per tablet TAKE 1 TABLET DAILY  90 tablet  2    Discontinued Meds:    Medications Discontinued During This Encounter  Medication Reason  . ezetimibe-simvastatin (VYTORIN) 10-20 MG per tablet Error  . peg 3350 powder (MOVIPREP) 100 G SOLR Error    Patient Active Problem List  Diagnosis  . CHRONIC KIDNEY DISEASE-STAGE II  . Arteriosclerotic cardiovascular disease (ASCVD)  . GERD  (gastroesophageal reflux disease)  . Hypertension  . Hyperlipidemia  . BPH (benign prostatic hypertrophy)    LABS No visits with results within 3 Month(s) from this visit. Latest known visit with results is:  Office Visit on 04/04/2011  Component Date Value  . Cholesterol 04/04/2011 150   . Triglycerides 04/04/2011 116   . HDL 04/04/2011 45   . Total CHOL/HDL Ratio 04/04/2011 3.3   . VLDL 04/04/2011 23   . LDL Cholesterol 04/04/2011 82      Results for this Opt Visit:     Results for orders placed in visit on 04/04/11  LIPID PANEL      Component Value Range   Cholesterol 150  0 - 200 mg/dL   Triglycerides 782  <956 mg/dL   HDL 45  >21 mg/dL   Total CHOL/HDL Ratio 3.3     VLDL 23  0 - 40 mg/dL   LDL Cholesterol 82  0 - 99 mg/dL    EKG Orders placed in visit on 04/04/11  . EKG 12-LEAD  Prior Assessment and Plan Problem List as of 04/14/2012            Cardiology Problems   Arteriosclerotic cardiovascular disease (ASCVD)   Hypertension   Hyperlipidemia     Other   CHRONIC KIDNEY DISEASE-STAGE II   GERD (gastroesophageal reflux disease)   BPH (benign prostatic hypertrophy)       Imaging: No results found.   FRS Calculation: Score not calculated. Missing: Total Cholesterol

## 2012-04-15 LAB — COMPREHENSIVE METABOLIC PANEL
Albumin: 3.8 g/dL (ref 3.5–5.2)
BUN: 17 mg/dL (ref 6–23)
CO2: 29 mEq/L (ref 19–32)
Calcium: 9.6 mg/dL (ref 8.4–10.5)
Chloride: 105 mEq/L (ref 96–112)
Creat: 1.48 mg/dL — ABNORMAL HIGH (ref 0.50–1.35)
Glucose, Bld: 61 mg/dL — ABNORMAL LOW (ref 70–99)
Potassium: 4.4 mEq/L (ref 3.5–5.3)

## 2012-04-15 LAB — CBC
HCT: 41.6 % (ref 39.0–52.0)
Hemoglobin: 14.1 g/dL (ref 13.0–17.0)
MCHC: 33.9 g/dL (ref 30.0–36.0)
WBC: 6.5 10*3/uL (ref 4.0–10.5)

## 2012-04-18 ENCOUNTER — Encounter: Payer: Self-pay | Admitting: Cardiology

## 2012-04-21 ENCOUNTER — Encounter: Payer: Self-pay | Admitting: *Deleted

## 2012-06-27 DIAGNOSIS — IMO0001 Reserved for inherently not codable concepts without codable children: Secondary | ICD-10-CM | POA: Diagnosis not present

## 2012-06-27 DIAGNOSIS — IMO0002 Reserved for concepts with insufficient information to code with codable children: Secondary | ICD-10-CM | POA: Diagnosis not present

## 2012-06-27 DIAGNOSIS — Z6827 Body mass index (BMI) 27.0-27.9, adult: Secondary | ICD-10-CM | POA: Diagnosis not present

## 2012-07-16 ENCOUNTER — Emergency Department (HOSPITAL_COMMUNITY): Payer: Medicare Other

## 2012-07-16 ENCOUNTER — Encounter (HOSPITAL_COMMUNITY): Payer: Self-pay | Admitting: *Deleted

## 2012-07-16 ENCOUNTER — Observation Stay (HOSPITAL_COMMUNITY)
Admission: EM | Admit: 2012-07-16 | Discharge: 2012-07-18 | Disposition: A | Discharge status: Home / Self Care | Payer: Medicare Other | Attending: Family Medicine | Admitting: Family Medicine

## 2012-07-16 DIAGNOSIS — R059 Cough, unspecified: Secondary | ICD-10-CM | POA: Diagnosis not present

## 2012-07-16 DIAGNOSIS — R05 Cough: Secondary | ICD-10-CM | POA: Diagnosis not present

## 2012-07-16 DIAGNOSIS — R0789 Other chest pain: Secondary | ICD-10-CM | POA: Diagnosis not present

## 2012-07-16 DIAGNOSIS — I7090 Unspecified atherosclerosis: Secondary | ICD-10-CM

## 2012-07-16 DIAGNOSIS — I129 Hypertensive chronic kidney disease with stage 1 through stage 4 chronic kidney disease, or unspecified chronic kidney disease: Secondary | ICD-10-CM | POA: Diagnosis not present

## 2012-07-16 DIAGNOSIS — N183 Chronic kidney disease, stage 3 unspecified: Secondary | ICD-10-CM | POA: Diagnosis present

## 2012-07-16 DIAGNOSIS — K219 Gastro-esophageal reflux disease without esophagitis: Secondary | ICD-10-CM | POA: Diagnosis not present

## 2012-07-16 DIAGNOSIS — R42 Dizziness and giddiness: Secondary | ICD-10-CM | POA: Insufficient documentation

## 2012-07-16 DIAGNOSIS — R079 Chest pain, unspecified: Secondary | ICD-10-CM | POA: Diagnosis not present

## 2012-07-16 DIAGNOSIS — I2510 Atherosclerotic heart disease of native coronary artery without angina pectoris: Secondary | ICD-10-CM | POA: Diagnosis not present

## 2012-07-16 DIAGNOSIS — N400 Enlarged prostate without lower urinary tract symptoms: Secondary | ICD-10-CM | POA: Diagnosis present

## 2012-07-16 DIAGNOSIS — N189 Chronic kidney disease, unspecified: Secondary | ICD-10-CM | POA: Diagnosis present

## 2012-07-16 DIAGNOSIS — I10 Essential (primary) hypertension: Secondary | ICD-10-CM | POA: Diagnosis not present

## 2012-07-16 DIAGNOSIS — E871 Hypo-osmolality and hyponatremia: Secondary | ICD-10-CM | POA: Diagnosis not present

## 2012-07-16 DIAGNOSIS — N179 Acute kidney failure, unspecified: Secondary | ICD-10-CM | POA: Diagnosis not present

## 2012-07-16 DIAGNOSIS — I951 Orthostatic hypotension: Secondary | ICD-10-CM

## 2012-07-16 DIAGNOSIS — S37009A Unspecified injury of unspecified kidney, initial encounter: Secondary | ICD-10-CM | POA: Diagnosis not present

## 2012-07-16 DIAGNOSIS — N182 Chronic kidney disease, stage 2 (mild): Secondary | ICD-10-CM | POA: Insufficient documentation

## 2012-07-16 DIAGNOSIS — Z951 Presence of aortocoronary bypass graft: Secondary | ICD-10-CM | POA: Diagnosis not present

## 2012-07-16 LAB — URINALYSIS, ROUTINE W REFLEX MICROSCOPIC
Bilirubin Urine: NEGATIVE
Glucose, UA: NEGATIVE mg/dL
Hgb urine dipstick: NEGATIVE
Ketones, ur: NEGATIVE mg/dL
Leukocytes, UA: NEGATIVE
Nitrite: NEGATIVE
Protein, ur: NEGATIVE mg/dL
Specific Gravity, Urine: 1.02 (ref 1.005–1.030)
Urobilinogen, UA: 0.2 mg/dL (ref 0.0–1.0)
pH: 6 (ref 5.0–8.0)

## 2012-07-16 LAB — BASIC METABOLIC PANEL
BUN: 24 mg/dL — ABNORMAL HIGH (ref 6–23)
CO2: 25 mEq/L (ref 19–32)
Calcium: 9.6 mg/dL (ref 8.4–10.5)
Chloride: 95 mEq/L — ABNORMAL LOW (ref 96–112)
Creatinine, Ser: 2.02 mg/dL — ABNORMAL HIGH (ref 0.50–1.35)
GFR calc Af Amer: 38 mL/min — ABNORMAL LOW (ref >90)
GFR calc non Af Amer: 33 mL/min — ABNORMAL LOW (ref >90)
Glucose, Bld: 100 mg/dL — ABNORMAL HIGH (ref 70–99)
Potassium: 4.7 mEq/L (ref 3.5–5.1)
Sodium: 129 mEq/L — ABNORMAL LOW (ref 135–145)

## 2012-07-16 LAB — CBC
HCT: 41.8 % (ref 39.0–52.0)
Hemoglobin: 14.5 g/dL (ref 13.0–17.0)
MCH: 33.3 pg (ref 26.0–34.0)
MCHC: 34.7 g/dL (ref 30.0–36.0)
MCV: 96.1 fL (ref 78.0–100.0)
Platelets: 161 10*3/uL (ref 150–400)
RBC: 4.35 MIL/uL (ref 4.22–5.81)
RDW: 13.4 % (ref 11.5–15.5)
WBC: 10.8 10*3/uL — ABNORMAL HIGH (ref 4.0–10.5)

## 2012-07-16 LAB — TROPONIN I
Troponin I: <0.3 ng/mL (ref <0.30)
Troponin I: <0.3 ng/mL (ref <0.30)

## 2012-07-16 MED ORDER — ONDANSETRON HCL 4 MG/2ML IJ SOLN: 4.0000 mg | Freq: Four times a day (QID) | INTRAMUSCULAR | Status: DC | PRN

## 2012-07-16 MED ORDER — ACETAMINOPHEN 650 MG RE SUPP: 650.0000 mg | Freq: Four times a day (QID) | RECTAL | Status: DC | PRN

## 2012-07-16 MED ORDER — ONDANSETRON HCL 4 MG PO TABS: 4.0000 mg | ORAL_TABLET | Freq: Four times a day (QID) | ORAL | Status: DC | PRN

## 2012-07-16 MED ORDER — ASPIRIN 81 MG PO CHEW
324.0000 mg | CHEWABLE_TABLET | Freq: Once | ORAL | Status: AC
  Administered 2012-07-16: 324 mg via ORAL
  Filled 2012-07-16: qty 4

## 2012-07-16 MED ORDER — SODIUM CHLORIDE 0.9 % IV BOLUS (SEPSIS)
1000.0000 mL | Freq: Once | INTRAVENOUS | Status: AC
  Administered 2012-07-16: 1000 mL via INTRAVENOUS

## 2012-07-16 MED ORDER — SODIUM CHLORIDE 0.9 % IV SOLN
INTRAVENOUS | Status: DC
  Administered 2012-07-16: 22:00:00 via INTRAVENOUS

## 2012-07-16 MED ORDER — HEPARIN SODIUM (PORCINE) 5000 UNIT/ML IJ SOLN
5000.0000 [IU] | Freq: Three times a day (TID) | INTRAMUSCULAR | Status: DC
  Administered 2012-07-16 – 2012-07-18 (×5): 5000 [IU] via SUBCUTANEOUS
  Filled 2012-07-16 (×5): qty 1

## 2012-07-16 MED ORDER — ALBUTEROL SULFATE (5 MG/ML) 0.5% IN NEBU: 2.5000 mg | INHALATION_SOLUTION | RESPIRATORY_TRACT | Status: DC | PRN

## 2012-07-16 MED ORDER — TAMSULOSIN HCL 0.4 MG PO CAPS
0.4000 mg | ORAL_CAPSULE | Freq: Every day | ORAL | Status: DC
  Administered 2012-07-17 – 2012-07-18 (×2): 0.4 mg via ORAL
  Filled 2012-07-16 (×2): qty 1

## 2012-07-16 MED ORDER — ASPIRIN EC 81 MG PO TBEC
81.0000 mg | DELAYED_RELEASE_TABLET | Freq: Every day | ORAL | Status: DC
  Administered 2012-07-16 – 2012-07-17 (×2): 81 mg via ORAL
  Filled 2012-07-16 (×2): qty 1

## 2012-07-16 MED ORDER — SODIUM CHLORIDE 0.9 % IJ SOLN
3.0000 mL | Freq: Two times a day (BID) | INTRAMUSCULAR | Status: DC
  Administered 2012-07-17 – 2012-07-18 (×2): 3 mL via INTRAVENOUS

## 2012-07-16 MED ORDER — EZETIMIBE-SIMVASTATIN 10-20 MG PO TABS: 1.0000 | ORAL_TABLET | Freq: Every day | ORAL | Status: DC

## 2012-07-16 MED ORDER — ADULT MULTIVITAMIN W/MINERALS CH
1.0000 | ORAL_TABLET | Freq: Every morning | ORAL | Status: DC
  Administered 2012-07-17 – 2012-07-18 (×2): 1 via ORAL
  Filled 2012-07-16 (×2): qty 1

## 2012-07-16 MED ORDER — ACETAMINOPHEN 325 MG PO TABS: 650.0000 mg | ORAL_TABLET | Freq: Four times a day (QID) | ORAL | Status: DC | PRN

## 2012-07-16 MED ORDER — PANTOPRAZOLE SODIUM 40 MG PO TBEC
40.0000 mg | DELAYED_RELEASE_TABLET | Freq: Two times a day (BID) | ORAL | Status: DC
  Administered 2012-07-16 – 2012-07-18 (×4): 40 mg via ORAL
  Filled 2012-07-16 (×4): qty 1

## 2012-07-16 MED ORDER — EZETIMIBE 10 MG PO TABS
10.0000 mg | ORAL_TABLET | Freq: Every day | ORAL | Status: DC
  Administered 2012-07-16 – 2012-07-17 (×2): 10 mg via ORAL
  Filled 2012-07-16 (×2): qty 1

## 2012-07-16 MED ORDER — SIMVASTATIN 20 MG PO TABS
20.0000 mg | ORAL_TABLET | Freq: Every day | ORAL | Status: DC
  Administered 2012-07-16 – 2012-07-17 (×2): 20 mg via ORAL
  Filled 2012-07-16 (×2): qty 1

## 2012-07-16 NOTE — ED Provider Notes (Signed)
History    This chart was scribed for Raeford Razor, MD by Charolett Bumpers, ED Scribe. The patient was seen in room APA02/APA02. Patient's care was started at 15:14.   CSN: [id number]  Arrival date & time 07/16/12  1507   First MD Initiated Contact with Patient 07/16/12 1514      Chief Complaint  Patient presents with  . Dizziness  . Weakness  . Chest Pain   The history is provided by the patient. No language interpreter was used.  [name] is a 67 y.o. male who presents to the Emergency Department complaining of intermittent bilateral leg weakness with associated light-headedness and mild chest pressure that started yesterday. He states he noticed the weakness and light-headedness yesterday when he bent over and raised back up. He describes the light-headedness as a faint feeling. He describes the chest pain as pressure and denies any sharp pains. He states that he was playing golf yesterday and walking up a hill when he first noticed the chest pain. Similar pain again later this morning without exertion. He denies any SOB or leg swelling but states that he had a severe cough over the past month and may have pulled a muscle. He also has been recently treated for a UTI with antibiotics over the past 6 weeks that was prescribed by Dr. Jerre Simon. He states that his last check up with Dr. Dietrich Pates in December was normal, but states it has been awhile since his last stress test.   Cardiologist: Dr. Dietrich Pates  Past Medical History  Diagnosis Date  . Arteriosclerotic cardiovascular disease (ASCVD)     CABG in 08/1998  . GERD (gastroesophageal reflux disease)     s/p espohageal dilatatin and repair of hiatal hernia  . Hypertension   . Hyperlipidemia   . BPH (benign prostatic hypertrophy)     h/x of prostatis/ UTI in 2006  . Sigmoid diverticulosis   . PONV (postoperative nausea and vomiting)   . Chronic kidney disease, stage II (mild) 2010    Creatinine of 1.5 in 2010    Past  Surgical History  Procedure Laterality Date  . Cholecystectomy  1990s  . Hiatal hernia repair  1995  . Lumbar laminectomy  1999  . Hemorrhoid surgery  2007  . Coronary artery bypass graft  2000  . Colonoscopy  03/17/2012    [facility]; negative screening study    Family History  Problem Relation Age of Onset  . Colon cancer Neg Hx   . Coronary artery disease Father     also grandfather    History  Substance Use Topics  . Smoking status: Never Smoker   . Smokeless tobacco: Never Used     Comment: no use of tobacco products  . Alcohol Use: 1.0 oz/week    2 drink(s) per week     Comment: "Occasionally"      Review of Systems  Respiratory: Negative for shortness of breath.   Cardiovascular: Positive for chest pain. Negative for leg swelling.  Neurological: Positive for dizziness and weakness.  All other systems reviewed and are negative.    Allergies  Codeine; Demerol; and Nalbuphine  Home Medications   Current Outpatient Rx  Name  Route  Sig  Dispense  Refill  . aspirin 81 MG EC tablet   Oral   Take 81 mg by mouth daily.           . Coenzyme Q10 (CO Q 10) 100 MG CAPS   Oral   Take 100  mg by mouth daily.         Marland Kitchen ibuprofen (ADVIL,MOTRIN) 200 MG tablet   Oral   Take 200 mg by mouth every 6 (six) hours as needed. Pain         . lisinopril (PRINIVIL,ZESTRIL) 10 MG tablet   Oral   Take 10 mg by mouth daily.           . Multiple Vitamin (ONE-A-DAY ESSENTIAL) TABS   Oral   Take 1 tablet by mouth daily.         Marland Kitchen omeprazole (PRILOSEC) 20 MG capsule   Oral   Take 20 mg by mouth 2 (two) times daily.         . sildenafil (VIAGRA) 50 MG tablet   Oral   Take 50 mg by mouth daily as needed.         . Tamsulosin HCl (FLOMAX) 0.4 MG CAPS   Oral   Take 0.4 mg by mouth daily.           Marland Kitchen VYTORIN 10-20 MG per tablet      TAKE 1 TABLET DAILY   90 tablet   2     BP 138/78  Pulse 75  Temp(Src) 97.7 F (36.5 C) (Oral)  Resp 20  Ht 5\' 10"   (1.778 m)  Wt 185 lb (83.915 kg)  BMI 26.54 kg/m2  SpO2 98%  Physical Exam  Nursing note and vitals reviewed. Constitutional: He appears well-developed and well-nourished. No distress.  HENT:  Head: Normocephalic and atraumatic.  Eyes: Conjunctivae are normal. Right eye exhibits no discharge. Left eye exhibits no discharge.  Neck: Normal range of motion. Neck supple.  Cardiovascular: Normal rate, regular rhythm and normal heart sounds.  Exam reveals no gallop and no friction rub.   No murmur heard. Pulmonary/Chest: Effort normal and breath sounds normal. No respiratory distress.  Abdominal: Soft. He exhibits no distension. There is no tenderness.  Musculoskeletal: He exhibits no edema and no tenderness.  Neurological: He is alert.  Skin: Skin is warm and dry.  Psychiatric: He has a normal mood and affect. His behavior is normal. Thought content normal.    ED Course  Procedures (including critical care time)  DIAGNOSTIC STUDIES: Oxygen Saturation is 98% on room air, normal by my interpretation.    COORDINATION OF CARE:  3:32 PM-Discussed planned course of treatment with the patient including chest x-ray, blood work and UA, who is agreeable at this time.     Labs Reviewed  CBC - Abnormal; Notable for the following:    WBC 10.8 (*)    All other components within normal limits  BASIC METABOLIC PANEL - Abnormal; Notable for the following:    Sodium 129 (*)    Chloride 95 (*)    Glucose, Bld 100 (*)    BUN 24 (*)    Creatinine, Ser 2.02 (*)    GFR calc non Af Amer 33 (*)    GFR calc Af Amer 38 (*)    All other components within normal limits  TROPONIN I  URINALYSIS, ROUTINE W REFLEX MICROSCOPIC   Dg Chest 2 View  07/16/2012  *RADIOLOGY REPORT*  Clinical Data: Cough and congestion for 3-4 weeks, chest pain, history hypertension, coronary disease post CABG in 2000  CHEST - 2 VIEW  Comparison: 10/05/2004  Findings: Upper normal heart size post CABG. Small hiatal hernia.  Mediastinal contours and pulmonary vascular otherwise normal. Lungs clear. No pleural effusion or pneumothorax. No acute osseous findings.  IMPRESSION:  Small hiatal hernia. Post CABG. No acute abnormalities.   Original Report Authenticated By: Ulyses Southward, M.D.    EKG:  Rhythm: normal sinus Vent. rate 70 BPM PR interval 138 ms QRS duration 114 ms IRBB QT/QTc 388/419 ms Inferior q waves ST segments: NS ST changes   1. Chest pain   2. Orthostasis   3. AKI (acute kidney injury)       MDM  66yM with dizziness in CP. Dizziness sounds orthostatic in nature. Likely some component of dehydration with AKI, hyponatremia. Repeat episodes of CP although currently none. Will admit for r/o.    I personally preformed the services scribed in my presence. The recorded information has been reviewed is accurate. Raeford Razor, MD.        Raeford Razor, MD 07/16/12 2222

## 2012-07-16 NOTE — ED Notes (Signed)
Recent UTI, pt now c/o dizziness and bilateral leg weakness since yesterday, admits chest pressure on both sides, + cough NP per pt

## 2012-07-16 NOTE — ED Notes (Signed)
Attempted to call report but nurse was not ready at this time.

## 2012-07-16 NOTE — H&P (Addendum)
[facility] History and Physical  [name]:[id number] DOB: 1945-09-26 DOA: 07/16/2012   PCP: Colette Ribas, MD  Specialists: His follow Dr. Dietrich Pates, who is his cardiologist. He's also followed by Dr. Jerre Simon a urologist for frequent UTIs  Chief Complaint: Dizziness, and chest pressure  HPI: [name] is a 67 y.o. male with a past medical history of coronary artery disease, status post CABG, hypertension, hyperlipidemia, frequent UTIs, who was in his usual state of health till about 11 AM this morning when he had finished working on some house project. He had exerted himself all morning and then he experienced some lightheadedness and dizziness. He also felt like his legs were weak. He had to sit down. This was followed by onset of some chest pressure which lasted a few minutes. It didn't radiate anywhere. It was 2/10 in intensity. It was located in the retrosternal area and towards the left side of the chest. Took 2 doses of lisinopril. He was given four aspirins in the ED. Currently, his pain is resolved. He also mentioned that he went to play golf yesterday and when he was exerting himself he could feel some of the pressure as well and at that time he got a little short of breath. He's been having a cough for the last 4 weeks. Denies any hemoptysis. Denies any expectoration. The cough is getting better. Denies any palpitations recently. Had some nausea, but denies any emesis. Has history of acid reflux. Denies any leg swelling. Denies similar symptoms in the past. Denies any chills. He may have had a fever a few days ago, but is not very sure. He also tells me that 3-4 days ago, he experienced some burning sensation in the urine and started taking Bactrim on his own and that is doing better at this time. He also told me that about 4 weeks ago he went to see his PCP because he was having joint pains and body aches, which was attributed to Levaquin, which he was taking for UTI.  He was given a course of prednisone and his symptoms are improved now.  Home Medications: Prior to Admission medications   Medication Sig Start Date End Date Taking? Authorizing Provider  aspirin 81 MG EC tablet Take 81 mg by mouth at bedtime.    Yes Historical Provider, MD  Coenzyme Q10 (CO Q 10) 100 MG CAPS Take 100 mg by mouth every morning.    Yes Historical Provider, MD  ezetimibe-simvastatin (VYTORIN) 10-20 MG per tablet Take 1 tablet by mouth at bedtime.   Yes Historical Provider, MD  ibuprofen (ADVIL,MOTRIN) 200 MG tablet Take 200 mg by mouth daily as needed. Pain   Yes Historical Provider, MD  lisinopril (PRINIVIL,ZESTRIL) 10 MG tablet Take 10 mg by mouth daily.     Yes Historical Provider, MD  Multiple Vitamin (MULTIVITAMIN WITH MINERALS) TABS Take 1 tablet by mouth every morning.   Yes Historical Provider, MD  omeprazole (PRILOSEC) 20 MG capsule Take 20 mg by mouth 2 (two) times daily.   Yes Historical Provider, MD  sildenafil (VIAGRA) 50 MG tablet Take 50 mg by mouth daily as needed.   Yes Historical Provider, MD  sulfamethoxazole-trimethoprim (BACTRIM DS) 800-160 MG per tablet Take 1 tablet by mouth 2 (two) times daily.   Yes Historical Provider, MD  Tamsulosin HCl (FLOMAX) 0.4 MG CAPS Take 0.4 mg by mouth daily.    Yes Historical Provider, MD    Allergies:  Allergies  Allergen Reactions  . Codeine  Nausea And Vomiting  . Demerol (Meperidine) Nausea And Vomiting  . Nalbuphine Nausea And Vomiting    Past Medical History: Past Medical History  Diagnosis Date  . Arteriosclerotic cardiovascular disease (ASCVD)     CABG in 08/1998  . GERD (gastroesophageal reflux disease)     s/p espohageal dilatatin and repair of hiatal hernia  . Hypertension   . Hyperlipidemia   . BPH (benign prostatic hypertrophy)     h/x of prostatis/ UTI in 2006  . Sigmoid diverticulosis   . PONV (postoperative nausea and vomiting)   . Chronic kidney disease, stage II (mild) 2010    Creatinine of 1.5  in 2010  . Coronary artery disease     Past Surgical History  Procedure Laterality Date  . Cholecystectomy  1990s  . Hiatal hernia repair  1995  . Lumbar laminectomy  1999  . Hemorrhoid surgery  2007  . Coronary artery bypass graft  2000  . Colonoscopy  03/17/2012    [facility]; negative screening study    Social History:  reports that he has never smoked. He has never used smokeless tobacco. He reports that he drinks about 1.0 ounces of alcohol per week. He reports that he does not use illicit drugs.  Living Situation: Lives with his wife Activity Level: Independent with daily activities   Family History:  Family History  Problem Relation Age of Onset  . Colon cancer Neg Hx   . Coronary artery disease Father     also grandfather     Review of Systems - History obtained from the patient General ROS: positive for  - fatigue Psychological ROS: negative Ophthalmic ROS: negative ENT ROS: negative Allergy and Immunology ROS: negative Hematological and Lymphatic ROS: negative Endocrine ROS: negative Respiratory ROS: as in hpi Cardiovascular ROS: as in hpi Gastrointestinal ROS: no abdominal pain, change in bowel habits, or black or bloody stools Genito-Urinary ROS: no dysuria, trouble voiding, or hematuria Musculoskeletal ROS: negative Neurological ROS: no TIA or stroke symptoms Dermatological ROS: negative  Physical Examination  Filed Vitals:   07/16/12 1509 07/16/12 1600 07/16/12 1800 07/16/12 1830  BP: 138/78 128/66 130/68 130/68  Pulse: 75 70 62 65  Temp: 97.7 F (36.5 C)     TempSrc: Oral     Resp: 20 11 15 16   Height: 5\' 10"  (1.778 m)     Weight: 83.915 kg (185 lb)     SpO2: 98% 95% 96% 97%    General appearance: alert, cooperative, appears stated age and no distress Head: Normocephalic, without obvious abnormality, atraumatic Eyes: conjunctivae/corneas clear. PERRL, EOM's intact. Throat: lips, mucosa, and tongue normal; teeth and gums normal Neck: no  adenopathy, no carotid bruit, no JVD, supple, symmetrical, trachea midline and thyroid not enlarged, symmetric, no tenderness/mass/nodules Back: symmetric, no curvature. ROM normal. No CVA tenderness. Resp: clear to auscultation bilaterally Cardio: regular rate and rhythm, S1, S2 normal, no murmur, click, rub or gallop GI: soft, non-tender; bowel sounds normal; no masses,  no organomegaly Extremities: extremities normal, atraumatic, no cyanosis or edema Pulses: 2+ and symmetric Skin: Skin color, texture, turgor normal. No rashes or lesions Lymph nodes: Cervical, supraclavicular, and axillary nodes normal. Neurologic: Is alert and oriented x3. No focal neurological deficits are present.  Laboratory Data: Results for orders placed during the hospital encounter of 07/16/12 (from the past 48 hour(s))  TROPONIN I     Status: None   Collection Time    07/16/12  3:55 PM      Result Value Range  Troponin I <0.30  <0.30 ng/mL   Comment:            Due to the release kinetics of cTnI,     a negative result within the first hours     of the onset of symptoms does not rule out     myocardial infarction with certainty.     If myocardial infarction is still suspected,     repeat the test at appropriate intervals.  CBC     Status: Abnormal   Collection Time    07/16/12  3:55 PM      Result Value Range   WBC 10.8 (*) 4.0 - 10.5 K/uL   RBC 4.35  4.22 - 5.81 MIL/uL   Hemoglobin 14.5  13.0 - 17.0 g/dL   HCT 16.1  09.6 - 04.5 %   MCV 96.1  78.0 - 100.0 fL   MCH 33.3  26.0 - 34.0 pg   MCHC 34.7  30.0 - 36.0 g/dL   RDW 40.9  81.1 - 91.4 %   Platelets 161  150 - 400 K/uL  BASIC METABOLIC PANEL     Status: Abnormal   Collection Time    07/16/12  3:55 PM      Result Value Range   Sodium 129 (*) 135 - 145 mEq/L   Potassium 4.7  3.5 - 5.1 mEq/L   Chloride 95 (*) 96 - 112 mEq/L   CO2 25  19 - 32 mEq/L   Glucose, Bld 100 (*) 70 - 99 mg/dL   BUN 24 (*) 6 - 23 mg/dL   Creatinine, Ser 7.82 (*) 0.50  - 1.35 mg/dL   Calcium 9.6  8.4 - 95.6 mg/dL   GFR calc non Af Amer 33 (*) >90 mL/min   GFR calc Af Amer 38 (*) >90 mL/min   Comment:            The eGFR has been calculated     using the CKD EPI equation.     This calculation has not been     validated in all clinical     situations.     eGFR's persistently     <90 mL/min signify     possible Chronic Kidney Disease.  URINALYSIS, ROUTINE W REFLEX MICROSCOPIC     Status: None   Collection Time    07/16/12  5:16 PM      Result Value Range   Color, Urine YELLOW  YELLOW   APPearance CLEAR  CLEAR   Specific Gravity, Urine 1.020  1.005 - 1.030   pH 6.0  5.0 - 8.0   Glucose, UA NEGATIVE  NEGATIVE mg/dL   Hgb urine dipstick NEGATIVE  NEGATIVE   Bilirubin Urine NEGATIVE  NEGATIVE   Ketones, ur NEGATIVE  NEGATIVE mg/dL   Protein, ur NEGATIVE  NEGATIVE mg/dL   Urobilinogen, UA 0.2  0.0 - 1.0 mg/dL   Nitrite NEGATIVE  NEGATIVE   Leukocytes, UA NEGATIVE  NEGATIVE   Comment: MICROSCOPIC NOT DONE ON URINES WITH NEGATIVE PROTEIN, BLOOD, LEUKOCYTES, NITRITE, OR GLUCOSE <1000 mg/dL.    Radiology Reports: Dg Chest 2 View  07/16/2012  *RADIOLOGY REPORT*  Clinical Data: Cough and congestion for 3-4 weeks, chest pain, history hypertension, coronary disease post CABG in 2000  CHEST - 2 VIEW  Comparison: 10/05/2004  Findings: Upper normal heart size post CABG. Small hiatal hernia. Mediastinal contours and pulmonary vascular otherwise normal. Lungs clear. No pleural effusion or pneumothorax. No acute osseous findings.  IMPRESSION: Small hiatal hernia.  Post CABG. No acute abnormalities.   Original Report Authenticated By: Ulyses Southward, M.D.     Electrocardiogram: Sinus rhythm at 70 beats per minute. Normal axis. Intervals appear to be normal. There is evidence for possible Q wave in lead 3 which is old. There is T inversion in leads V1 and V2. Inversion in V2 is new compared to previous EKG. No concerning ST changes are noted.  Problem List  Principal  Problem:   Chest pain on exertion Active Problems:   CHRONIC KIDNEY DISEASE-STAGE II   Arteriosclerotic cardiovascular disease (ASCVD)   GERD (gastroesophageal reflux disease)   Hypertension   BPH (benign prostatic hypertrophy)   Acute on chronic renal failure   Hyponatremia   Assessment: This is a 67 year old, Caucasian male, with a past medical history as stated earlier, presents with the dizziness and then chest pressure. He has nonspecific EKG changes. He has a known history of CAD. He also has acute on chronic renal failure, which would could be due to the Bactrim or the recent exertion and dehydration. He could be orthostatic.  Plan: #1 chest pressure: This is in the known setting of CAD. His initial troponin is negative. His EKG shows nonspecific changes. He'll be admitted to the hospital. His troponin will be cycled. EKG will be repeated in the morning. Continue with aspirin. Echocardiogram will be obtained due to his dizziness. I think it would be prudent to get a cardiology evaluation at this time. Patient is 14 years out from his CABG. He had a stress test 2005 which was abnormal. No cardiac catheterization was performed. He tells me that he follows up with his cardiologist and his last appointment was in December.  #2 dizziness with mild dehydration: Could be orthostatic from high dehydration. His renal function is definitely more impaired than his usual. We will give him IV fluids. We'll check orthostatics. Echocardiogram will give more information.  #3 acute on chronic renal failure with hyponatremia: Should improve with IV hydration. We'll repeat renal function panel tomorrow.  #4 history of CAD, status post CABG: As above.  #5 history of frequent UTIs: Due to his acute renal failure we'll hold the Bactrim for now. UA today did not suggest any infection. Continue to monitor  DVT Prophylaxis: Heparin Code Status: He is a full code Family Communication: Discussed with the  patient and his wife  Disposition Plan: Likely return home when improved   Further management decisions will depend on results of further testing and patient's response to treatment.  [facility]  [facility] Pager [phone]  If 7PM-7AM, please contact night-coverage [email or web address] Password [facility]  07/16/2012, 7:41 PM

## 2012-07-17 DIAGNOSIS — N182 Chronic kidney disease, stage 2 (mild): Secondary | ICD-10-CM | POA: Diagnosis not present

## 2012-07-17 DIAGNOSIS — N179 Acute kidney failure, unspecified: Secondary | ICD-10-CM | POA: Diagnosis not present

## 2012-07-17 DIAGNOSIS — I129 Hypertensive chronic kidney disease with stage 1 through stage 4 chronic kidney disease, or unspecified chronic kidney disease: Secondary | ICD-10-CM | POA: Diagnosis not present

## 2012-07-17 DIAGNOSIS — R079 Chest pain, unspecified: Secondary | ICD-10-CM | POA: Diagnosis not present

## 2012-07-17 DIAGNOSIS — R0789 Other chest pain: Secondary | ICD-10-CM | POA: Diagnosis not present

## 2012-07-17 DIAGNOSIS — I2510 Atherosclerotic heart disease of native coronary artery without angina pectoris: Secondary | ICD-10-CM | POA: Diagnosis not present

## 2012-07-17 DIAGNOSIS — I7090 Unspecified atherosclerosis: Secondary | ICD-10-CM | POA: Diagnosis not present

## 2012-07-17 LAB — TROPONIN I
Troponin I: <0.3 ng/mL (ref <0.30)
Troponin I: <0.3 ng/mL (ref <0.30)

## 2012-07-17 LAB — COMPREHENSIVE METABOLIC PANEL
AST: 26 U/L (ref 0–37)
Albumin: 3 g/dL — ABNORMAL LOW (ref 3.5–5.2)
Calcium: 9 mg/dL (ref 8.4–10.5)
Creatinine, Ser: 1.87 mg/dL — ABNORMAL HIGH (ref 0.50–1.35)

## 2012-07-17 LAB — CBC
Hemoglobin: 13.6 g/dL (ref 13.0–17.0)
MCH: 33.3 pg (ref 26.0–34.0)
MCV: 96.8 fL (ref 78.0–100.0)
RBC: 4.09 MIL/uL — ABNORMAL LOW (ref 4.22–5.81)
WBC: 7.6 10*3/uL (ref 4.0–10.5)

## 2012-07-17 LAB — TSH: TSH: 1.518 u[IU]/mL (ref 0.350–4.500)

## 2012-07-17 MED ORDER — SODIUM CHLORIDE 0.9 % IV SOLN
INTRAVENOUS | Status: AC
  Administered 2012-07-17: 16:00:00 via INTRAVENOUS

## 2012-07-17 NOTE — Progress Notes (Signed)
UR Chart Review Completed  

## 2012-07-17 NOTE — Consult Note (Addendum)
CARDIOLOGY CONSULT NOTE  Patient ID: [name] MRN: [id number] DOB/AGE: 1945/10/22 67 y.o.  Admit date: 07/16/2012 Referring Physician: PTH Primary PhysicianGOLDING, Chancy Hurter, MD Primary Cardiologist: Dietrich Pates Reason for Consultation: Chest pain with known CAD Principal Problem:   Chest pain on exertion Active Problems:   CHRONIC KIDNEY DISEASE-STAGE II   Arteriosclerotic cardiovascular disease (ASCVD)   GERD (gastroesophageal reflux disease)   Hypertension   BPH (benign prostatic hypertrophy)   Acute on chronic renal failure   Hyponatremia  HPI: [name] is a talkative 67 year old patient admitted with generalized weakness, fatigue, chest pressure, which have been going on over the last 2 days. He tells lengthy story which begins in October for UTI with multiple antibiotic treatments. He states that this positive feel weak at that time but it quickly resolved. The patient has been seen by Dr.Javid and his primary care physician for recurrent UTIs since October, and also for bronchitis. He states that he has not felt right since finishing up with antibiotics. He was playing golf 2 days ago, when he had exertional shortness of breath and mild pressure in his chest when climbing stairs and hills around the course. This resolved once he rested. Yesterday after doing our work he again began have some weakness and dizziness and pressure in his chest with associated shortness of breath. This prompted him to come to ER for further evaluation.     In ER the patient's blood pressure is 130/70 with heart rate of 75. The patient was found to have hyponatremia with a sodium level of 129, and elevated creatinine of 2.02. Cardiac enzymes found be negative initially at 0.30. He was not found to be anemic. Chest x-ray revealed small hiatal hernia, but no acute abnormalities. EKG was negative for acute coronary syndrome without evidence of ST T wave changes acutely.        He has a history of CAD  with coronary artery bypass grafting in 2000, hypertension, hypercholesterolemia. Most recent stress test was completed in 2007 which was found to be negative for ischemia. He also has a history of GERD and esophageal dilatation through Dr. Kendell Bane office.  Review of systems complete and found to be negative unless listed above   Past Medical History  Diagnosis Date  . Arteriosclerotic cardiovascular disease (ASCVD)     CABG in 08/1998  . GERD (gastroesophageal reflux disease)     s/p espohageal dilatatin and repair of hiatal hernia  . Hypertension   . Hyperlipidemia   . BPH (benign prostatic hypertrophy)     h/x of prostatis/ UTI in 2006  . Sigmoid diverticulosis   . PONV (postoperative nausea and vomiting)   . Chronic kidney disease, stage II (mild) 2010    Creatinine of 1.5 in 2010  . Coronary artery disease     Family History  Problem Relation Age of Onset  . Colon cancer Neg Hx   . Coronary artery disease Father     also grandfather    History   Social History  . Marital Status: Married    Spouse Name: N/A    Number of Children: N/A  . Years of Education: N/A   Occupational History  . Public affairs consultant [employer]   Social History Main Topics  . Smoking status: Never Smoker   . Smokeless tobacco: Never Used     Comment: no use of tobacco products  . Alcohol Use: 1.0 oz/week    2 drink(s) per week     Comment: "Occasionally"  .  Drug Use: No  . Sexually Active: Not on file   Other Topics Concern  . Not on file   Social History Narrative   Previously  Employed as an Public affairs consultant.     Past Surgical History  Procedure Laterality Date  . Cholecystectomy  1990s  . Hiatal hernia repair  1995  . Lumbar laminectomy  1999  . Hemorrhoid surgery  2007  . Coronary artery bypass graft  2000  . Colonoscopy  03/17/2012    [place]; negative screening study    Prescriptions prior to admission  Medication Sig Dispense Refill  . aspirin 81 MG EC tablet Take  81 mg by mouth at bedtime.       . Coenzyme Q10 (CO Q 10) 100 MG CAPS Take 100 mg by mouth every morning.       . ezetimibe-simvastatin (VYTORIN) 10-20 MG per tablet Take 1 tablet by mouth at bedtime.      Marland Kitchen ibuprofen (ADVIL,MOTRIN) 200 MG tablet Take 200 mg by mouth daily as needed. Pain      . lisinopril (PRINIVIL,ZESTRIL) 10 MG tablet Take 10 mg by mouth daily.        . Multiple Vitamin (MULTIVITAMIN WITH MINERALS) TABS Take 1 tablet by mouth every morning.      Marland Kitchen omeprazole (PRILOSEC) 20 MG capsule Take 20 mg by mouth 2 (two) times daily.      . sildenafil (VIAGRA) 50 MG tablet Take 50 mg by mouth daily as needed.      . sulfamethoxazole-trimethoprim (BACTRIM DS) 800-160 MG per tablet Take 1 tablet by mouth 2 (two) times daily.      . Tamsulosin HCl (FLOMAX) 0.4 MG CAPS Take 0.4 mg by mouth daily.        Physical Exam: Blood pressure 124/66, pulse 56, temperature 97.4 F (36.3 C), temperature source Oral, resp. rate 20, height 5\' 10"  (1.778 m), weight 172 lb 3.2 oz (78.109 kg), SpO2 99.00%.   General: Well developed, well nourished, in no acute distress Head: Eyes PERRLA, No xanthomas.   Normal cephalic and atramatic  Lungs: Clear bilaterally to auscultation and percussion. Heart: HRRR S1 S2, without MRG.  Pulses are 2+ & equal.            No carotid bruit. No JVD.  No abdominal bruits. No femoral bruits. Abdomen: Bowel sounds are positive, abdomen soft and non-tender without masses or                  Hernia's noted. Msk:  Back normal, normal gait. Normal strength and tone for age. Extremities: No clubbing, cyanosis or edema.  DP +1 Neuro: Alert and oriented X 3. Psych:  Good affect, responds appropriately  Labs:   Lab Results  Component Value Date   WBC 7.6 07/17/2012   HGB 13.6 07/17/2012   HCT 39.6 07/17/2012   MCV 96.8 07/17/2012   PLT 146* 07/17/2012    Recent Labs Lab 07/17/12 0247  NA 134*  K 4.9  CL 101  CO2 26  BUN 20  CREATININE 1.87*  CALCIUM 9.0  PROT 6.0   BILITOT 0.8  ALKPHOS 54  ALT 23  AST 26  GLUCOSE 118*   Lab Results  Component Value Date   CHOL 150 04/04/2011   HDL 45 04/04/2011   LDLCALC 82 04/04/2011   TRIG 116 04/04/2011   CHOLHDL 3.3 04/04/2011    Radiology: Dg Chest 2 View  07/16/2012  *RADIOLOGY REPORT*  Clinical Data: Cough and congestion for 3-4  weeks, chest pain, history hypertension, coronary disease post CABG in 2000  CHEST - 2 VIEW  Comparison: 10/05/2004  Findings: Upper normal heart size post CABG. Small hiatal hernia. Mediastinal contours and pulmonary vascular otherwise normal. Lungs clear. No pleural effusion or pneumothorax. No acute osseous findings.  IMPRESSION: Small hiatal hernia. Post CABG. No acute abnormalities.   Original Report Authenticated By: Ulyses Southward, M.D.    RUE:AVWUJW sinus rhythm Incomplete right bundle branch block Inferior infarct , age undetermined  ASSESSMENT AND PLAN:   1. Chest Pain: Patient has known history of coronary artery disease status post four-vessel coronary artery bypass grafting in 2000. He had been doing very well up until the last 2 days with associated pressure in his chest dizziness lightheadedness and weakness. EKG was negative for acute coronary syndrome, initial troponin was negative x1. He has had a stress test in 2007 that was negative for ischemia. It was noted that he was hyponatremic and showed evidence of dehydration with elevated creatinine. He has been hydrated and has begun to feel better without any recurrence of discomfort or weakness. Continue aspirin.  2. Acute on chronic renal failure: Creatinine was an elevated creatinine of greater than 2, has been on Bactrim for several weeks for UTI. This is been discontinued. He has been given IV fluids and he is feeling some better. BMET will be followed.   3. Hypertension: Patient's blood pressure is well-controlled. It is noted on H&P that he had taken extra doses of lisinopril at home thinking that his blood pressure  was elevated causing him to feel dizzy and weak. He is not on any antihypertensives at this time. Continue with IV hydration.  4. Hyperlipidemia: It is possible that muscle aches and pains and generalized fatigue could be related to statin, however dehydration is likely cause. We will continue his simvastatin as directed.  Bettey Mare. Lyman Bishop NP [facility] 07/17/2012, 10:52 AM  Cardiology Attending Patient interviewed and examined. Discussed with Joni Reining, NP.  Above note annotated and modified based upon my findings.  Single episode of chest pressure in the setting of clearly non-cardiac illness.  I would not expect him to do well on the treadmill until he recovers from current problems and will cancel stress test.  He has a normal EKG and does not need imaging when stress test is ultimately performed.  Will be available as needed and plan office re-evaluation in 2 weeks.  [place] Bing, MD 07/17/2012, 6:34 PM

## 2012-07-17 NOTE — Progress Notes (Signed)
[facility] PROGRESS NOTE  [name] BMW:[id number] DOB: 1945/07/09 DOA: 07/16/2012 PCP: Colette Ribas, MD  Assessment/Plan: #1 chest pressure: This is in the known setting of CAD. His initial troponin is negative. His EKG shows nonspecific changes. EKG this am without changes noted yesterday. Echo done and results pending. Patient is 14 years out from his CABG. Appreciate cardiology assistance. Pt for stress test this hospitalization. No CP since admission.   #2 dizziness with mild dehydration: BP drop from sitting to standing but HR stable. Denies dizziness since admission and administration of IV fluids. Taking po fluids without problem currently.    #3 acute on chronic renal failure with hyponatremia: Improving s/p IV fluids. Taking po fluids currently. Creatinine trending downward and sodium trending upward. Will continue to monitor. Continue to hold lisinopril.   #4 history of CAD, status post CABG: See #1.    #5 history of frequent UTIs: Due to his acute renal failure we'll hold the Bactrim for now. UA today did not suggest any infection. Denies dysuria currently      Code Status: full Family Communication:  Disposition Plan: home when ready   Consultants:  Cardiology  Procedures:    Antibiotics:  none  HPI/Subjective: Watching TV. Denies pain/discomfort  Objective: Filed Vitals:   07/16/12 2220 07/16/12 2222 07/16/12 2224 07/17/12 0605  BP: 113/54 135/65 115/63 124/66  Pulse: 75 72 76 56  Temp:    97.4 F (36.3 C)  TempSrc:    Oral  Resp: 20 20 20 20   Height:      Weight:      SpO2: 97% 99% 99% 99%    Intake/Output Summary (Last 24 hours) at 07/17/12 1144 Last data filed at 07/17/12 0809  Gross per 24 hour  Intake 998.33 ml  Output   1900 ml  Net -901.67 ml   Filed Weights   07/16/12 1509 07/16/12 2039  Weight: 83.915 kg (185 lb) 78.109 kg (172 lb 3.2 oz)    Exam:   General:  Awake NAD  Cardiovascular: RRR No MGR No LE  edema. PPP bilaterally  Respiratory: normal effort BS clear bilaterally. No wheeze no rhonchi  Abdomen: round soft +BS non-tender to palpation. No guarding   Musculoskeletal: moves all extremities. No clubbing no cyanosis  Data Reviewed: Basic Metabolic Panel:  Recent Labs Lab 07/16/12 1555 07/17/12 0247  NA 129* 134*  K 4.7 4.9  CL 95* 101  CO2 25 26  GLUCOSE 100* 118*  BUN 24* 20  CREATININE 2.02* 1.87*  CALCIUM 9.6 9.0   Liver Function Tests:  Recent Labs Lab 07/17/12 0247  AST 26  ALT 23  ALKPHOS 54  BILITOT 0.8  PROT 6.0  ALBUMIN 3.0*   No results found for this basename: LIPASE, AMYLASE,  in the last 168 hours No results found for this basename: AMMONIA,  in the last 168 hours CBC:  Recent Labs Lab 07/16/12 1555 07/17/12 0247  WBC 10.8* 7.6  HGB 14.5 13.6  HCT 41.8 39.6  MCV 96.1 96.8  PLT 161 146*   Cardiac Enzymes:  Recent Labs Lab 07/16/12 1555 07/16/12 2113 07/17/12 0246  TROPONINI <0.30 <0.30 <0.30   BNP (last 3 results) No results found for this basename: PROBNP,  in the last 8760 hours CBG: No results found for this basename: GLUCAP,  in the last 168 hours  No results found for this or any previous visit (from the past 240 hour(s)).   Studies: Dg Chest 2 View  07/16/2012  *  RADIOLOGY REPORT*  Clinical Data: Cough and congestion for 3-4 weeks, chest pain, history hypertension, coronary disease post CABG in 2000  CHEST - 2 VIEW  Comparison: 10/05/2004  Findings: Upper normal heart size post CABG. Small hiatal hernia. Mediastinal contours and pulmonary vascular otherwise normal. Lungs clear. No pleural effusion or pneumothorax. No acute osseous findings.  IMPRESSION: Small hiatal hernia. Post CABG. No acute abnormalities.   Original Report Authenticated By: Ulyses Southward, M.D.     Scheduled Meds: . aspirin EC  81 mg Oral QHS  . ezetimibe  10 mg Oral QHS   And  . simvastatin  20 mg Oral QHS  . heparin  5,000 Units Subcutaneous Q8H  .  multivitamin with minerals  1 tablet Oral q morning - 10a  . pantoprazole  40 mg Oral BID  . sodium chloride  3 mL Intravenous Q12H  . tamsulosin  0.4 mg Oral QPC breakfast   Continuous Infusions:   Principal Problem:   Chest pain on exertion Active Problems:   CHRONIC KIDNEY DISEASE-STAGE II   Arteriosclerotic cardiovascular disease (ASCVD)   GERD (gastroesophageal reflux disease)   Hypertension   BPH (benign prostatic hypertrophy)   Acute on chronic renal failure   Hyponatremia    Time spent: 30 minutes    [facility] M  [facility]  If 7PM-7AM, please contact night-coverage at [email or web address], password [facility] 07/17/2012, 11:44 AM  LOS: 1 day

## 2012-07-17 NOTE — Progress Notes (Signed)
*  PRELIMINARY RESULTS* Echocardiogram 2D Echocardiogram has been performed.  [name] 07/17/2012, 10:48 AM

## 2012-07-17 NOTE — Progress Notes (Signed)
Cardiologist: Dr. Dietrich Pates  Patient seen, independently examined and chart reviewed. I agree with exam, assessment and plan discussed with Michael Smothers, NP.  Interval history: Cardiology has evaluated the patient and stress test is planned.  Subjective: Overall feels better. No chest pain.  Objective: Afebrile, vital signs stable. Regular rate and rhythm. Respiratory clear to auscultation bilaterally.  Labs: Cardiac enzymes negative. Creatinine improved, down to 1.87. Hyponatremia improved.  Acute issues:  Chest pain with history of coronary artery disease: Cardiac enzymes negative.  Dehydration: Resolving with IV fluids.  Acute renal failure: Resolving with IV fluids.  Plan:  Stress testing per cardiology  Followup echocardiogram  IV fluids  Repeat basic metabolic panel in the morning  Summary: 67 year old man with history of coronary artery disease, CABG presented with lightheadedness and dizziness associated with exertion as well as some chest pressure. Initial impression was chest pain, dizziness, mild dehydration, acute on chronic renal failure.  Michael Sacks, MD [facility] [phone]

## 2012-07-18 ENCOUNTER — Other Ambulatory Visit: Payer: Self-pay | Admitting: Adult Health

## 2012-07-18 DIAGNOSIS — I951 Orthostatic hypotension: Secondary | ICD-10-CM

## 2012-07-18 DIAGNOSIS — N189 Chronic kidney disease, unspecified: Secondary | ICD-10-CM | POA: Diagnosis not present

## 2012-07-18 DIAGNOSIS — R079 Chest pain, unspecified: Secondary | ICD-10-CM | POA: Diagnosis not present

## 2012-07-18 DIAGNOSIS — I2510 Atherosclerotic heart disease of native coronary artery without angina pectoris: Secondary | ICD-10-CM | POA: Diagnosis not present

## 2012-07-18 DIAGNOSIS — N179 Acute kidney failure, unspecified: Secondary | ICD-10-CM | POA: Diagnosis not present

## 2012-07-18 LAB — BASIC METABOLIC PANEL
CO2: 27 mEq/L (ref 19–32)
Calcium: 9.6 mg/dL (ref 8.4–10.5)
Chloride: 101 mEq/L (ref 96–112)
Creatinine, Ser: 1.79 mg/dL — ABNORMAL HIGH (ref 0.50–1.35)
Glucose, Bld: 103 mg/dL — ABNORMAL HIGH (ref 70–99)
Sodium: 136 mEq/L (ref 135–145)

## 2012-07-18 NOTE — Progress Notes (Signed)
Saline lock removed. Telemetry discontinued. Discharge instructions given. Care notes given. Pt verbalized understanding of instructions. Awaiting for family to arrive for discharge.

## 2012-07-18 NOTE — Discharge Summary (Signed)
Agree with discharge. See my progress note.  Kaly Mcquary, MD [facility] [phone]  

## 2012-07-18 NOTE — Discharge Summary (Signed)
Physician Discharge Summary  JARETT DRALLE ZOX:[id number] DOB: 02-Nov-1945 DOA: 07/16/2012  PCP: Colette Ribas, MD  Admit date: 07/16/2012 Discharge date: 07/18/2012  Time spent: 40 minutes  Recommendations for Outpatient Follow-up:  1. Follow up with Bailey Mech NP cardiology 07/30/12. Will need BMET before. 2. Follow up with PCP 1 week. Will need BMET at that time to check renal fucntion  Discharge Diagnoses:  Principal Problem:   Chest pain on exertion Active Problems:   CHRONIC KIDNEY DISEASE-STAGE II   Arteriosclerotic cardiovascular disease (ASCVD)   GERD (gastroesophageal reflux disease)   Hypertension   BPH (benign prostatic hypertrophy)   Acute on chronic renal failure   Hyponatremia   Discharge Condition: stab;e  Diet recommendation: heart healthy  Filed Weights   07/16/12 1509 07/16/12 2039  Weight: 83.915 kg (185 lb) 78.109 kg (172 lb 3.2 oz)    History of present illness:  [name] is a 67 y.o. male with a past medical history of coronary artery disease, status post CABG, hypertension, hyperlipidemia, frequent UTIs, who was in his usual state of health till about 11 AM 07/16/12 when he had finished working on some house project. He had exerted himself all morning and then he experienced some lightheadedness and dizziness. He also felt like his legs were weak. He had to sit down. This was followed by onset of some chest pressure which lasted a few minutes. It didn't radiate anywhere. It was 2/10 in intensity. It was located in the retrosternal area and towards the left side of the chest. Took 2 doses of lisinopril. He was given four aspirins in the ED. His pain resolved. He also mentioned that he went to play golf yesterday and when he was exerting himself he could feel some of the pressure as well and at that time he got a little short of breath. He reported a cough for the previos 4 weeks. Denied any hemoptysis. Denied any expectoration. The cough is  getting better. Denied any palpitations recently. Had some nausea, but denied any emesis. Has history of acid reflux. Denied any leg swelling. Denied similar symptoms in the past. Denied any chills. He may have had a fever a few days prior but was not very sure. He also reported that 3-4 days prior, he experienced some burning sensation in the urine and started taking Bactrim on his own. He reported about 4 weeks prio he went to see his PCP because he was having joint pains and body aches, which was attributed to Levaquin, which he was taking for UTI. He was given a course of prednisone and his symptoms are improved.   Hospital Course:  1 chest pressure: This is in the known setting of CAD. His  troponins  negative. His EKG shows nonspecific changes.  Repeat EKG without changes.. Echo done yeilding EF 55-60% and no wall motion abnormality.  Patient is 14 years out from his CABG. Pt seen by cardiology and it was opined that stress test will be done OP . Pt experienced no further CP. Follow up with cardiology 07/30/12.   #2 dizziness with mild dehydration: BP drop from sitting to standing but HR stable. Likely related to decreased po intake, physical exertion. Resolved with IV fluids. On discharge pt ambulating with steady gait independently.   #3 acute on chronic renal failure with hyponatremia: likely related to decreased po intake and meds. Creatinine 2.2 on admission and 1.7 on discharge. Sodium level 136 on discharge. Will  continue to hold lisinopril per  cardiology. They will assess need for resuming on 07/30/12 at his follow up appointment. He will see PCP 07/24/12 for BMET and BP check.  #4 history of CAD, status post CABG: See #1.   #5 history of frequent UTIs: Due to his acute renal failure Bactrim held during hospitalization. UA  did not suggest any infection. Denies dysuria at discharge.  Procedures: none Consultations:  cardiology  Discharge Exam: Filed Vitals:   07/17/12 1400 07/17/12  2138 07/18/12 0630 07/18/12 0910  BP: 128/72 118/74 115/67   Pulse: 73 61 60   Temp: 98.2 F (36.8 C)  97.9 F (36.6 C)   TempSrc: Oral Oral Oral   Resp: 20 18 20    Height:      Weight:      SpO2: 97% 96% 97% 98%    General: alert NAD Cardiovascular: RRR No MGR No LE edema Respiratory: normal effort BS CTA bilaterally. No wheeze or rhonchi Abdomen: soft +BS non-tender to palpation  Discharge Instructions   Future Appointments Provider Department Dept Phone   07/30/2012 1:00 PM Jodelle Gross, NP [facility] [place] [phone]       Medication List    STOP taking these medications       lisinopril 10 MG tablet  Commonly known as:  PRINIVIL,ZESTRIL     sulfamethoxazole-trimethoprim 800-160 MG per tablet  Commonly known as:  BACTRIM DS      TAKE these medications       aspirin 81 MG EC tablet  Take 81 mg by mouth at bedtime.     Co Q 10 100 MG Caps  Take 100 mg by mouth every morning.     ezetimibe-simvastatin 10-20 MG per tablet  Commonly known as:  VYTORIN  Take 1 tablet by mouth at bedtime.     FLOMAX 0.4 MG Caps  Generic drug:  tamsulosin  Take 0.4 mg by mouth daily.     ibuprofen 200 MG tablet  Commonly known as:  ADVIL,MOTRIN  Take 200 mg by mouth daily as needed. Pain     multivitamin with minerals Tabs  Take 1 tablet by mouth every morning.     omeprazole 20 MG capsule  Commonly known as:  PRILOSEC  Take 20 mg by mouth 2 (two) times daily.     sildenafil 50 MG tablet  Commonly known as:  VIAGRA  Take 50 mg by mouth daily as needed.           Follow-up Information   Follow up with Colette Ribas, MD. (As needed)    Contact information:   [address] [place] [address] [phone]       Follow up with Joni Reining, NP. (1 pm)    Contact information:   [facility] [address] [place] [address] [phone]        The results of significant diagnostics  from this hospitalization (including imaging, microbiology, ancillary and laboratory) are listed below for reference.    Significant Diagnostic Studies: Dg Chest 2 View  07/16/2012  *RADIOLOGY REPORT*  Clinical Data: Cough and congestion for 3-4 weeks, chest pain, history hypertension, coronary disease post CABG in 2000  CHEST - 2 VIEW  Comparison: 10/05/2004  Findings: Upper normal heart size post CABG. Small hiatal hernia. Mediastinal contours and pulmonary vascular otherwise normal. Lungs clear. No pleural effusion or pneumothorax. No acute osseous findings.  IMPRESSION: Small hiatal hernia. Post CABG. No acute abnormalities.   Original Report  Authenticated By: Ulyses Southward, M.D.     Microbiology: No results found for this or any previous visit (from the past 240 hour(s)).   Labs: Basic Metabolic Panel:  Recent Labs Lab 07/16/12 1555 07/17/12 0247 07/18/12 0537  NA 129* 134* 136  K 4.7 4.9 5.1  CL 95* 101 101  CO2 25 26 27   GLUCOSE 100* 118* 103*  BUN 24* 20 18  CREATININE 2.02* 1.87* 1.79*  CALCIUM 9.6 9.0 9.6   Liver Function Tests:  Recent Labs Lab 07/17/12 0247  AST 26  ALT 23  ALKPHOS 54  BILITOT 0.8  PROT 6.0  ALBUMIN 3.0*   No results found for this basename: LIPASE, AMYLASE,  in the last 168 hours No results found for this basename: AMMONIA,  in the last 168 hours CBC:  Recent Labs Lab 07/16/12 1555 07/17/12 0247  WBC 10.8* 7.6  HGB 14.5 13.6  HCT 41.8 39.6  MCV 96.1 96.8  PLT 161 146*   Cardiac Enzymes:  Recent Labs Lab 07/16/12 1555 07/16/12 2113 07/17/12 0246 07/17/12 1057  TROPONINI <0.30 <0.30 <0.30 <0.30   BNP: BNP (last 3 results) No results found for this basename: PROBNP,  in the last 8760 hours CBG: No results found for this basename: GLUCAP,  in the last 168 hours     Signed:  Gwenyth Bender  [facility] 07/18/2012, 10:28 AM

## 2012-07-18 NOTE — Care Management Note (Signed)
    Page 1 of 1   07/18/2012     11:38:30 AM   CARE MANAGEMENT NOTE 07/18/2012  Patient:  [name], [name]   Account Number:  [id number]  Date Initiated:  07/18/2012  Documentation initiated by:  Rosemary Holms  Subjective/Objective Assessment:   DC home no needs     Action/Plan:   Anticipated DC Date:  07/18/2012   Anticipated DC Plan:  HOME/SELF CARE      DC Planning Services  CM consult      Choice offered to / List presented to:             Status of service:  Completed, signed off Medicare Important Message given?  NA - LOS <3 / Initial given by admissions (If response is "NO", the following Medicare IM given date fields will be blank) Date Medicare IM given:   Date Additional Medicare IM given:    Discharge Disposition:  HOME/SELF CARE  Per UR Regulation:    If discussed at Long Length of Stay Meetings, dates discussed:    Comments:  07/18/12 Rosemary Holms RN BSN CM

## 2012-07-18 NOTE — Progress Notes (Signed)
Patient seen, independently examined and chart reviewed. I agree with exam, assessment and plan discussed with Toya Smothers, NP.  Interval history: Although stress testing was initially planned as an inpatient, upon further consideration cardiology has deferred this to the outpatient setting. Clear for discharge by cardiology.  Subjective: Feels well. No problems..  Objective: Afebrile, vital signs stable Regular rate and rhythm.  Labs: 2-D echocardiogram reveals normal LV function with no wall motion abnormalities. Creatinine decreased to 1.79  Acute issues:  Chest pain, resolved  Acute renal failure superimposed on chronic kidney disease, improving  Dehydration, resolved  Plan:  Followup with cardiology in 2 weeks  Continue aspirin.  Reevaluate need to restart ACE inhibitor as an outpatient on followup  Comment: We discussed chronic kidney disease which appears to have been stable since 2009 with a baseline approximately 1.5. This can be followed up in the outpatient setting.  Brendia Sacks, MD [facility] [phone]

## 2012-07-18 NOTE — Progress Notes (Signed)
SUBJECTIVE: Feeling much better no recurrence of chest pain dyspnea or weakness.  Principal Problem:   Chest pain on exertion Active Problems:   CHRONIC KIDNEY DISEASE-STAGE II   Arteriosclerotic cardiovascular disease (ASCVD)   GERD (gastroesophageal reflux disease)   Hypertension   BPH (benign prostatic hypertrophy)   Acute on chronic renal failure   Hyponatremia   LABS: Basic Metabolic Panel:  Recent Labs  95/62/13 0247 07/18/12 0537  NA 134* 136  K 4.9 5.1  CL 101 101  CO2 26 27  GLUCOSE 118* 103*  BUN 20 18  CREATININE 1.87* 1.79*  CALCIUM 9.0 9.6   Liver Function Tests:  Recent Labs  07/17/12 0247  AST 26  ALT 23  ALKPHOS 54  BILITOT 0.8  PROT 6.0  ALBUMIN 3.0*   CBC:  Recent Labs  07/16/12 1555 07/17/12 0247  WBC 10.8* 7.6  HGB 14.5 13.6  HCT 41.8 39.6  MCV 96.1 96.8  PLT 161 146*   Cardiac Enzymes:  Recent Labs  07/16/12 2113 07/17/12 0246 07/17/12 1057  TROPONINI <0.30 <0.30 <0.30   Thyroid Function Tests:  Recent Labs  07/16/12 2113  TSH 1.518    RADIOLOGY: Dg Chest 2 View  07/16/2012  *RADIOLOGY REPORT*  Clinical Data: Cough and congestion for 3-4 weeks, chest pain, history hypertension, coronary disease post CABG in 2000  CHEST - 2 VIEW  Comparison: 10/05/2004  Findings: Upper normal heart size post CABG. Small hiatal hernia. Mediastinal contours and pulmonary vascular otherwise normal. Lungs clear. No pleural effusion or pneumothorax. No acute osseous findings.  IMPRESSION: Small hiatal hernia. Post CABG. No acute abnormalities.   Original Report Authenticated By: Ulyses Southward, M.D.      PHYSICAL EXAM BP 115/67  Pulse 60  Temp(Src) 97.9 F (36.6 C) (Oral)  Resp 20  Ht 5\' 10"  (1.778 m)  Wt 172 lb 3.2 oz (78.109 kg)  BMI 24.71 kg/m2  SpO2 98% General: Well developed, well nourished, in no acute distress Head: Eyes PERRLA, No xanthomas.   Normal cephalic and atramatic  Lungs: Clear bilaterally to auscultation and  percussion. Heart: HRRR S1 S2, No MRG .  Pulses are 2+ & equal.            No carotid bruit. No JVD.  No abdominal bruits. No femoral bruits. Abdomen: Bowel sounds are positive, abdomen soft and non-tender without masses or                  Hernia's noted. Msk:  Back normal, normal gait. Normal strength and tone for age. Extremities: No clubbing, cyanosis or edema.  DP +1 Neuro: Alert and oriented X 3. Psych:  Good affect, responds appropriately  TELEMETRY: Reviewed telemetry pt in: NSR   ASSESSMENT AND PLAN:  1. Chest Pain: Patient has known history of coronary artery disease status post four-vessel coronary artery bypass grafting in 2000. He has had no recurrence of chest discomfort. Cardiac enzymes are found be negative. Echocardiogram was completed and filled normal LV function without wall motion abnormalities. Patient can return home from a cardiology standpoint. I have made a followup appointment for him in 2 weeks and we will discuss further ischemic testing should that be necessary if he remains symptomatic posthospitalization. No changes in medication regimen at this time. Continue aspirin.  2. Acute on chronic renal failure: Creatinine was an elevated creatinine of greater than 2, has been on Bactrim for several weeks for UTI. This is been discontinued. He has been given IV hydration during hospitalization.  With slow improvement of creatinine to 1.79. His baseline creatinine on review of prior labs it is 1.4. BMET should be completed prior to next visit.  3. Hypertension: Patient's blood pressure is well-controlled. It is noted on H&P that he had taken extra doses of lisinopril at home thinking that his blood pressure was elevated causing him to feel dizzy and weak. ACE inhibitor has not been restarted during hospitalization. Home dose was 10 mg by mouth daily. We will reevaluate need to restart ACE inhibitor as an outpatient on followup. With known history of coronary artery disease  low dose would be recommended. In the setting of dehydration and IV fluid hydration, blood pressure is low normal without antihypertensive at this time. Followup BMET is recommended prior to next visit.  4. Hyperlipidemia: It is possible that muscle aches and pains and generalized fatigue could be related to statin, however dehydration is likely cause. We will continue his simvastatin as directed.   Bettey Mare. Lyman Bishop NP [facility] 07/18/2012, 9:33 AM  Patient seen and examined  Note amended above with data/physical findings.  Agree with assessment CP does not appear to be due to coronary ischemia.  OK to d/c from cardiac standpoint.  Will make sure patient has F/U in clinic to review/reassess.

## 2012-07-22 ENCOUNTER — Telehealth: Payer: Self-pay | Admitting: Adult Health

## 2012-07-22 NOTE — Telephone Encounter (Signed)
Patient states vague chest pressure symptoms that start at 11:00 daily, as prior to and during hospitalization.  States that it starts in his left side and radiates to the front.  States he coughed hard and may be from that.  According to consult note, it was determined that his chest pressure was not cardiac in nature.  Has an appt with PCP tomorrow am and will present this to him.  Advised him that if his PCP felt he needed to be seen in the office earlier than previously scheduled appointment on Monday, we could see him on Friday.

## 2012-07-22 NOTE — Telephone Encounter (Signed)
Patient was advised to keep appt with PCP tomorrow

## 2012-07-22 NOTE — Telephone Encounter (Signed)
PT TOOK BP 172/88

## 2012-07-22 NOTE — Telephone Encounter (Signed)
PT STATES THAT HE HAS PRESSURE IN CHEST AGAIN. HE HAS APPT [facility] WITH K LAWRENCE ON 4/16

## 2012-07-24 ENCOUNTER — Other Ambulatory Visit (HOSPITAL_COMMUNITY): Payer: Self-pay | Admitting: Physician Assistant

## 2012-07-24 DIAGNOSIS — N329 Bladder disorder, unspecified: Secondary | ICD-10-CM

## 2012-07-24 DIAGNOSIS — N183 Chronic kidney disease, stage 3 unspecified: Secondary | ICD-10-CM | POA: Diagnosis not present

## 2012-07-24 DIAGNOSIS — R109 Unspecified abdominal pain: Secondary | ICD-10-CM | POA: Diagnosis not present

## 2012-07-24 DIAGNOSIS — I10 Essential (primary) hypertension: Secondary | ICD-10-CM

## 2012-07-24 DIAGNOSIS — Z6827 Body mass index (BMI) 27.0-27.9, adult: Secondary | ICD-10-CM | POA: Diagnosis not present

## 2012-07-28 ENCOUNTER — Ambulatory Visit (HOSPITAL_COMMUNITY)
Admission: RE | Admit: 2012-07-28 | Discharge: 2012-07-28 | Disposition: A | Discharge status: Home / Self Care | Payer: Medicare Other | Source: Ambulatory Visit | Attending: Physician Assistant | Admitting: Physician Assistant

## 2012-07-28 DIAGNOSIS — K5730 Diverticulosis of large intestine without perforation or abscess without bleeding: Secondary | ICD-10-CM | POA: Insufficient documentation

## 2012-07-28 DIAGNOSIS — Q619 Cystic kidney disease, unspecified: Secondary | ICD-10-CM | POA: Insufficient documentation

## 2012-07-28 DIAGNOSIS — R1031 Right lower quadrant pain: Secondary | ICD-10-CM | POA: Diagnosis not present

## 2012-07-28 DIAGNOSIS — I10 Essential (primary) hypertension: Secondary | ICD-10-CM | POA: Diagnosis not present

## 2012-07-28 DIAGNOSIS — N183 Chronic kidney disease, stage 3 unspecified: Secondary | ICD-10-CM

## 2012-07-28 DIAGNOSIS — Z951 Presence of aortocoronary bypass graft: Secondary | ICD-10-CM | POA: Insufficient documentation

## 2012-07-28 DIAGNOSIS — N329 Bladder disorder, unspecified: Secondary | ICD-10-CM

## 2012-07-28 DIAGNOSIS — R109 Unspecified abdominal pain: Secondary | ICD-10-CM

## 2012-07-30 ENCOUNTER — Encounter: Payer: Self-pay | Admitting: Adult Health

## 2012-07-30 ENCOUNTER — Ambulatory Visit (INDEPENDENT_AMBULATORY_CARE_PROVIDER_SITE_OTHER): Payer: Managed Care, Other (non HMO) | Admitting: Adult Health

## 2012-07-30 VITALS — BP 133/70 | HR 80 | Ht 70.0 in | Wt 183.4 lb

## 2012-07-30 DIAGNOSIS — E785 Hyperlipidemia, unspecified: Secondary | ICD-10-CM | POA: Diagnosis not present

## 2012-07-30 DIAGNOSIS — I2510 Atherosclerotic heart disease of native coronary artery without angina pectoris: Secondary | ICD-10-CM

## 2012-07-30 DIAGNOSIS — I7090 Unspecified atherosclerosis: Secondary | ICD-10-CM

## 2012-07-30 DIAGNOSIS — I10 Essential (primary) hypertension: Secondary | ICD-10-CM | POA: Diagnosis not present

## 2012-07-30 NOTE — Progress Notes (Signed)
HPI Mr. Zuercher is a very talkative 67 year old patient of Dr. Dietrich Pates we saw for chest pain during hospitalization for same. Patient has a history of CAD status post coronary artery bypass grafting, hypertension and hyperlipidemia. The patient has since been seen by his primary care physician for recurrent pain in his lower back and right upper quadrant. A CT scan was completed with results pending. Lisinopril had been discontinued and he was started on amlodipine posthospitalization. Blood pressure has been well-controlled. The patient offers no further complaints of chest pain. Main complaint is right upper quadrant pain only. He continues to be active it is gone [place] hunting without any recurrence of discomfort.  Allergies  Allergen Reactions  . Codeine Nausea And Vomiting  . Demerol (Meperidine) Nausea And Vomiting  . Nalbuphine Nausea And Vomiting    Current Outpatient Prescriptions  Medication Sig Dispense Refill  . amLODipine (NORVASC) 10 MG tablet Take 10 mg by mouth daily.      Marland Kitchen aspirin 81 MG EC tablet Take 81 mg by mouth at bedtime.       . Coenzyme Q10 (CO Q 10) 100 MG CAPS Take 100 mg by mouth every morning.       . ezetimibe-simvastatin (VYTORIN) 10-20 MG per tablet Take 1 tablet by mouth at bedtime.      Marland Kitchen ibuprofen (ADVIL,MOTRIN) 200 MG tablet Take 200 mg by mouth daily as needed. Pain      . Multiple Vitamin (MULTIVITAMIN WITH MINERALS) TABS Take 1 tablet by mouth every morning.      Marland Kitchen omeprazole (PRILOSEC) 20 MG capsule Take 20 mg by mouth 2 (two) times daily.      . sildenafil (VIAGRA) 50 MG tablet Take 50 mg by mouth daily as needed.      . Tamsulosin HCl (FLOMAX) 0.4 MG CAPS Take 0.4 mg by mouth daily.        No current facility-administered medications for this visit.    Past Medical History  Diagnosis Date  . Arteriosclerotic cardiovascular disease (ASCVD)     CABG in 08/1998  . GERD (gastroesophageal reflux disease)     s/p espohageal dilatatin and repair of  hiatal hernia  . Hypertension   . Hyperlipidemia   . BPH (benign prostatic hypertrophy)     h/x of prostatis/ UTI in 2006  . Sigmoid diverticulosis   . PONV (postoperative nausea and vomiting)   . Chronic kidney disease, stage II (mild) 2010    Creatinine of 1.5 in 2010  . Coronary artery disease     Past Surgical History  Procedure Laterality Date  . Cholecystectomy  1990s  . Hiatal hernia repair  1995  . Lumbar laminectomy  1999  . Hemorrhoid surgery  2007  . Coronary artery bypass graft  2000  . Colonoscopy  03/17/2012    [place]; negative screening study    MWU:XLKGMW of systems complete and found to be negative unless listed above  PHYSICAL EXAM BP 133/70  Pulse 80  Ht 5\' 10"  (1.778 m)  Wt 183 lb 6.4 oz (83.19 kg)  BMI 26.32 kg/m2 General: Well developed, well nourished, in no acute distress Head: Eyes PERRLA, No xanthomas.   Normal cephalic and atramatic  Lungs: Clear bilaterally to auscultation and percussion. Heart: HRRR S1 S2, without MRG.  Pulses are 2+ & equal.            No carotid bruit. No JVD.  No abdominal bruits. No femoral bruits. Abdomen: Bowel sounds are positive, abdomen soft and  non-tender without masses or                  Hernia's noted. Msk:  Back normal, normal gait. Normal strength and tone for age. Extremities: No clubbing, cyanosis or edema.  DP +1 Neuro: Alert and oriented X 3. Psych:  Good affect, responds appropriately  ZOX:[id number] RBBB rate of 79 bpm.  ASSESSMENT AND PLAN

## 2012-07-30 NOTE — Assessment & Plan Note (Signed)
He said no recurrence of cardiac symptoms. Cardiac enzymes are found be negative during hospitalization. EKG showed no evidence of ACS. He continues medically compliant. We will concentrate on risk management only, to include statin and blood pressure Chol. We will see the patient in a year unless he becomes symptomatic. No cardiac testing is planned.

## 2012-07-30 NOTE — Patient Instructions (Addendum)
Your physician recommends that you schedule a follow-up appointment in: As needed  

## 2012-07-30 NOTE — Assessment & Plan Note (Signed)
Blood pressure is well-controlled currently on amlodipine. He was taken off of lisinopril secondary to chronic kidney disease. No changes in his medication regimen

## 2012-07-30 NOTE — Progress Notes (Deleted)
Name: [name]    DOB: 01/25/1946  Age: 67 y.o.  MR#: [id number]       PCP:  Colette Ribas, MD      Insurance: Payor: MEDICARE  Plan: MEDICARE PART A AND B  Product Type: *No Product type*    CC:   No chief complaint on file.   VS Filed Vitals:   07/30/12 1302  BP: 133/70  Pulse: 80  Height: 5\' 10"  (1.778 m)  Weight: 183 lb 6.4 oz (83.19 kg)    Weights Current Weight  07/30/12 183 lb 6.4 oz (83.19 kg)  07/16/12 172 lb 3.2 oz (78.109 kg)  04/14/12 185 lb 6.4 oz (84.097 kg)    Blood Pressure  BP Readings from Last 3 Encounters:  07/30/12 133/70  07/18/12 115/67  04/14/12 150/80     Admit date:  (Not on file) Last encounter with RMR:  07/22/2012   Allergy Codeine; Demerol; and Nalbuphine  Current Outpatient Prescriptions  Medication Sig Dispense Refill  . amLODipine (NORVASC) 10 MG tablet Take 10 mg by mouth daily.      Marland Kitchen aspirin 81 MG EC tablet Take 81 mg by mouth at bedtime.       . Coenzyme Q10 (CO Q 10) 100 MG CAPS Take 100 mg by mouth every morning.       . ezetimibe-simvastatin (VYTORIN) 10-20 MG per tablet Take 1 tablet by mouth at bedtime.      Marland Kitchen ibuprofen (ADVIL,MOTRIN) 200 MG tablet Take 200 mg by mouth daily as needed. Pain      . Multiple Vitamin (MULTIVITAMIN WITH MINERALS) TABS Take 1 tablet by mouth every morning.      Marland Kitchen omeprazole (PRILOSEC) 20 MG capsule Take 20 mg by mouth 2 (two) times daily.      . sildenafil (VIAGRA) 50 MG tablet Take 50 mg by mouth daily as needed.      . Tamsulosin HCl (FLOMAX) 0.4 MG CAPS Take 0.4 mg by mouth daily.        No current facility-administered medications for this visit.    Discontinued Meds:   There are no discontinued medications.  Patient Active Problem List  Diagnosis  . CHRONIC KIDNEY DISEASE-STAGE II  . Arteriosclerotic cardiovascular disease (ASCVD)  . GERD (gastroesophageal reflux disease)  . Hypertension  . Hyperlipidemia  . BPH (benign prostatic hypertrophy)  . Chest pain on exertion  .  Acute on chronic renal failure  . Hyponatremia    LABS    Component Value Date/Time   NA 136 07/18/2012 0537   NA 134* 07/17/2012 0247   NA 129* 07/16/2012 1555   K 5.1 07/18/2012 0537   K 4.9 07/17/2012 0247   K 4.7 07/16/2012 1555   CL 101 07/18/2012 0537   CL 101 07/17/2012 0247   CL 95* 07/16/2012 1555   CO2 27 07/18/2012 0537   CO2 26 07/17/2012 0247   CO2 25 07/16/2012 1555   GLUCOSE 103* 07/18/2012 0537   GLUCOSE 118* 07/17/2012 0247   GLUCOSE 100* 07/16/2012 1555   BUN 18 07/18/2012 0537   BUN 20 07/17/2012 0247   BUN 24* 07/16/2012 1555   CREATININE 1.79* 07/18/2012 0537   CREATININE 1.87* 07/17/2012 0247   CREATININE 2.02* 07/16/2012 1555   CREATININE 1.48* 04/14/2012 1409   CALCIUM 9.6 07/18/2012 0537   CALCIUM 9.0 07/17/2012 0247   CALCIUM 9.6 07/16/2012 1555   GFRNONAA 38* 07/18/2012 0537   GFRNONAA 36* 07/17/2012 0247   GFRNONAA 33* 07/16/2012 1555  GFRAA 44* 07/18/2012 0537   GFRAA 42* 07/17/2012 0247   GFRAA 38* 07/16/2012 1555   CMP     Component Value Date/Time   NA 136 07/18/2012 0537   K 5.1 07/18/2012 0537   CL 101 07/18/2012 0537   CO2 27 07/18/2012 0537   GLUCOSE 103* 07/18/2012 0537   BUN 18 07/18/2012 0537   CREATININE 1.79* 07/18/2012 0537   CREATININE 1.48* 04/14/2012 1409   CALCIUM 9.6 07/18/2012 0537   PROT 6.0 07/17/2012 0247   ALBUMIN 3.0* 07/17/2012 0247   AST 26 07/17/2012 0247   ALT 23 07/17/2012 0247   ALKPHOS 54 07/17/2012 0247   BILITOT 0.8 07/17/2012 0247   GFRNONAA 38* 07/18/2012 0537   GFRAA 44* 07/18/2012 0537       Component Value Date/Time   WBC 7.6 07/17/2012 0247   WBC 10.8* 07/16/2012 1555   WBC 6.5 04/14/2012 1409   HGB 13.6 07/17/2012 0247   HGB 14.5 07/16/2012 1555   HGB 14.1 04/14/2012 1409   HCT 39.6 07/17/2012 0247   HCT 41.8 07/16/2012 1555   HCT 41.6 04/14/2012 1409   MCV 96.8 07/17/2012 0247   MCV 96.1 07/16/2012 1555   MCV 95.6 04/14/2012 1409    Lipid Panel     Component Value Date/Time   CHOL 150 04/04/2011 1343   TRIG 116 04/04/2011 1343   HDL 45 04/04/2011 1343   CHOLHDL 3.3  04/04/2011 1343   VLDL 23 04/04/2011 1343   LDLCALC 82 04/04/2011 1343    ABG No results found for this basename: phart, pco2, pco2art, po2, po2art, hco3, tco2, acidbasedef, o2sat     Lab Results  Component Value Date   TSH 1.518 07/16/2012   BNP (last 3 results) No results found for this basename: PROBNP,  in the last 8760 hours Cardiac Panel (last 3 results) No results found for this basename: CKTOTAL, CKMB, TROPONINI, RELINDX,  in the last 72 hours  Iron/TIBC/Ferritin No results found for this basename: iron, tibc, ferritin     EKG Orders placed in visit on 07/30/12  . EKG 12-LEAD     Prior Assessment and Plan Problem List as of 07/30/2012     ICD-9-CM     Cardiology Problems   Arteriosclerotic cardiovascular disease (ASCVD)   Last Assessment & Plan   04/14/2012 Office Visit Written 04/14/2012  5:00 PM by Kathlen Brunswick, MD     The patient has done extremely well, now more than a decade following CABG surgery.  We will continue to optimally treat cardiovascular risk factors.    Hypertension   Last Assessment & Plan   04/14/2012 Office Visit Edited 04/14/2012  5:02 PM by Kathlen Brunswick, MD     Occasional systolic blood pressure to 150 during the past 3 years.  Patient will continue to monitor and will report any sustained elevation in blood pressure.    Hyperlipidemia   Last Assessment & Plan   04/14/2012 Office Visit Written 04/14/2012  2:01 PM by Kathlen Brunswick, MD     Excellent lipid profile since therapy instituted.  Current medication will be continued.      Other   CHRONIC KIDNEY DISEASE-STAGE II   Last Assessment & Plan   04/14/2012 Office Visit Edited 04/14/2012  5:05 PM by Kathlen Brunswick, MD     Stable mild renal dysfunction.  Patient has limited use of nonsteroidals to the extent possible.  He has been treated with low to moderate dose lisinopril, which may be resulting  in some increase in creatinine, but is the appropriate therapy for him.  We  will continue to monitor renal function.    GERD (gastroesophageal reflux disease)   BPH (benign prostatic hypertrophy)   Chest pain on exertion   Acute on chronic renal failure   Hyponatremia       Imaging: Ct Abdomen Wo Contrast  07/28/2012  *RADIOLOGY REPORT*  Clinical Data: Right upper quadrant right flank pain for 3 weeks, stage III chronic kidney disease, hypertension, coronary artery disease post CABG  CT ABDOMEN WITHOUT CONTRAST  Technique:  Multidetector CT imaging of the abdomen was performed following the standard protocol without IV contrast. Sagittal and coronal MPR images reconstructed from axial data set.  Comparison: 03/27/2006  Findings: Postsurgical changes of CABG. Lung bases clear. Moderate to large hiatal hernia. Gallbladder surgically absent. Within limits of a nonenhanced exam no focal abnormalities of the liver, spleen, pancreas, or adrenal glands identified. Symmetric renal sizes with interval decrease in size of a mildly complicated cyst at posterior mid left kidney, 3.1 x 2.8 cm image 33, previously 3.9 x 3.8 cm.  Scattered proximal descending colonic diverticula without evidence of diverticulitis. Scattered mild fatty deposition within colonic wall. Stomach and bowel loops otherwise normal appearance. Minimal atherosclerotic calcification aorta. No mass, adenopathy, free fluid, inflammatory process, or acute osseous findings. Pelvis not imaged.  IMPRESSION: Descending colonic diverticulosis without evidence of diverticulitis. Interval decrease in size of a mildly complicated left renal cyst. Moderate to large hiatal hernia. No acute upper abdominal abnormalities.   Original Report Authenticated By: Ulyses Southward, M.D.    Dg Chest 2 View  07/16/2012  *RADIOLOGY REPORT*  Clinical Data: Cough and congestion for 3-4 weeks, chest pain, history hypertension, coronary disease post CABG in 2000  CHEST - 2 VIEW  Comparison: 10/05/2004  Findings: Upper normal heart size post CABG. Small  hiatal hernia. Mediastinal contours and pulmonary vascular otherwise normal. Lungs clear. No pleural effusion or pneumothorax. No acute osseous findings.  IMPRESSION: Small hiatal hernia. Post CABG. No acute abnormalities.   Original Report Authenticated By: Ulyses Southward, M.D.

## 2012-07-30 NOTE — Assessment & Plan Note (Signed)
He is followed by primary care physician. Ongoing labs annually are recommended. He is to continue on statin therapy with low cholesterol diet.

## 2012-08-26 ENCOUNTER — Ambulatory Visit (INDEPENDENT_AMBULATORY_CARE_PROVIDER_SITE_OTHER): Payer: Medicare Other | Admitting: Urology

## 2012-08-26 DIAGNOSIS — N179 Acute kidney failure, unspecified: Secondary | ICD-10-CM | POA: Diagnosis not present

## 2012-08-26 DIAGNOSIS — R972 Elevated prostate specific antigen [PSA]: Secondary | ICD-10-CM | POA: Diagnosis not present

## 2012-08-26 DIAGNOSIS — N3000 Acute cystitis without hematuria: Secondary | ICD-10-CM

## 2012-08-26 DIAGNOSIS — N400 Enlarged prostate without lower urinary tract symptoms: Secondary | ICD-10-CM | POA: Diagnosis not present

## 2012-09-09 DIAGNOSIS — R609 Edema, unspecified: Secondary | ICD-10-CM | POA: Diagnosis not present

## 2012-09-09 DIAGNOSIS — I10 Essential (primary) hypertension: Secondary | ICD-10-CM | POA: Diagnosis not present

## 2012-09-09 DIAGNOSIS — H1045 Other chronic allergic conjunctivitis: Secondary | ICD-10-CM | POA: Diagnosis not present

## 2012-09-09 DIAGNOSIS — Z6827 Body mass index (BMI) 27.0-27.9, adult: Secondary | ICD-10-CM | POA: Diagnosis not present

## 2012-09-26 DIAGNOSIS — R609 Edema, unspecified: Secondary | ICD-10-CM | POA: Diagnosis not present

## 2012-09-26 DIAGNOSIS — I10 Essential (primary) hypertension: Secondary | ICD-10-CM | POA: Diagnosis not present

## 2012-09-26 DIAGNOSIS — L259 Unspecified contact dermatitis, unspecified cause: Secondary | ICD-10-CM | POA: Diagnosis not present

## 2012-09-26 DIAGNOSIS — M159 Polyosteoarthritis, unspecified: Secondary | ICD-10-CM | POA: Diagnosis not present

## 2012-09-26 DIAGNOSIS — Z6827 Body mass index (BMI) 27.0-27.9, adult: Secondary | ICD-10-CM | POA: Diagnosis not present

## 2012-10-07 ENCOUNTER — Ambulatory Visit (INDEPENDENT_AMBULATORY_CARE_PROVIDER_SITE_OTHER): Payer: Medicare Other | Admitting: Urology

## 2012-10-07 DIAGNOSIS — N400 Enlarged prostate without lower urinary tract symptoms: Secondary | ICD-10-CM | POA: Diagnosis not present

## 2012-10-07 DIAGNOSIS — R972 Elevated prostate specific antigen [PSA]: Secondary | ICD-10-CM | POA: Diagnosis not present

## 2012-10-21 ENCOUNTER — Other Ambulatory Visit: Payer: Self-pay | Admitting: Urology

## 2012-10-21 DIAGNOSIS — R972 Elevated prostate specific antigen [PSA]: Secondary | ICD-10-CM

## 2012-11-19 ENCOUNTER — Other Ambulatory Visit: Payer: Self-pay

## 2012-11-27 ENCOUNTER — Other Ambulatory Visit (HOSPITAL_COMMUNITY): Payer: Self-pay | Admitting: Family Medicine

## 2012-11-27 DIAGNOSIS — N63 Unspecified lump in unspecified breast: Secondary | ICD-10-CM | POA: Diagnosis not present

## 2012-11-27 DIAGNOSIS — Z6826 Body mass index (BMI) 26.0-26.9, adult: Secondary | ICD-10-CM | POA: Diagnosis not present

## 2012-12-02 ENCOUNTER — Encounter (HOSPITAL_COMMUNITY): Payer: Self-pay

## 2012-12-02 ENCOUNTER — Ambulatory Visit (HOSPITAL_COMMUNITY)
Admission: RE | Admit: 2012-12-02 | Discharge: 2012-12-02 | Disposition: A | Discharge status: Home / Self Care | Payer: Medicare Other | Source: Ambulatory Visit | Attending: Urology | Admitting: Urology

## 2012-12-02 ENCOUNTER — Other Ambulatory Visit: Payer: Self-pay | Admitting: Urology

## 2012-12-02 DIAGNOSIS — R972 Elevated prostate specific antigen [PSA]: Secondary | ICD-10-CM | POA: Insufficient documentation

## 2012-12-02 MED ORDER — LIDOCAINE HCL (PF) 2 % IJ SOLN
INTRAMUSCULAR | Status: AC
  Administered 2012-12-02: 10 mL
  Filled 2012-12-02: qty 10

## 2012-12-02 MED ORDER — LIDOCAINE HCL (PF) 2 % IJ SOLN
10.0000 mL | Freq: Once | INTRAMUSCULAR | Status: AC
  Filled 2012-12-02: qty 10

## 2012-12-02 MED ORDER — GENTAMICIN SULFATE 40 MG/ML IJ SOLN: 160.0000 mg | Freq: Once | INTRAMUSCULAR | Status: AC

## 2012-12-02 MED ORDER — GENTAMICIN SULFATE 40 MG/ML IJ SOLN
INTRAMUSCULAR | Status: AC
  Administered 2012-12-02: 160 mg via INTRAMUSCULAR
  Filled 2012-12-02: qty 4

## 2012-12-17 ENCOUNTER — Ambulatory Visit (HOSPITAL_COMMUNITY)
Admission: RE | Admit: 2012-12-17 | Discharge: 2012-12-17 | Disposition: A | Discharge status: Home / Self Care | Payer: Medicare Other | Source: Ambulatory Visit | Attending: Family Medicine | Admitting: Family Medicine

## 2012-12-17 DIAGNOSIS — N63 Unspecified lump in unspecified breast: Secondary | ICD-10-CM | POA: Insufficient documentation

## 2012-12-17 DIAGNOSIS — N62 Hypertrophy of breast: Secondary | ICD-10-CM | POA: Diagnosis not present

## 2012-12-22 ENCOUNTER — Encounter: Payer: Self-pay | Admitting: Urology

## 2013-02-19 ENCOUNTER — Other Ambulatory Visit: Payer: Self-pay

## 2013-02-27 DIAGNOSIS — M2550 Pain in unspecified joint: Secondary | ICD-10-CM | POA: Diagnosis not present

## 2013-02-27 DIAGNOSIS — R7301 Impaired fasting glucose: Secondary | ICD-10-CM | POA: Diagnosis not present

## 2013-02-27 DIAGNOSIS — Z6827 Body mass index (BMI) 27.0-27.9, adult: Secondary | ICD-10-CM | POA: Diagnosis not present

## 2013-04-07 ENCOUNTER — Ambulatory Visit (INDEPENDENT_AMBULATORY_CARE_PROVIDER_SITE_OTHER): Payer: Medicare Other | Admitting: Urology

## 2013-04-07 DIAGNOSIS — R972 Elevated prostate specific antigen [PSA]: Secondary | ICD-10-CM | POA: Diagnosis not present

## 2013-04-07 DIAGNOSIS — N529 Male erectile dysfunction, unspecified: Secondary | ICD-10-CM

## 2013-04-07 DIAGNOSIS — N179 Acute kidney failure, unspecified: Secondary | ICD-10-CM | POA: Diagnosis not present

## 2013-04-07 DIAGNOSIS — N400 Enlarged prostate without lower urinary tract symptoms: Secondary | ICD-10-CM

## 2013-05-01 DIAGNOSIS — L600 Ingrowing nail: Secondary | ICD-10-CM | POA: Diagnosis not present

## 2013-05-07 DIAGNOSIS — M2550 Pain in unspecified joint: Secondary | ICD-10-CM | POA: Diagnosis not present

## 2013-05-07 DIAGNOSIS — M064 Inflammatory polyarthropathy: Secondary | ICD-10-CM | POA: Diagnosis not present

## 2013-05-07 DIAGNOSIS — Z6827 Body mass index (BMI) 27.0-27.9, adult: Secondary | ICD-10-CM | POA: Diagnosis not present

## 2013-05-19 DIAGNOSIS — L600 Ingrowing nail: Secondary | ICD-10-CM | POA: Diagnosis not present

## 2013-06-08 DIAGNOSIS — M19049 Primary osteoarthritis, unspecified hand: Secondary | ICD-10-CM | POA: Diagnosis not present

## 2013-06-21 ENCOUNTER — Emergency Department (HOSPITAL_COMMUNITY)
Admission: EM | Admit: 2013-06-21 | Discharge: 2013-06-21 | Disposition: A | Discharge status: Home / Self Care | Payer: Medicare Other | Attending: Emergency Medicine | Admitting: Emergency Medicine

## 2013-06-21 ENCOUNTER — Encounter (HOSPITAL_COMMUNITY): Payer: Self-pay | Admitting: Emergency Medicine

## 2013-06-21 ENCOUNTER — Emergency Department (HOSPITAL_COMMUNITY): Payer: Medicare Other

## 2013-06-21 DIAGNOSIS — R350 Frequency of micturition: Secondary | ICD-10-CM | POA: Insufficient documentation

## 2013-06-21 DIAGNOSIS — R5381 Other malaise: Secondary | ICD-10-CM | POA: Diagnosis not present

## 2013-06-21 DIAGNOSIS — Z951 Presence of aortocoronary bypass graft: Secondary | ICD-10-CM | POA: Insufficient documentation

## 2013-06-21 DIAGNOSIS — R1084 Generalized abdominal pain: Secondary | ICD-10-CM | POA: Diagnosis not present

## 2013-06-21 DIAGNOSIS — K219 Gastro-esophageal reflux disease without esophagitis: Secondary | ICD-10-CM | POA: Insufficient documentation

## 2013-06-21 DIAGNOSIS — Z8673 Personal history of transient ischemic attack (TIA), and cerebral infarction without residual deficits: Secondary | ICD-10-CM | POA: Diagnosis not present

## 2013-06-21 DIAGNOSIS — Z79899 Other long term (current) drug therapy: Secondary | ICD-10-CM | POA: Diagnosis not present

## 2013-06-21 DIAGNOSIS — I129 Hypertensive chronic kidney disease with stage 1 through stage 4 chronic kidney disease, or unspecified chronic kidney disease: Secondary | ICD-10-CM | POA: Diagnosis not present

## 2013-06-21 DIAGNOSIS — Z7982 Long term (current) use of aspirin: Secondary | ICD-10-CM | POA: Insufficient documentation

## 2013-06-21 DIAGNOSIS — R197 Diarrhea, unspecified: Secondary | ICD-10-CM | POA: Insufficient documentation

## 2013-06-21 DIAGNOSIS — N182 Chronic kidney disease, stage 2 (mild): Secondary | ICD-10-CM | POA: Diagnosis not present

## 2013-06-21 DIAGNOSIS — R109 Unspecified abdominal pain: Secondary | ICD-10-CM

## 2013-06-21 DIAGNOSIS — IMO0002 Reserved for concepts with insufficient information to code with codable children: Secondary | ICD-10-CM | POA: Insufficient documentation

## 2013-06-21 DIAGNOSIS — R5383 Other fatigue: Secondary | ICD-10-CM

## 2013-06-21 DIAGNOSIS — I2510 Atherosclerotic heart disease of native coronary artery without angina pectoris: Secondary | ICD-10-CM | POA: Insufficient documentation

## 2013-06-21 DIAGNOSIS — R42 Dizziness and giddiness: Secondary | ICD-10-CM | POA: Diagnosis not present

## 2013-06-21 DIAGNOSIS — E785 Hyperlipidemia, unspecified: Secondary | ICD-10-CM | POA: Diagnosis not present

## 2013-06-21 DIAGNOSIS — Z87440 Personal history of urinary (tract) infections: Secondary | ICD-10-CM | POA: Insufficient documentation

## 2013-06-21 DIAGNOSIS — Z87448 Personal history of other diseases of urinary system: Secondary | ICD-10-CM | POA: Diagnosis not present

## 2013-06-21 DIAGNOSIS — I10 Essential (primary) hypertension: Secondary | ICD-10-CM | POA: Diagnosis not present

## 2013-06-21 HISTORY — DX: Irritable bowel syndrome, unspecified: K58.9

## 2013-06-21 LAB — URINALYSIS, ROUTINE W REFLEX MICROSCOPIC
Bilirubin Urine: NEGATIVE
Glucose, UA: NEGATIVE mg/dL
Hgb urine dipstick: NEGATIVE
Ketones, ur: NEGATIVE mg/dL
LEUKOCYTES UA: NEGATIVE
Nitrite: NEGATIVE
PH: 6 (ref 5.0–8.0)
Protein, ur: NEGATIVE mg/dL
SPECIFIC GRAVITY, URINE: 1.01 (ref 1.005–1.030)
UROBILINOGEN UA: 0.2 mg/dL (ref 0.0–1.0)

## 2013-06-21 LAB — CBC WITH DIFFERENTIAL/PLATELET
BASOS ABS: 0 10*3/uL (ref 0.0–0.1)
Basophils Relative: 0 % (ref 0–1)
Eosinophils Absolute: 0 10*3/uL (ref 0.0–0.7)
Eosinophils Relative: 0 % (ref 0–5)
HCT: 44.3 % (ref 39.0–52.0)
HEMOGLOBIN: 15.1 g/dL (ref 13.0–17.0)
Lymphocytes Relative: 19 % (ref 12–46)
Lymphs Abs: 1.9 10*3/uL (ref 0.7–4.0)
MCH: 32.8 pg (ref 26.0–34.0)
MCHC: 34.1 g/dL (ref 30.0–36.0)
MCV: 96.1 fL (ref 78.0–100.0)
MONOS PCT: 8 % (ref 3–12)
Monocytes Absolute: 0.8 10*3/uL (ref 0.1–1.0)
NEUTROS ABS: 7.5 10*3/uL (ref 1.7–7.7)
Neutrophils Relative %: 73 % (ref 43–77)
PLATELETS: 199 10*3/uL (ref 150–400)
RBC: 4.61 MIL/uL (ref 4.22–5.81)
RDW: 13.6 % (ref 11.5–15.5)
WBC: 10.2 10*3/uL (ref 4.0–10.5)

## 2013-06-21 LAB — POC OCCULT BLOOD, ED: Fecal Occult Bld: NEGATIVE

## 2013-06-21 LAB — COMPREHENSIVE METABOLIC PANEL
ALBUMIN: 3.6 g/dL (ref 3.5–5.2)
ALK PHOS: 66 U/L (ref 39–117)
ALT: 17 U/L (ref 0–53)
AST: 16 U/L (ref 0–37)
BILIRUBIN TOTAL: 0.9 mg/dL (ref 0.3–1.2)
BUN: 19 mg/dL (ref 6–23)
CHLORIDE: 98 meq/L (ref 96–112)
CO2: 30 mEq/L (ref 19–32)
Calcium: 10 mg/dL (ref 8.4–10.5)
Creatinine, Ser: 1.45 mg/dL — ABNORMAL HIGH (ref 0.50–1.35)
GFR calc Af Amer: 56 mL/min — ABNORMAL LOW (ref >90)
GFR calc non Af Amer: 49 mL/min — ABNORMAL LOW (ref >90)
Glucose, Bld: 114 mg/dL — ABNORMAL HIGH (ref 70–99)
POTASSIUM: 4.4 meq/L (ref 3.7–5.3)
Sodium: 138 mEq/L (ref 137–147)
TOTAL PROTEIN: 7.2 g/dL (ref 6.0–8.3)

## 2013-06-21 LAB — TROPONIN I: Troponin I: <0.3 ng/mL (ref <0.30)

## 2013-06-21 LAB — LIPASE, BLOOD: LIPASE: 16 U/L (ref 11–59)

## 2013-06-21 MED ORDER — FAMOTIDINE IN NACL 20-0.9 MG/50ML-% IV SOLN
20.0000 mg | Freq: Once | INTRAVENOUS | Status: AC
  Administered 2013-06-21: 20 mg via INTRAVENOUS
  Filled 2013-06-21: qty 50

## 2013-06-21 MED ORDER — SODIUM CHLORIDE 0.9 % IV SOLN
INTRAVENOUS | Status: DC
  Administered 2013-06-21: 15:00:00 via INTRAVENOUS

## 2013-06-21 MED ORDER — IOHEXOL 300 MG/ML  SOLN
50.0000 mL | Freq: Once | INTRAMUSCULAR | Status: AC | PRN
Start: 2013-06-21 — End: 2013-06-21
  Administered 2013-06-21: 50 mL via ORAL

## 2013-06-21 NOTE — Discharge Instructions (Signed)
°Emergency Department Resource Guide °1) Find a Doctor and Pay Out of Pocket °Although you won't have to find out who is covered by your insurance plan, it is a good idea to ask around and get recommendations. You will then need to call the office and see if the doctor you have chosen will accept you as a new patient and what types of options they offer for patients who are self-pay. Some doctors offer discounts or will set up payment plans for their patients who do not have insurance, but you will need to ask so you aren't surprised when you get to your appointment. ° °2) Contact Your Local Health Department °Not all health departments have doctors that can see patients for sick visits, but many do, so it is worth a call to see if yours does. If you don't know where your local health department is, you can check in your phone book. The CDC also has a tool to help you locate your state's health department, and many state websites also have listings of all of their local health departments. ° °3) Find a Walk-in Clinic °If your illness is not likely to be very severe or complicated, you may want to try a walk in clinic. These are popping up all over the country in pharmacies, drugstores, and shopping centers. They're usually staffed by nurse practitioners or physician assistants that have been trained to treat common illnesses and complaints. They're usually fairly quick and inexpensive. However, if you have serious medical issues or chronic medical problems, these are probably not your best option. ° °No Primary Care Doctor: °- Call [facility] at  832-8000 - they can help you locate a primary care doctor that  accepts your insurance, provides certain services, etc. °- Physician Referral Service- 1-800-533-3463 ° °Chronic Pain Problems: °Organization         Address  Phone   Notes  °[facility]  (336) 297-2271 Patients need to be referred by their primary care doctor.  ° °Medication  Assistance: °Organization         Address  Phone   Notes  °[place] [facility] [address] °[place], [place] [address] (336) 641-8030 --Must be a resident of [place] °-- Must have NO insurance coverage whatsoever (no Medicaid/ Medicare, etc.) °-- The pt. MUST have a primary care doctor that directs their care regularly and follows them in the community °  °[facility]  (866) 331-1348   °[employer]  (888) 892-1162   ° °Agencies that provide inexpensive medical care: °Organization         Address  Phone   Notes  °[facility]  (336) 832-8035   °[facility]    (336) 832-7272   °[facility] [address] °[place], [place] [address] (336) 832-4777   °[facility] [address], °[place] (336) 271-4999   °[facility]    (336) 373-0678   °[facility]    (336) 272-1050   °[facility] ° [address], [place] Phone:  (336) 832-4444, Fax:  (336) 832-4440 Hours of Operation:  9 am - 6 pm, M-F.  Also accepts Medicaid/Medicare and self-pay.  °[facility] ° [address], [place] Phone: (336) 832-3150, Fax: (336) 832-3151. Hours of Operation:  8:30 am - 5:30 pm, M-F.  Also accepts Medicaid and self-pay.  °[facility] [place] [address], [place] Phone: (336) 878-6027   °[facility] [address], [place], [place] (336)723-1848, Ext. 123 Mondays & Thursdays: 7-9 AM.  First 15 patients are seen on a first come, first serve basis. °  ° °Medicaid-accepting [place] Providers: ° °Organization         Address  Phone   Notes  °[facility] [address], [place] (336) 641-2100 Also accepts self-pay patients.  °[facility] [address], [place] ° (336) 856-9996   °[facility] [address], [place]  (336) 288-8857   °[facility] [address] [place] [address], [place] (336) 299-7000   °Veita Bland [address], [place]  ° (336) 373-1557 Only accepts [place] Access Medicaid patients after they have their name applied to their card.  ° °Self-Pay (no insurance) in [place]: ° °Organization         Address  Phone   Notes  °Sickle Cell Patients, [facility] [address], [place] (336) 832-1970   °[facility] [address], [place] (336) 832-4400   °[facility] ° [address], [place] (336) 992-4800   °[facility]/Dr. Osei-Bonsu ° [address] [place] [address], [place] or [address], [place] (336) 841-8500 Phone number for both [place] and [place] locations is the same.  °[facility] [address], [place] (336) 299-0000   °[facility] [address] [place] [address], [place] or [address] (336) 852-7530 °(336) 878-2260   °[facility] [address], [place] (336) 350-1642, phone; (336) 294-5005, fax Sees patients 1st and 3rd Saturday of every month.  Must not qualify for public or private insurance (i.e. Medicaid, Medicare, [place] Health Choice, Veterans' Benefits) • Household income should be no more than 200% of the poverty level •The clinic cannot treat you if you are pregnant or think you are pregnant • Sexually transmitted diseases are not treated at the clinic.  ° ° °Dental Care: °Organization         Address  Phone  Notes  °[place] [facility] [address],  (336) 641-6152 Accepts children up to age 21 who are enrolled in Medicaid or [place] Health Choice; pregnant women with a Medicaid card; and children who have applied for Medicaid or [place] Health Choice, but were declined, whose parents can pay a reduced fee at time of service.  °[place]  [facility] [place]  [address], [place] (336) 641-7733 Accepts children up to age 21 who are enrolled in Medicaid or [place] Health Choice; pregnant women with a Medicaid card; and children who have applied for Medicaid or [place] Health Choice, but were declined, whose parents can pay a reduced fee at time of service.  °[facility] ° [address],  (336) 641-4533 Patients are seen by appointment only. Walk-ins are not accepted. [facility] will see patients 18 years of age and older. °Monday - Tuesday (8am-5pm) °Most Wednesdays (8:30-5pm) °$30 per visit, cash only  °[facility] ° [address], [place] (336) 641-4533 Patients are seen by appointment only. Walk-ins are not accepted. [facility] will see patients 18 years of age and older. °One   Wednesday Evening (Monthly: Volunteer Based).  $30 per visit, cash only  °[facility]  (919) 537-3737 for adults; Children under age 4, call Graduate Pediatric Dentistry at ([phone]) [phone]. Children aged 4-14, please call ([phone]) [phone] to request a pediatric application. ° Dental services are provided in all areas of dental care including fillings, crowns and bridges, complete and partial dentures, implants, gum treatment, root canals, and extractions. Preventive care is also provided. Treatment is provided to both adults and children. °Patients are selected via a lottery and there is often a waiting list. °  °[facility] [address], °[place] ° (336) 763-8833 [email or web address] °  °[facility] [address], [place], [place] (336)723-1848, Ext. 123 Second and Fourth Thursday of each month, opens at 6:30 AM; Clinic ends at 9 AM.  Patients are seen on a first-come first-served basis, and a limited number are seen during each clinic.  ° °[facility] ° [address], [place], [place] (336) 723-7904    Eligibility Requirements °You must have lived in [place], [place], or [place] counties for at least the last three months. °  You cannot be eligible for state or federal sponsored healthcare insurance, including Veterans Administration, Medicaid, or Medicare. °  You generally cannot be eligible for healthcare insurance through your employer.  °  How to apply: °Eligibility screenings are held every Tuesday and Wednesday afternoon from 1:00 pm until 4:00 pm. You do not need an appointment for the interview!  °[facility] [address], [place], [place] 336-631-2330   °[facility]  336-342-8273   °[facility]  336-703-3100   °[facility]  336-570-6415   ° °Behavioral Health Resources in the Community: °Intensive Outpatient Programs °Organization         Address  Phone  Notes  °[facility] [address], [place], [place] 336-878-6098   °[facility] [address], [place], [place] 336-832-9800   °[facility] [address], [place], [place] ° 336-882-2125   °[facility] [address],  °[place], [place] 1-800-853-5163 or 336-641-4981   °Substance Abuse Resources °Organization         Address  Phone  Notes  °[facility]  336-882-2125   °[facility]  336-784-9470   °[facility]  336-285-9073   °[facility]  336-845-3988   °Residential & Outpatient Substance Abuse Program  1-800-659-3381   °Psychological Services °Organization         Address  Phone  Notes  °[facility]  336- 832-9600   °[facility]  336- 378-7881   °[facility] [address], [place] 1-800-853-5163 or 336-641-4981   ° °Mobile Crisis Teams °Organization         Address  Phone  Notes  °[facility], Mobile Crisis Care Unit  1-877-626-1772   °Assertive °[facility] ° [address]  [place], [place] 336-834-9664   °Sharon DeEsch [address] °[place] 336-554-5454   ° °Self-Help/Support Groups °Organization         Address  Phone             Notes  °[facility]  - variety of support groups  336- 373-1402 Call for more information  °Narcotics Anonymous (NA), [facility] [address], °[place]   2 meetings at this location  ° °  Residential Treatment Programs °Organization         Address  Phone  Notes  °[facility] [address],    °[place]  1-866-801-8205   °[facility] ° [address], [place], [place] 704-293-8524   °[facility] [address], [place] 336-845-3988 Admissions: 8am-3pm M-F  °[facility] [address]    °[place], [place] 336-841-1104   °[facility] [address], [place], [place] 336-379-7146   °[facility] [address]  °[place], [place] 336-285-9073   °[facility] Intensive Outpatient [address], [place], [place] 336-852-3033   °[facility] [address]  °[place], [place] 1-877-615-2722 or 336-784-9470   °[facility] [address] [place], [place] 336-227-7417 Accepts Medicaid  °[facility] [address]  °[place] 1-800-659-3381 Substance Abuse/Addiction Treatment  ° °[place] Behavioral Health Resources °Organization         Address  Phone  Notes  °[facility]  (888) 581-9988   °Julie Brannon, PhD [address] [place], [place]   (336) 349-5553 or (336) 951-0000   °[facility]   [address] °[place], [place] (336) 349-4454   °[facility] [address], [place], [place] (336) 342-8316 Insurance/Medicaid/sponsorship through [facility]  °[facility] [address]                                    [place], [place] (336) 342-8316 Therapy/tele-psych/case    °[facility] [address]  ° [place], [place] (336) 349-2233    °Dr. Arfeen  (336) 349-4544   °[facility] [place]  [employer] [place] [facility] 1) [address], [place] °2) [address], [place] °3)  [address], [place] (336) 349-3220 °(336) 342-7768 ° °(336) 342-8140   °[place] [facility] (336) 342-1394 or (336) 342-3537 (After Hours)    ° ° ° °Take your usual prescriptions as previously directed.  Call your regular medical doctor tomorrow to schedule a follow up appointment within the next 2 days. Return to the Emergency Department immediately sooner if worsening.  ° °

## 2013-06-21 NOTE — ED Notes (Signed)
Pt drinking 2nd bottle of contrast.

## 2013-06-21 NOTE — ED Notes (Signed)
Pt provided water, crackers, and peanut butter for PO challenge. Will reassess

## 2013-06-21 NOTE — ED Notes (Signed)
Per patient started Thursday with multiple stools with mucus. Denied diarrhea. Patient now reports frequency in urination, "light headedness," weakness in both legs, and "dull" headache. Pt also reports generalized discomfort in abdomen as well as polyuria. Pt denies blood in stool or hematuria.

## 2013-06-21 NOTE — ED Notes (Signed)
Dr Mcmanus at bedside 

## 2013-06-21 NOTE — ED Provider Notes (Addendum)
CSN: [id number]     Arrival date & time 06/21/13  1403 History   First MD Initiated Contact with Patient 06/21/13 1426     Chief Complaint  Patient presents with  . Fatigue  . Abdominal Pain  . Diarrhea      HPI Pt was seen at 1440. Per pt, c/o gradual onset and persistence of constant generalized abd "pain" for the past 4 days. Describes the stools as "very soft" and "brown with mucus." States he saw a scant amount of blood on the toilet paper after he wiped after having a BM a few days ago.  Has been associated with generalized weakness/fatigue, lightheadedness, urinary frequency. States he was "constipated" last week. Further describes his symptoms as "it might be my IBS acting up." Denies N/V, no CP/SOB, no back pain, no fevers, no rash, no black or blood in stools, no rectal pain, no rectal discharge, no testicular pain/swelling, no dysuria/hematuria.     Past Medical History  Diagnosis Date  . Arteriosclerotic cardiovascular disease (ASCVD)     CABG in 08/1998  . GERD (gastroesophageal reflux disease)     s/p espohageal dilatatin and repair of hiatal hernia  . Hypertension   . Hyperlipidemia   . BPH (benign prostatic hypertrophy)     h/x of prostatis/ UTI in 2006  . Sigmoid diverticulosis   . PONV (postoperative nausea and vomiting)   . Chronic kidney disease, stage II (mild) 2010    Creatinine of 1.5 in 2010  . Coronary artery disease   . IBS (irritable bowel syndrome)    Past Surgical History  Procedure Laterality Date  . Cholecystectomy  1990s  . Hiatal hernia repair  1995  . Lumbar laminectomy  1999  . Hemorrhoid surgery  2007  . Coronary artery bypass graft  2000  . Colonoscopy  03/17/2012    Rourk; negative screening study   Family History  Problem Relation Age of Onset  . Colon cancer Neg Hx   . Coronary artery disease Father     also grandfather   History  Substance Use Topics  . Smoking status: Never Smoker   . Smokeless tobacco: Never Used      Comment: no use of tobacco products  . Alcohol Use: 1.0 oz/week    2 drink(s) per week     Comment: "Occasionally"    Review of Systems ROS: Statement: All systems negative except as marked or noted in the HPI; Constitutional: Negative for fever and chills. +generalized weakness/fatigue.; ; Eyes: Negative for eye pain, redness and discharge. ; ; ENMT: Negative for ear pain, hoarseness, nasal congestion, sinus pressure and sore throat. ; ; Cardiovascular: Negative for chest pain, palpitations, diaphoresis, dyspnea and peripheral edema. ; ; Respiratory: Negative for cough, wheezing and stridor. ; ; Gastrointestinal: Negative for nausea, vomiting, blood in stool, hematemesis, jaundice and +abd pain, diarrhea, rectal bleeding on toilet paper only.; ; Genitourinary: +urinary frequency. Negative for dysuria, flank pain and hematuria. ; ; Genital:  No penile drainage or rash, no testicular pain or swelling, no scrotal rash or swelling.;;  Musculoskeletal: Negative for back pain and neck pain. Negative for swelling and trauma.; ; Skin: Negative for pruritus, rash, abrasions, blisters, bruising and skin lesion.; ; Neuro: +lightheadedness. Negative for headache and neck stiffness. Negative for altered level of consciousness , altered mental status, extremity weakness, paresthesias, involuntary movement, seizure and syncope.      Allergies  Codeine; Demerol; and Nalbuphine  Home Medications   Current Outpatient Rx  Name  Route  Sig  Dispense  Refill  . amLODipine (NORVASC) 10 MG tablet   Oral   Take 5 mg by mouth every morning.          Marland Kitchen aspirin 81 MG EC tablet   Oral   Take 81 mg by mouth at bedtime.          . Coenzyme Q10 (CO Q 10) 100 MG CAPS   Oral   Take 100 mg by mouth every morning.          . finasteride (PROSCAR) 5 MG tablet   Oral   Take 5 mg by mouth every evening.         . hydrochlorothiazide (HYDRODIURIL) 25 MG tablet   Oral   Take 25 mg by mouth every morning.          Marland Kitchen omeprazole (PRILOSEC) 20 MG capsule   Oral   Take 20 mg by mouth 2 (two) times daily. Before LUNCH and Before SUPPER         . pravastatin (PRAVACHOL) 20 MG tablet   Oral   Take 20 mg by mouth every evening.         . PredniSONE 5 MG KIT   Oral   Take by mouth. As directed per 12 day course instructions.         . psyllium (METAMUCIL) 58.6 % powder   Oral   Take 1 packet by mouth daily.         . tamsulosin (FLOMAX) 0.4 MG CAPS capsule   Oral   Take 0.4 mg by mouth at bedtime.         . traMADol-acetaminophen (ULTRACET) 37.5-325 MG per tablet   Oral   Take 1 tablet by mouth every 8 (eight) hours as needed for moderate pain or severe pain (Muscle pain).         . sildenafil (VIAGRA) 50 MG tablet   Oral   Take 50 mg by mouth daily as needed.          BP 128/80  Pulse 59  Temp(Src) 98.1 F (36.7 C) (Oral)  Resp 18  Ht 5' 10"  (1.778 m)  Wt 182 lb (82.555 kg)  BMI 26.11 kg/m2  SpO2 99% Physical Exam 1445: Physical examination:  Nursing notes reviewed; Vital signs and O2 SAT reviewed;  Constitutional: Well developed, Well nourished, Well hydrated, In no acute distress; Head:  Normocephalic, atraumatic; Eyes: EOMI, PERRL, No scleral icterus; ENMT: Mouth and pharynx normal, Mucous membranes moist; Neck: Supple, Full range of motion, No lymphadenopathy; Cardiovascular: Regular rate and rhythm, No gallop; Respiratory: Breath sounds clear & equal bilaterally, No rales, rhonchi, wheezes.  Speaking full sentences with ease, Normal respiratory effort/excursion; Chest: Nontender, Movement normal; Abdomen: Soft, +mild diffuse tenderness to palp. No rebound or guarding. Nondistended, Normal bowel sounds. Rectal exam performed w/permission of pt and ED RN chaperone present.  Anal tone normal.  Non-tender, soft brown stool in rectal vault, heme neg.  No fissures, +external hemorrhoid without thrombosis or bleeding. No palp rectal masses.; Genitourinary: No CVA tenderness;  Extremities: Pulses normal, No tenderness, No edema, No calf edema or asymmetry.; Neuro: AA&Ox3, Major CN grossly intact.Speech clear.  No facial droop.  No nystagmus. Grips equal. Strength 5/5 equal bilat UE's and LE's.  DTR 2/4 equal bilat UE's and LE's.  No gross sensory deficits.  Normal cerebellar testing bilat UE's (finger-nose) and LE's (heel-shin)..; Skin: Color normal, Warm, Dry.; Psych:  Anxious.   ED Course  Procedures  EKG Interpretation   Date/Time:  Sunday June 21 2013 15:13:18 EDT Ventricular Rate:  63 PR Interval:  152 QRS Duration: 108 QT Interval:  430 QTC Calculation: 440 R Axis:   6 Text Interpretation:  Normal sinus rhythm Incomplete right bundle branch  block Abnormal ECG When compared with ECG of 21-Jun-2013 15:12, No  significant change was found Confirmed by [facility]  MD, Nunzio Cory [phone])  on 06/21/2013 4:11:35 PM      MDM  MDM Reviewed: previous chart, nursing note and vitals Reviewed previous: labs, ECG and CT scan Interpretation: labs, ECG, x-ray and CT scan    Results for orders placed during the hospital encounter of 06/21/13  URINALYSIS, ROUTINE W REFLEX MICROSCOPIC      Result Value Ref Range   Color, Urine YELLOW  YELLOW   APPearance CLEAR  CLEAR   Specific Gravity, Urine 1.010  1.005 - 1.030   pH 6.0  5.0 - 8.0   Glucose, UA NEGATIVE  NEGATIVE mg/dL   Hgb urine dipstick NEGATIVE  NEGATIVE   Bilirubin Urine NEGATIVE  NEGATIVE   Ketones, ur NEGATIVE  NEGATIVE mg/dL   Protein, ur NEGATIVE  NEGATIVE mg/dL   Urobilinogen, UA 0.2  0.0 - 1.0 mg/dL   Nitrite NEGATIVE  NEGATIVE   Leukocytes, UA NEGATIVE  NEGATIVE  CBC WITH DIFFERENTIAL      Result Value Ref Range   WBC 10.2  4.0 - 10.5 K/uL   RBC 4.61  4.22 - 5.81 MIL/uL   Hemoglobin 15.1  13.0 - 17.0 g/dL   HCT 44.3  39.0 - 52.0 %   MCV 96.1  78.0 - 100.0 fL   MCH 32.8  26.0 - 34.0 pg   MCHC 34.1  30.0 - 36.0 g/dL   RDW 13.6  11.5 - 15.5 %   Platelets 199  150 - 400 K/uL    Neutrophils Relative % 73  43 - 77 %   Neutro Abs 7.5  1.7 - 7.7 K/uL   Lymphocytes Relative 19  12 - 46 %   Lymphs Abs 1.9  0.7 - 4.0 K/uL   Monocytes Relative 8  3 - 12 %   Monocytes Absolute 0.8  0.1 - 1.0 K/uL   Eosinophils Relative 0  0 - 5 %   Eosinophils Absolute 0.0  0.0 - 0.7 K/uL   Basophils Relative 0  0 - 1 %   Basophils Absolute 0.0  0.0 - 0.1 K/uL  COMPREHENSIVE METABOLIC PANEL      Result Value Ref Range   Sodium 138  137 - 147 mEq/L   Potassium 4.4  3.7 - 5.3 mEq/L   Chloride 98  96 - 112 mEq/L   CO2 30  19 - 32 mEq/L   Glucose, Bld 114 (*) 70 - 99 mg/dL   BUN 19  6 - 23 mg/dL   Creatinine, Ser 1.45 (*) 0.50 - 1.35 mg/dL   Calcium 10.0  8.4 - 10.5 mg/dL   Total Protein 7.2  6.0 - 8.3 g/dL   Albumin 3.6  3.5 - 5.2 g/dL   AST 16  0 - 37 U/L   ALT 17  0 - 53 U/L   Alkaline Phosphatase 66  39 - 117 U/L   Total Bilirubin 0.9  0.3 - 1.2 mg/dL   GFR calc non Af Amer 49 (*) >90 mL/min   GFR calc Af Amer 56 (*) >90 mL/min  LIPASE, BLOOD      Result Value Ref Range   Lipase  16  11 - 59 U/L  TROPONIN I      Result Value Ref Range   Troponin I <0.30  <0.30 ng/mL  POC OCCULT BLOOD, ED      Result Value Ref Range   Fecal Occult Bld NEGATIVE  NEGATIVE   Ct Abdomen Pelvis Wo Contrast 06/21/2013   CLINICAL DATA:  Right-sided abdominal pain for 4 days, occurring after eating. History of a cholecystectomy and hernia repair. Renal insufficiency.  EXAM: CT ABDOMEN AND PELVIS WITHOUT CONTRAST  TECHNIQUE: Multidetector CT imaging of the abdomen and pelvis was performed following the standard protocol without intravenous contrast.  COMPARISON:  None.  FINDINGS: Liver and spleen are unremarkable. Gallbladder surgically absent. Is no bile duct dilation. Normal pancreas. No adrenal masses.  Stable posterior lower pole left renal cyst bilateral renal cortical thinning. No other renal masses. No stones. No hydronephrosis. Ureters are normal in course and in caliber. Bladder is unremarkable.  Prostate gland is enlarged bulging against the posterior inferior bladder base. It measures 5.7 cm x 5.2 cm in greatest transverse dimension fat planes between the prostate, seminal vesicles and bladder base are well preserved.  No pathologically enlarged lymph nodes. There are no abnormal fluid collections.  A bowel anastomosis staple line lies along the lower aspect of the rectum. There are multiple left colon diverticula. No diverticulitis. Colon is otherwise unremarkable. Normal small bowel. Normal appendix.  Minor reticular scarring is noted at the anterior lung bases. Heart is normal in size. There is a moderate hiatal hernia.  Degenerative changes are noted of the visualized spine. No osteoblastic or osteolytic lesions.  IMPRESSION: 1. No acute findings. 2. No findings to explain right-sided abdominal pain after eating. 3. Moderate hiatal hernia. 4. Status post cholecystectomy. Status post colon surgery with an anastomosis staple line along the lower rectum. 5. Left colon diverticulosis.  No diverticulitis. 6. Prostatic enlargement.   Electronically Signed   By: Lajean Manes M.D.   On: 06/21/2013 17:42   Dg Chest 2 View 06/21/2013   CLINICAL DATA:  Fatigue and dizziness.  EXAM: CHEST  2 VIEW  COMPARISON:  07/16/2012  FINDINGS: Sequelae of prior CABG are again identified with the numerous mediastinal surgical clips present. The cardiac silhouette is upper limits of normal in size, unchanged. The lungs are mildly hyperinflated without evidence of focal airspace consolidation, edema, pleural effusion, or pneumothorax. Small hiatal hernia is suspected. No acute osseous abnormality is seen.  IMPRESSION: No active cardiopulmonary disease.   Electronically Signed   By: Logan Bores   On: 06/21/2013 17:26   Results for JARRED, PURTEE (MRN [id number]) as of 06/21/2013 18:12  Ref. Range 07/17/2012 02:47 07/18/2012 05:37 06/21/2013 14:58  BUN Latest Range: 6-23 mg/dL 20 18 19   Creatinine Latest Range: 0.50-1.35 mg/dL  1.87 (H) 1.79 (H) 1.45 (H)    1800:  Pt has tol PO well while in the ED without N/V.  No stooling while in the ED.  Abd benign, VSS. Not orthostatic. Workup reassuring. Pt now appears less anxious, states he feels better and wants to go home now. Dx and testing d/w pt and family.  Questions answered.  Verb understanding, agreeable to d/c home with outpt f/u.     Alfonzo Feller, DO 06/22/13 1737

## 2013-06-22 LAB — URINE CULTURE
Colony Count: NO GROWTH
Culture: NO GROWTH

## 2013-06-24 ENCOUNTER — Telehealth: Payer: Self-pay

## 2013-06-24 MED ORDER — SUCRALFATE 1 GM/10ML PO SUSP: 1.0000 g | Freq: Four times a day (QID) | ORAL | Status: DC

## 2013-06-24 NOTE — Telephone Encounter (Signed)
Pt called - he has been sick recently and went to the ED on 06/21/13, CT and blood work was done, pt was given fluids and pepcid and he felt better and was sent home. He stated he started having pain in his stomach after eating and burning last night around 9pm and it lasted all night and he was vomiting "acid" this morning around 3am. He said this has happened before and he took a prilosec and it got better. He has taken 3 prilosec today and still feels bad. He said he has had a hiatal hernia repair 20 years ago and now his hiatal hernia has returned. He has also had his gallbladder removed 10-15 years ago by Dr. Tamala Julian. He is leaving Saturday going to [place] for 9 days and he is worried that this is going to bother him the whole time he is gone. He understands that he has been seen since 2013 and may need an ov but we may not be able to get him in before he leaves to go to [place]. Pt wants to know if there is anything you can call in for him to help with his symptoms until he gets back from [place]. Please advise.

## 2013-06-24 NOTE — Telephone Encounter (Signed)
Per RMR- pt needs ov, he can take prilosec bid, no ibuprofen, call in carafate 1gr qid. Needs to go to ED if it contiues or pt gets worse. Called and informed pt. He is seeing Dr. Hilma Favors tomorrow and he will come in when he gets back from [place]. rx sent to the pharmacy.  Manuela Schwartz, please schedule pt ov.

## 2013-06-25 DIAGNOSIS — K219 Gastro-esophageal reflux disease without esophagitis: Secondary | ICD-10-CM | POA: Diagnosis not present

## 2013-06-25 DIAGNOSIS — K440 Diaphragmatic hernia with obstruction, without gangrene: Secondary | ICD-10-CM | POA: Diagnosis not present

## 2013-06-25 DIAGNOSIS — Z6826 Body mass index (BMI) 26.0-26.9, adult: Secondary | ICD-10-CM | POA: Diagnosis not present

## 2013-06-29 ENCOUNTER — Encounter: Payer: Self-pay | Admitting: Gastroenterology

## 2013-06-29 NOTE — Telephone Encounter (Signed)
OV was made for 4/20 at 0930 with LSL and appt card was mailed

## 2013-07-28 DIAGNOSIS — N6009 Solitary cyst of unspecified breast: Secondary | ICD-10-CM | POA: Diagnosis not present

## 2013-08-03 ENCOUNTER — Ambulatory Visit (INDEPENDENT_AMBULATORY_CARE_PROVIDER_SITE_OTHER): Payer: Medicare Other | Admitting: Gastroenterology

## 2013-08-03 ENCOUNTER — Encounter: Payer: Self-pay | Admitting: Gastroenterology

## 2013-08-03 VITALS — BP 138/73 | HR 65 | Temp 97.6°F | Ht 70.0 in | Wt 182.6 lb

## 2013-08-03 DIAGNOSIS — K219 Gastro-esophageal reflux disease without esophagitis: Secondary | ICD-10-CM

## 2013-08-03 DIAGNOSIS — K589 Irritable bowel syndrome without diarrhea: Secondary | ICD-10-CM | POA: Insufficient documentation

## 2013-08-03 MED ORDER — DICYCLOMINE HCL 20 MG PO TABS: ORAL_TABLET | ORAL | Status: DC

## 2013-08-03 NOTE — Progress Notes (Signed)
Primary Care Physician: Purvis Kilts, MD  Primary Gastroenterologist:  Garfield Cornea, MD   Chief Complaint  Patient presents with  . Irritable Bowel Syndrome  . Gastrophageal Reflux    HPI: [name] is a 68 y.o. male here for further evaluation and management of recent GERD flare and IBS.  Last seen at time of screening colonoscopy in December 2013. He at colonic diverticulosis at that time. Next colonoscopy due in December 2023. Last EGD in May of 2009 when he presented with food impaction. He had a critical esophageal ring/stenosis status post dilation.  Back in 06/2013, acute onset of terrible heartburn. He called in for recommendations. He was advised to take Prilosec twice a day, Carafate 1 g 4 times a day. States he's feeling much better. He is mostly concerned about his IBS symptoms which have worsened recently.   One year, progressively worse with IBS-D. Used to have problems once per month. Now having problems more frequently. ER in 06/2013. Three to four loose stools with mucous postprandially. Did CT A/P with oral contrast only. He and moderate sized hiatal hernia, left-sided diverticulosis, prostatic enlargement. Saw Dr. Arnoldo Morale last week for skin issue, put on doxycyline BID for two weeks, no IBS since then. Usually starts out with abdominal cramps, bloating, has emptying episode. IBS for over twenty years. Rare brbpr.   No dysphagia, no vomiting. Pepto-bismol prn, but didn't really help. Takes probiotic sporadically.  Current Outpatient Prescriptions  Medication Sig Dispense Refill  . amLODipine (NORVASC) 10 MG tablet Take 5 mg by mouth every morning.       Marland Kitchen aspirin 81 MG EC tablet Take 81 mg by mouth at bedtime.       . Coenzyme Q10 (CO Q 10) 100 MG CAPS Take 100 mg by mouth every morning.       Marland Kitchen doxycycline (VIBRAMYCIN) 100 MG capsule Take 100 mg by mouth 2 (two) times daily.       . finasteride (PROSCAR) 5 MG tablet Take 5 mg by mouth every  evening.      . hydrochlorothiazide (HYDRODIURIL) 25 MG tablet Take 25 mg by mouth every morning.      Marland Kitchen omeprazole (PRILOSEC) 20 MG capsule Take 20 mg by mouth 2 (two) times daily. Before LUNCH and Before SUPPER      . pravastatin (PRAVACHOL) 20 MG tablet Take 20 mg by mouth every evening.      . Probiotic Product (PROBIOTIC DAILY PO) Take by mouth.      . psyllium (METAMUCIL) 58.6 % powder Take 1 packet by mouth daily.      . sildenafil (VIAGRA) 50 MG tablet Take 50 mg by mouth daily as needed.      . sucralfate (CARAFATE) 1 GM/10ML suspension Take 10 mLs (1 g total) by mouth 4 (four) times daily.  420 mL  0  . tamsulosin (FLOMAX) 0.4 MG CAPS capsule Take 0.4 mg by mouth at bedtime.      . traMADol-acetaminophen (ULTRACET) 37.5-325 MG per tablet Take 1 tablet by mouth every 8 (eight) hours as needed for moderate pain or severe pain (Muscle pain).       No current facility-administered medications for this visit.    Allergies as of 08/03/2013 - Review Complete 08/03/2013  Allergen Reaction Noted  . Codeine Nausea And Vomiting 04/05/2009  . Demerol [meperidine] Nausea And Vomiting 03/04/2012  . Nalbuphine Nausea And Vomiting 04/05/2009   Past Medical History  Diagnosis Date  .  Arteriosclerotic cardiovascular disease (ASCVD)     CABG in 08/1998  . GERD (gastroesophageal reflux disease)     s/p espohageal dilatatin and repair of hiatal hernia  . Hypertension   . Hyperlipidemia   . BPH (benign prostatic hypertrophy)     h/x of prostatis/ UTI in 2006  . Sigmoid diverticulosis   . PONV (postoperative nausea and vomiting)   . Chronic kidney disease, stage II (mild) 2010    Creatinine of 1.5 in 2010  . Coronary artery disease   . IBS (irritable bowel syndrome)    Past Surgical History  Procedure Laterality Date  . Cholecystectomy  1990s  . Hiatal hernia repair  1995  . Lumbar laminectomy  1999  . Hemorrhoid surgery  2007    total of three  . Coronary artery bypass graft  2000  .  Colonoscopy  03/17/2012    Rourk; colonic diverticulosis, repeat screening colonoscopy in 10 years.  . Esophagogastroduodenoscopy  May 2009    Rourk: Food impaction, critical esophageal ring/stricture requiring dilation    ROS:  General: Negative for anorexia, weight loss, fever, chills, fatigue, weakness. ENT: Negative for hoarseness, difficulty swallowing , nasal congestion. CV: Negative for chest pain, angina, palpitations, dyspnea on exertion, peripheral edema.  Respiratory: Negative for dyspnea at rest, dyspnea on exertion, cough, sputum, wheezing.  GI: See history of present illness. GU:  Negative for dysuria, hematuria, urinary incontinence, urinary frequency, nocturnal urination.  Endo: Negative for unusual weight change.    Physical Examination:   BP 138/73  Pulse 65  Temp(Src) 97.6 F (36.4 C) (Oral)  Ht 5\' 10"  (1.778 m)  Wt 182 lb 9.6 oz (82.827 kg)  BMI 26.20 kg/m2  General: Well-nourished, well-developed in no acute distress.  Eyes: No icterus. Mouth: Oropharyngeal mucosa moist and pink , no lesions erythema or exudate. Lungs: Clear to auscultation bilaterally.  Heart: Regular rate and rhythm, no murmurs rubs or gallops.  Abdomen: Bowel sounds are normal, nontender, nondistended, no hepatosplenomegaly or masses, no abdominal bruits or hernia , no rebound or guarding.   Extremities: No lower extremity edema. No clubbing or deformities. Neuro: Alert and oriented x 4   Skin: Warm and dry, no jaundice.   Psych: Alert and cooperative, normal mood and affect.  Labs:  Lab Results  Component Value Date   WBC 10.2 06/21/2013   HGB 15.1 06/21/2013   HCT 44.3 06/21/2013   MCV 96.1 06/21/2013   PLT 199 06/21/2013   Lab Results  Component Value Date   ALT 17 06/21/2013   AST 16 06/21/2013   ALKPHOS 66 06/21/2013   BILITOT 0.9 06/21/2013   Lab Results  Component Value Date   CREATININE 1.45* 06/21/2013   BUN 19 06/21/2013   NA 138 06/21/2013   K 4.4 06/21/2013   CL 98 06/21/2013   CO2  30 06/21/2013   Lab Results  Component Value Date   LIPASE 16 06/21/2013    Imaging Studies: No results found.

## 2013-08-03 NOTE — Patient Instructions (Signed)
1. Dicyclomine, half a tablet up to 3 times daily before meals for abdominal cramps and diarrhea (IBS).  2. If you have frequent episodes of irritable bowel, consider taking probiotic one daily for one month, rather than as needed only.  3. Call if you have any further issues with IBS or your acid reflux. 4. Office visit in 6 months with Dr. Gala Romney.   Irritable Bowel Syndrome Irritable Bowel Syndrome (IBS) is caused by a disturbance of normal bowel function. Other terms used are spastic colon, mucous colitis, and irritable colon. It does not require surgery, nor does it lead to cancer. There is no cure for IBS. But with proper diet, stress reduction, and medication, you will find that your problems (symptoms) will gradually disappear or improve. IBS is a common digestive disorder. It usually appears in late adolescence or early adulthood. Women develop it twice as often as men. CAUSES  After food has been digested and absorbed in the small intestine, waste material is moved into the colon (large intestine). In the colon, water and salts are absorbed from the undigested products coming from the small intestine. The remaining residue, or fecal material, is held for elimination. Under normal circumstances, gentle, rhythmic contractions on the bowel walls push the fecal material along the colon towards the rectum. In IBS, however, these contractions are irregular and poorly coordinated. The fecal material is either retained too long, resulting in constipation, or expelled too soon, producing diarrhea. SYMPTOMS  The most common symptom of IBS is pain. It is typically in the lower left side of the belly (abdomen). But it may occur anywhere in the abdomen. It can be felt as heartburn, backache, or even as a dull pain in the arms or shoulders. The pain comes from excessive bowel-muscle spasms and from the buildup of gas and fecal material in the colon. This pain:  Can range from sharp belly (abdominal) cramps to  a dull, continuous ache.  Usually worsens soon after eating.  Is typically relieved by having a bowel movement or passing gas. Abdominal pain is usually accompanied by constipation. But it may also produce diarrhea. The diarrhea typically occurs right after a meal or upon arising in the morning. The stools are typically soft and watery. They are often flecked with secretions (mucus). Other symptoms of IBS include:  Bloating.  Loss of appetite.  Heartburn.  Feeling sick to your stomach (nausea).  Belching  Vomiting  Gas. IBS may also cause a number of symptoms that are unrelated to the digestive system:  Fatigue.  Headaches.  Anxiety  Shortness of breath  Difficulty in concentrating.  Dizziness. These symptoms tend to come and go. DIAGNOSIS  The symptoms of IBS closely mimic the symptoms of other, more serious digestive disorders. So your caregiver may wish to perform a variety of additional tests to exclude these disorders. He/she wants to be certain of learning what is wrong (diagnosis). The nature and purpose of each test will be explained to you. TREATMENT A number of medications are available to help correct bowel function and/or relieve bowel spasms and abdominal pain. Among the drugs available are:  Mild, non-irritating laxatives for severe constipation and to help restore normal bowel habits.  Specific anti-diarrheal medications to treat severe or prolonged diarrhea.  Anti-spasmodic agents to relieve intestinal cramps.  Your caregiver may also decide to treat you with a mild tranquilizer or sedative during unusually stressful periods in your life. The important thing to remember is that if any drug is prescribed  for you, make sure that you take it exactly as directed. Make sure that your caregiver knows how well it worked for you. HOME CARE INSTRUCTIONS   Avoid foods that are high in fat or oils. Some examples NFA:OZHYQ cream, butter, frankfurters, sausage,  and other fatty meats.  Avoid foods that have a laxative effect, such as fruit, fruit juice, and dairy products.  Cut out carbonated drinks, chewing gum, and "gassy" foods, such as beans and cabbage. This may help relieve bloating and belching.  Bran taken with plenty of liquids may help relieve constipation.  Keep track of what foods seem to trigger your symptoms.  Avoid emotionally charged situations or circumstances that produce anxiety.  Start or continue exercising.  Get plenty of rest and sleep. MAKE SURE YOU:   Understand these instructions.  Will watch your condition.  Will get help right away if you are not doing well or get worse. Document Released: 04/02/2005 Document Revised: 06/25/2011 Document Reviewed: 11/21/2007 [facility] Patient Information 2014 [place].

## 2013-08-04 ENCOUNTER — Encounter: Payer: Self-pay | Admitting: Gastroenterology

## 2013-08-04 NOTE — Assessment & Plan Note (Addendum)
Complains of worsening IBS-D. Symptoms are more frequent than before. Has been on doxycycline for skin issues but notes that his diarrhea has resolved. Colonoscopy 2013 somewhat reassuring. Would recommend trial of dicyclomine if symptoms recur. Prescription provided for 20 mg tablet as this was covered by his insurance but he was instructed to take a half a tablet up to 3 times daily with meals as needed. Office visit in 6 months or sooner if needed. He was also instructed to take probiotic daily for 4 weeks.

## 2013-08-04 NOTE — Assessment & Plan Note (Addendum)
Recent flare of gastroesophageal reflux disease. Symptoms now under control. Would recommend continuing Prilosec twice a day. He has completed Carafate. Anti-reflex measures discussed. Office visit in 6 months.

## 2013-08-04 NOTE — Progress Notes (Signed)
cc'd to pcp 

## 2013-08-11 DIAGNOSIS — N6009 Solitary cyst of unspecified breast: Secondary | ICD-10-CM | POA: Diagnosis not present

## 2013-08-11 NOTE — Patient Instructions (Signed)
Your procedure is scheduled on: 08/17/2013  Report to [facility] at 6:15    AM.  Call this number if you have problems the morning of surgery: [phone]   Remember:   Do not drink or eat food:After Midnight.  :  Take these medicines the morning of surgery with A SIP OF WATER: Amlodipine and Omeprazole   Do not wear jewelry, make-up or nail polish.  Do not wear lotions, powders, or perfumes. You may wear deodorant.  Do not shave 48 hours prior to surgery. Men may shave face and neck.  Do not bring valuables to the hospital.  Contacts, dentures or bridgework may not be worn into surgery.  Leave suitcase in the car. After surgery it may be brought to your room.  For patients admitted to the hospital, checkout time is 11:00 AM the day of discharge.   Patients discharged the day of surgery will not be allowed to drive home.    Special Instructions: Shower using CHG night before surgery and shower the day of surgery use CHG.  Use special wash - you have one bottle of CHG for all showers.  You should use approximately 1/2 of the bottle for each shower.   Please read over the following fact sheets that you were given: Pain Booklet, MRSA Information, Surgical Site Infection Prevention and Care and Recovery After Surgery  Breast Biopsy A breast biopsy is a procedure where a sample of breast tissue is removed from your breast. The tissue is examined under a microscope to see if cancerous cells are present. A breast biopsy is done when there is:  Any undiagnosed breast mass (tumor).  Nipple abnormalities, dimpling, crusting, or ulcerations.  Abnormal discharge from the nipple, especially blood.  Redness, swelling, and pain of the breast.  Calcium deposits (calcifications) or abnormalities seen on a mammogram, ultrasound result, or results of magnetic resonance imaging (MRI).  Suspicious changes in the breast seen on your mammogram. If the tumor is found to be cancerous (malignant), a breast  biopsy can help to determine what the best treatment is for you. There are many different types of breast biopsies. Talk to your caregiver about your options and which type is best for you. LET YOUR CAREGIVER KNOW ABOUT:  Allergies to food or medicine.  Medicines taken, including vitamins, herbs, eyedrops, over-the-counter medicines, and creams.  Use of steroids (by mouth or creams).  Previous problems with anesthetics or numbing medicines.  History of bleeding problems or blood clots.  Previous surgery.  Other health problems, including diabetes and kidney problems.  Any recent colds or infections.  Possibility of pregnancy, if this applies. RISKS AND COMPLICATIONS   Bleeding.  Infection.  Allergy to medicines.  Bruising and swelling of the breast.  Alteration in the shape of the breast.  Not finding the lump or abnormality.  Needing more surgery. BEFORE THE PROCEDURE  Arrange for someone to drive you home after the procedure.  Do not smoke for 2 weeks before the procedure. Stop smoking, if you smoke.  Do not drink alcohol for 24 hours before procedure.  Wear a good support bra to the procedure. PROCEDURE  You may be given a medicine to numb the breast area (local anesthesia) or a medicine to make you sleep (general anesthesia) during the procedure. The following are the different types of biopsies that can be performed.   Fine-needle aspiration A thin needle is attached to a syringe and inserted into the breast lump. Fluid and cells are removed  and then looked at under a microscope. If the breast lump cannot be felt, an ultrasound may be used to help locate the lump and place the needle in the correct area.   Core needle biopsy A wide, hollow needle (core needle) is inserted into the breast lump 3 6 times to get tissue samples or cores. The samples are removed. The needle is usually placed in the correct area by using an ultrasound or X-ray.   Stereotactic  biopsy X-ray equipment and a computer are used to analyze X-ray pictures of the breast lump. The computer then finds exactly where the core needle needs to be inserted. Tissue samples are removed.   Vacuum-assisted biopsy A small incision (less than  inch) is made in your breast. A biopsy device that includes a hollow needle and vacuum is passed through the incision and into the breast tissue. The vacuum gently draws abnormal breast tissue into the needle to remove it. This type of biopsy removes a larger tissue sample than a regular core needle biopsy. No stitches are needed, and there is usually little scarring.  Ultrasound-guided core needle biopsy A high frequency ultrasound helps guide the core needle to the area of the mass or abnormality. An incision is made to insert the needle. Tissue samples are removed.  Open biopsy A larger incision is made in the breast. Your caregiver will attempt to remove the whole breast lump or as much as possible. AFTER THE PROCEDURE  You will be taken to the recovery area. If you are doing well and have no problems, you will be allowed to go home.  You may notice bruising on your breast. This is normal.  Your caregiver may apply a pressure dressing on your breast for 24 48 hours. A pressure dressing is a bandage that is wrapped tightly around the chest to stop fluid from collecting underneath tissues. Document Released: 04/02/2005 Document Revised: 07/28/2012 Document Reviewed: 05/03/2011 [facility] Patient Information 2014 [place].

## 2013-08-11 NOTE — H&P (Signed)
  NTS SOAP Note  Vital Signs:  Vitals as of: 2/56/3893: Systolic 734: Diastolic 90: Heart Rate 68: Temp 98.2F: Height 71ft 10in: Weight 181Lbs 0 Ounces: Pain Level 8: BMI 25.97  BMI : 25.97 kg/m2  Subjective: This 68 Years 77 Months old Male presents for of a tender knot in the right nipple.  Has a h/o gynecomastia.  Developed a tender nodule in his right nipple one month ago.  Seems to be getting larger and is sore to touch.  Had mammograms 6 months ago which were negative.  Review of Symptoms:  Constitutional:unremarkable   Head:unremarkable    Eyes:unremarkable   sinus problems Cardiovascular:  unremarkable   Respiratory:unremarkable   Gastrointestin    abdominal pain,diarrhea,heartburn Genitourinary:unremarkable       joint and back pain Skin:unremarkable Breast:  negativeas above   hay fever   Past Medical History:    Reviewed  Past Medical History  Surgical History: abdominal herniorrhaphy, hiatal hernia surgery, back surgery, CABG, lipoma removal Medical Problems: gynecomastia, high cholesterol, HTN, reflux Allergies: nubain, codeine, demerol Medications: pravastatin, tamulosin, amlodipine, HCTZ, finasteride, prilosec, baby asa   Social History:Reviewed  Social History  Preferred Language: English Race:  White Ethnicity: Not Hispanic / Latino Age: 68 Years 9 Months Marital Status:  M Alcohol: no Recreational drug(s): no    Smoking Status: Never smoker reviewed on 07/28/2013 Functional Status reviewed on 07/28/2013 ------------------------------------------------ Bathing: Normal Cooking: Normal Dressing: Normal Driving: Normal Eating: Normal Managing Meds: Normal Oral Care: Normal Shopping: Normal Toileting: Normal Transferring: Normal Walking: Normal Cognitive Status reviewed on 07/28/2013 ------------------------------------------------ Attention: Normal Decision Making: Normal Language:  Normal Memory: Normal Motor: Normal Perception: Normal Problem Solving: Normal Visual and Spatial: Normal   Family History:  Reviewed  Family Health History Mother, Deceased; Healthy;  Father, Deceased; Healthy;     Objective Information: General:  Well appearing, well nourished in no distress. Heart:  RRR, no murmur Lungs:    CTA bilaterally, no wheezes, rhonchi, rales.  Breathing unlabored.       Small fluctulant  area along superior aspect of the right areola.  Needle aspiration revealed minimal clear fluid.  No dominant mass, nipple discharge, dimpling in either breast.  Assessment:Right nipple cyst  Diagnoses: 610.0 Cyst of breast (Solitary cyst of left breast)  Procedures: 28768 - OFFICE OUTPATIENT NEW 20 MINUTES    Plan:  Scheduled for right breast biopsy on 08/17/13.  Risks and benefits of procedure including bleeding and infection were fully explained to the patient, who gives informed consent.

## 2013-08-12 ENCOUNTER — Encounter (HOSPITAL_COMMUNITY): Payer: Self-pay

## 2013-08-12 ENCOUNTER — Encounter (HOSPITAL_COMMUNITY)
Admission: RE | Admit: 2013-08-12 | Discharge: 2013-08-12 | Disposition: A | Discharge status: Home / Self Care | Payer: Medicare Other | Source: Ambulatory Visit | Attending: General Surgery | Admitting: General Surgery

## 2013-08-12 DIAGNOSIS — Z01812 Encounter for preprocedural laboratory examination: Secondary | ICD-10-CM | POA: Diagnosis not present

## 2013-08-12 LAB — CBC WITH DIFFERENTIAL/PLATELET
BASOS PCT: 1 % (ref 0–1)
Basophils Absolute: 0 10*3/uL (ref 0.0–0.1)
EOS ABS: 0.1 10*3/uL (ref 0.0–0.7)
Eosinophils Relative: 1 % (ref 0–5)
HCT: 42.7 % (ref 39.0–52.0)
Hemoglobin: 14.7 g/dL (ref 13.0–17.0)
Lymphocytes Relative: 32 % (ref 12–46)
Lymphs Abs: 2.3 10*3/uL (ref 0.7–4.0)
MCH: 32.7 pg (ref 26.0–34.0)
MCHC: 34.4 g/dL (ref 30.0–36.0)
MCV: 95.1 fL (ref 78.0–100.0)
Monocytes Absolute: 0.7 10*3/uL (ref 0.1–1.0)
Monocytes Relative: 9 % (ref 3–12)
NEUTROS PCT: 57 % (ref 43–77)
Neutro Abs: 4 10*3/uL (ref 1.7–7.7)
Platelets: 223 10*3/uL (ref 150–400)
RBC: 4.49 MIL/uL (ref 4.22–5.81)
RDW: 13.4 % (ref 11.5–15.5)
WBC: 7.1 10*3/uL (ref 4.0–10.5)

## 2013-08-12 LAB — BASIC METABOLIC PANEL
BUN: 17 mg/dL (ref 6–23)
CALCIUM: 10.4 mg/dL (ref 8.4–10.5)
CO2: 30 mEq/L (ref 19–32)
Chloride: 99 mEq/L (ref 96–112)
Creatinine, Ser: 1.6 mg/dL — ABNORMAL HIGH (ref 0.50–1.35)
GFR calc Af Amer: 50 mL/min — ABNORMAL LOW (ref >90)
GFR, EST NON AFRICAN AMERICAN: 43 mL/min — AB (ref >90)
Glucose, Bld: 120 mg/dL — ABNORMAL HIGH (ref 70–99)
Potassium: 4.3 mEq/L (ref 3.7–5.3)
SODIUM: 141 meq/L (ref 137–147)

## 2013-08-17 ENCOUNTER — Encounter (HOSPITAL_COMMUNITY): Payer: Medicare Other | Admitting: Anesthesiology

## 2013-08-17 ENCOUNTER — Ambulatory Visit (HOSPITAL_COMMUNITY): Payer: Medicare Other | Admitting: Anesthesiology

## 2013-08-17 ENCOUNTER — Encounter (HOSPITAL_COMMUNITY): Payer: Self-pay | Admitting: *Deleted

## 2013-08-17 ENCOUNTER — Encounter (HOSPITAL_COMMUNITY)
Admission: RE | Disposition: A | Discharge status: Home / Self Care | Payer: Self-pay | Source: Ambulatory Visit | Attending: General Surgery

## 2013-08-17 ENCOUNTER — Ambulatory Visit (HOSPITAL_COMMUNITY)
Admission: RE | Admit: 2013-08-17 | Discharge: 2013-08-17 | Disposition: A | Discharge status: Home / Self Care | Payer: Medicare Other | Source: Ambulatory Visit | Attending: General Surgery | Admitting: General Surgery

## 2013-08-17 DIAGNOSIS — Z951 Presence of aortocoronary bypass graft: Secondary | ICD-10-CM | POA: Diagnosis not present

## 2013-08-17 DIAGNOSIS — C44519 Basal cell carcinoma of skin of other part of trunk: Secondary | ICD-10-CM | POA: Diagnosis not present

## 2013-08-17 DIAGNOSIS — E7800 Pure hypercholesterolemia, unspecified: Secondary | ICD-10-CM | POA: Insufficient documentation

## 2013-08-17 DIAGNOSIS — N62 Hypertrophy of breast: Secondary | ICD-10-CM | POA: Diagnosis not present

## 2013-08-17 DIAGNOSIS — I2510 Atherosclerotic heart disease of native coronary artery without angina pectoris: Secondary | ICD-10-CM | POA: Diagnosis not present

## 2013-08-17 DIAGNOSIS — N63 Unspecified lump in unspecified breast: Secondary | ICD-10-CM | POA: Diagnosis not present

## 2013-08-17 DIAGNOSIS — D493 Neoplasm of unspecified behavior of breast: Secondary | ICD-10-CM | POA: Diagnosis not present

## 2013-08-17 DIAGNOSIS — I10 Essential (primary) hypertension: Secondary | ICD-10-CM | POA: Diagnosis not present

## 2013-08-17 DIAGNOSIS — K219 Gastro-esophageal reflux disease without esophagitis: Secondary | ICD-10-CM | POA: Insufficient documentation

## 2013-08-17 HISTORY — PX: BREAST BIOPSY: SHX20

## 2013-08-17 SURGERY — BREAST BIOPSY
Anesthesia: General | Site: Breast | Laterality: Right

## 2013-08-17 MED ORDER — ONDANSETRON HCL 4 MG/2ML IJ SOLN: 4.0000 mg | Freq: Once | INTRAMUSCULAR | Status: DC | PRN

## 2013-08-17 MED ORDER — SCOPOLAMINE 1 MG/3DAYS TD PT72
1.0000 | MEDICATED_PATCH | Freq: Once | TRANSDERMAL | Status: DC
  Administered 2013-08-17: 1.5 mg via TRANSDERMAL

## 2013-08-17 MED ORDER — FENTANYL CITRATE 0.05 MG/ML IJ SOLN
INTRAMUSCULAR | Status: AC
  Filled 2013-08-17: qty 2

## 2013-08-17 MED ORDER — 0.9 % SODIUM CHLORIDE (POUR BTL) OPTIME
TOPICAL | Status: DC | PRN
  Administered 2013-08-17: 1000 mL

## 2013-08-17 MED ORDER — FENTANYL CITRATE 0.05 MG/ML IJ SOLN: 25.0000 ug | INTRAMUSCULAR | Status: DC | PRN

## 2013-08-17 MED ORDER — BUPIVACAINE HCL (PF) 0.5 % IJ SOLN
INTRAMUSCULAR | Status: DC | PRN
  Administered 2013-08-17: 9 mL

## 2013-08-17 MED ORDER — DEXAMETHASONE SODIUM PHOSPHATE 4 MG/ML IJ SOLN
INTRAMUSCULAR | Status: AC
  Filled 2013-08-17: qty 1

## 2013-08-17 MED ORDER — ONDANSETRON HCL 4 MG/2ML IJ SOLN
4.0000 mg | Freq: Once | INTRAMUSCULAR | Status: AC
  Administered 2013-08-17: 4 mg via INTRAVENOUS

## 2013-08-17 MED ORDER — GLYCOPYRROLATE 0.2 MG/ML IJ SOLN
0.2000 mg | Freq: Once | INTRAMUSCULAR | Status: AC
  Administered 2013-08-17: 0.2 mg via INTRAVENOUS

## 2013-08-17 MED ORDER — PROPOFOL 10 MG/ML IV BOLUS
INTRAVENOUS | Status: DC | PRN
  Administered 2013-08-17: 150 mg via INTRAVENOUS

## 2013-08-17 MED ORDER — TRAMADOL-ACETAMINOPHEN 37.5-325 MG PO TABS: 1.0000 | ORAL_TABLET | Freq: Four times a day (QID) | ORAL | Status: DC | PRN

## 2013-08-17 MED ORDER — MIDAZOLAM HCL 2 MG/2ML IJ SOLN
1.0000 mg | INTRAMUSCULAR | Status: DC | PRN
  Administered 2013-08-17: 2 mg via INTRAVENOUS

## 2013-08-17 MED ORDER — ONDANSETRON HCL 4 MG/2ML IJ SOLN
INTRAMUSCULAR | Status: AC
  Filled 2013-08-17: qty 2

## 2013-08-17 MED ORDER — LIDOCAINE HCL 1 % IJ SOLN
INTRAMUSCULAR | Status: DC | PRN
  Administered 2013-08-17: 30 mg via INTRADERMAL

## 2013-08-17 MED ORDER — MIDAZOLAM HCL 2 MG/2ML IJ SOLN
INTRAMUSCULAR | Status: AC
  Filled 2013-08-17: qty 2

## 2013-08-17 MED ORDER — KETOROLAC TROMETHAMINE 30 MG/ML IJ SOLN
30.0000 mg | Freq: Once | INTRAMUSCULAR | Status: AC
  Administered 2013-08-17: 30 mg via INTRAVENOUS

## 2013-08-17 MED ORDER — SCOPOLAMINE 1 MG/3DAYS TD PT72
MEDICATED_PATCH | TRANSDERMAL | Status: AC
  Filled 2013-08-17: qty 1

## 2013-08-17 MED ORDER — FENTANYL CITRATE 0.05 MG/ML IJ SOLN
INTRAMUSCULAR | Status: DC | PRN
  Administered 2013-08-17: 50 ug via INTRAVENOUS
  Administered 2013-08-17 (×2): 25 ug via INTRAVENOUS

## 2013-08-17 MED ORDER — CHLORHEXIDINE GLUCONATE 4 % EX LIQD: 1.0000 "application " | Freq: Once | CUTANEOUS | Status: DC

## 2013-08-17 MED ORDER — KETOROLAC TROMETHAMINE 30 MG/ML IJ SOLN
INTRAMUSCULAR | Status: AC
  Filled 2013-08-17: qty 1

## 2013-08-17 MED ORDER — LACTATED RINGERS IV SOLN
INTRAVENOUS | Status: DC
  Administered 2013-08-17: 07:00:00 via INTRAVENOUS

## 2013-08-17 MED ORDER — PROPOFOL 10 MG/ML IV BOLUS
INTRAVENOUS | Status: AC
  Filled 2013-08-17: qty 20

## 2013-08-17 MED ORDER — TRAMADOL-ACETAMINOPHEN 37.5-325 MG PO TABS: 1.0000 | ORAL_TABLET | Freq: Three times a day (TID) | ORAL | Status: DC | PRN

## 2013-08-17 MED ORDER — DEXAMETHASONE SODIUM PHOSPHATE 4 MG/ML IJ SOLN
4.0000 mg | Freq: Once | INTRAMUSCULAR | Status: AC
  Administered 2013-08-17: 4 mg via INTRAVENOUS

## 2013-08-17 MED ORDER — BUPIVACAINE HCL (PF) 0.5 % IJ SOLN
INTRAMUSCULAR | Status: AC
  Filled 2013-08-17: qty 30

## 2013-08-17 MED ORDER — FENTANYL CITRATE 0.05 MG/ML IJ SOLN
25.0000 ug | INTRAMUSCULAR | Status: AC
  Administered 2013-08-17 (×2): 25 ug via INTRAVENOUS

## 2013-08-17 MED ORDER — GLYCOPYRROLATE 0.2 MG/ML IJ SOLN
INTRAMUSCULAR | Status: AC
  Filled 2013-08-17: qty 1

## 2013-08-17 MED ORDER — FENTANYL CITRATE 0.05 MG/ML IJ SOLN
INTRAMUSCULAR | Status: AC
  Filled 2013-08-17: qty 5

## 2013-08-17 SURGICAL SUPPLY — 26 items
BAG HAMPER (MISCELLANEOUS) ×2 IMPLANT
CLOTH BEACON ORANGE TIMEOUT ST (SAFETY) ×2 IMPLANT
COVER LIGHT HANDLE STERIS (MISCELLANEOUS) ×4 IMPLANT
DERMABOND ADVANCED (GAUZE/BANDAGES/DRESSINGS) ×1
DERMABOND ADVANCED .7 DNX12 (GAUZE/BANDAGES/DRESSINGS) ×1 IMPLANT
DURAPREP 26ML APPLICATOR (WOUND CARE) ×2 IMPLANT
ELECT REM PT RETURN 9FT ADLT (ELECTROSURGICAL) ×2
ELECTRODE REM PT RTRN 9FT ADLT (ELECTROSURGICAL) ×1 IMPLANT
FORMALIN 10 PREFIL 120ML (MISCELLANEOUS) ×2 IMPLANT
GLOVE BIO SURGEON STRL SZ7.5 (GLOVE) ×2 IMPLANT
GLOVE BIOGEL PI IND STRL 7.0 (GLOVE) ×1 IMPLANT
GLOVE BIOGEL PI INDICATOR 7.0 (GLOVE) ×1
GLOVE ECLIPSE 6.5 STRL STRAW (GLOVE) ×2 IMPLANT
GLOVE EXAM NITRILE MD LF STRL (GLOVE) ×2 IMPLANT
GOWN STRL REUS W/TWL LRG LVL3 (GOWN DISPOSABLE) ×4 IMPLANT
KIT ROOM TURNOVER APOR (KITS) ×2 IMPLANT
MANIFOLD NEPTUNE II (INSTRUMENTS) ×2 IMPLANT
NS IRRIG 1000ML POUR BTL (IV SOLUTION) ×2 IMPLANT
PACK MINOR (CUSTOM PROCEDURE TRAY) ×2 IMPLANT
PAD ARMBOARD 7.5X6 YLW CONV (MISCELLANEOUS) ×2 IMPLANT
SET BASIN LINEN APH (SET/KITS/TRAYS/PACK) ×2 IMPLANT
SPONGE GAUZE 2X2 8PLY STRL LF (GAUZE/BANDAGES/DRESSINGS) IMPLANT
STRIP CLOSURE SKIN 1/4X3 (GAUZE/BANDAGES/DRESSINGS) IMPLANT
SUT VIC AB 3-0 SH 27 (SUTURE) ×1
SUT VIC AB 3-0 SH 27X BRD (SUTURE) ×1 IMPLANT
SUT VIC AB 4-0 PS2 27 (SUTURE) ×2 IMPLANT

## 2013-08-17 NOTE — Anesthesia Postprocedure Evaluation (Signed)
  Anesthesia Post-op Note  Patient: [name]  Procedure(s) Performed: Procedure(s): RIGHT BREAST BIOPSY (Right)  Patient Location: PACU  Anesthesia Type:General  Level of Consciousness: awake and alert   Airway and Oxygen Therapy: Patient Spontanous Breathing and Patient connected to face mask oxygen  Post-op Pain: none  Post-op Assessment: Post-op Vital signs reviewed, Patient's Cardiovascular Status Stable, Respiratory Function Stable, Patent Airway and No signs of Nausea or vomiting  Post-op Vital Signs: Reviewed and stable  Last Vitals:  Filed Vitals:   08/17/13 0725  BP: 113/73  Pulse:   Temp:   Resp: 20    Complications: No apparent anesthesia complications

## 2013-08-17 NOTE — Transfer of Care (Signed)
Immediate Anesthesia Transfer of Care Note  Patient: [name]  Procedure(s) Performed: Procedure(s): RIGHT BREAST BIOPSY (Right)  Patient Location: PACU  Anesthesia Type:General  Level of Consciousness: awake  Airway & Oxygen Therapy: Patient Spontanous Breathing and Patient connected to face mask oxygen  Post-op Assessment: Report given to PACU RN  Post vital signs: Reviewed and stable  Complications: No apparent anesthesia complications

## 2013-08-17 NOTE — Anesthesia Procedure Notes (Signed)
Procedure Name: LMA Insertion Date/Time: 08/17/2013 7:38 AM Performed by: Tressie Stalker E Pre-anesthesia Checklist: Patient identified, Patient being monitored, Emergency Drugs available, Timeout performed and Suction available Patient Re-evaluated:Patient Re-evaluated prior to inductionOxygen Delivery Method: Circle System Utilized Preoxygenation: Pre-oxygenation with 100% oxygen Intubation Type: IV induction Ventilation: Mask ventilation without difficulty LMA: LMA inserted LMA Size: 4.0 Number of attempts: 1 Placement Confirmation: positive ETCO2 and breath sounds checked- equal and bilateral

## 2013-08-17 NOTE — Interval H&P Note (Signed)
History and Physical Interval Note:  08/17/2013 7:21 AM  [name]  has presented today for surgery, with the diagnosis of right breast neoplasm  The various methods of treatment have been discussed with the patient and family. After consideration of risks, benefits and other options for treatment, the patient has consented to  Procedure(s): RIGHT BREAST BIOPSY (Right) as a surgical intervention .  The patient's history has been reviewed, patient examined, no change in status, stable for surgery.  I have reviewed the patient's chart and labs.  Questions were answered to the patient's satisfaction.     [name]

## 2013-08-17 NOTE — Op Note (Signed)
Patient:  [name]  DOB:  08-14-1945  MRN:  [id number]   Preop Diagnosis:  Right breast/nipple neoplasm, unspecified  Postop Diagnosis:  Same  Procedure:   Right breast biopsy  Surgeon:  Aviva Signs, M.D.  Anes:  General  Indications:  Patient is a 68 year old white male who presents with a tender area posterior to the right nipple as well as a skin abnormality along the superior aspect of the areola. The risks and benefits of the procedure were fully explained to the patient, who gave informed consent.  Procedure note:  The patient is placed the supine position. After general anesthesia was administered, the right breast was prepped and draped using usual sterile technique with DuraPrep. Surgical site confirmation was performed.  An ellipse of skin was taken along the superior aspect of the right areola. In addition, breast tissue just posterior to the areola was excised. Both were sent to pathology further examination. Any bleeding was controlled using Bovie electrocautery. The skin was reapproximated using a 4-0 Vicryl subcuticular suture. 0.5% Sensorcaine was instilled the surrounding wound. Dermabond was then applied.  All tape and needle counts were correct at the end of the procedure. Patient was awakened and transferred to PACU in stable condition.  Complications:  None  EBL:  Minimal  Specimen:  Right breast tissue and areola

## 2013-08-17 NOTE — Discharge Instructions (Signed)
Breast Biopsy  °Care After °Refer to this sheet in the next few weeks. These instructions provide you with information on caring for yourself after your procedure. Your caregiver may also give you more specific instructions. Your treatment has been planned according to current medical practices, but problems sometimes occur. Call your caregiver if you have any problems or questions after your procedure. °HOME CARE INSTRUCTIONS  °· Only take over-the-counter or prescription medicines for pain, discomfort, or fever as directed by your caregiver. °· Do not take aspirin. It can cause bleeding. °· Keep stitches dry when bathing. °· Protect the biopsy area. Do not let the area get bumped. °· Avoid activities that may pull the incision site open until approved by your caregiver. This can include stretching, reaching, exercise, sports, or lifting over 3 pounds. °· Resume your usual diet. °· Wear a good support bra for as long as directed by your caregiver. °· Change any bandages (dressings) as directed by your caregiver. °· Do not drink alcohol while taking pain medicine. °· Keep all your follow-up appointments with your caregiver. Ask when your test results will be ready. Make sure you get your test results. °SEEK MEDICAL CARE IF:  °· You have redness, swelling, or increasing pain in the biopsy site. °· You have a bad smell coming from the biopsy site or dressing. °· Your biopsy site breaks open after the stitches (sutures), staples, or skin adhesive strips have been removed. °· You have a rash. °· You need stronger medicine. °SEEK IMMEDIATE MEDICAL CARE IF:  °· You have a fever. °· You have increased bleeding (more than a small spot) from the biopsy site. °· You have difficulty breathing. °· You have pus coming from the biopsy site. °MAKE SURE YOU: °· Understand these instructions. °· Will watch your condition. °· Will get help right away if you are not doing well or get worse. °Document Released: 10/20/2004 Document  Revised: 06/25/2011 Document Reviewed: 05/03/2011 °[employer]® Patient Information ©2014 [employer]. ° °

## 2013-08-17 NOTE — Anesthesia Preprocedure Evaluation (Signed)
Anesthesia Evaluation  Patient identified by MRN, date of birth, ID band Patient awake    Reviewed: Allergy & Precautions, H&P , NPO status , Patient's Chart, lab work & pertinent test results  History of Anesthesia Complications (+) PONV and history of anesthetic complications  Airway Mallampati: II TM Distance: >3 FB     Dental  (+) Teeth Intact   Pulmonary neg pulmonary ROS,  breath sounds clear to auscultation        Cardiovascular hypertension, Pt. on medications + CAD and + CABG Rhythm:Regular Rate:Normal     Neuro/Psych    GI/Hepatic GERD-  Medicated and Controlled,  Endo/Other    Renal/GU Renal InsufficiencyRenal disease     Musculoskeletal   Abdominal   Peds  Hematology   Anesthesia Other Findings   Reproductive/Obstetrics                           Anesthesia Physical Anesthesia Plan  ASA: III  Anesthesia Plan: General   Post-op Pain Management:    Induction: Intravenous  Airway Management Planned: LMA  Additional Equipment:   Intra-op Plan:   Post-operative Plan: Extubation in OR  Informed Consent: I have reviewed the patients History and Physical, chart, labs and discussed the procedure including the risks, benefits and alternatives for the proposed anesthesia with the patient or authorized representative who has indicated his/her understanding and acceptance.     Plan Discussed with:   Anesthesia Plan Comments:         Anesthesia Quick Evaluation

## 2013-08-19 ENCOUNTER — Encounter (HOSPITAL_COMMUNITY): Payer: Self-pay | Admitting: General Surgery

## 2013-09-09 DIAGNOSIS — H2510 Age-related nuclear cataract, unspecified eye: Secondary | ICD-10-CM | POA: Diagnosis not present

## 2013-11-27 ENCOUNTER — Other Ambulatory Visit: Payer: Self-pay | Admitting: Gastroenterology

## 2013-12-23 ENCOUNTER — Encounter: Payer: Self-pay | Admitting: Internal Medicine

## 2014-01-12 ENCOUNTER — Ambulatory Visit (INDEPENDENT_AMBULATORY_CARE_PROVIDER_SITE_OTHER): Payer: Medicare Other | Admitting: Urology

## 2014-01-12 DIAGNOSIS — N401 Enlarged prostate with lower urinary tract symptoms: Secondary | ICD-10-CM | POA: Diagnosis not present

## 2014-01-12 DIAGNOSIS — N529 Male erectile dysfunction, unspecified: Secondary | ICD-10-CM | POA: Diagnosis not present

## 2014-01-12 DIAGNOSIS — R972 Elevated prostate specific antigen [PSA]: Secondary | ICD-10-CM

## 2014-01-12 DIAGNOSIS — N400 Enlarged prostate without lower urinary tract symptoms: Secondary | ICD-10-CM | POA: Diagnosis not present

## 2014-01-12 DIAGNOSIS — N318 Other neuromuscular dysfunction of bladder: Secondary | ICD-10-CM

## 2014-01-12 DIAGNOSIS — N138 Other obstructive and reflux uropathy: Secondary | ICD-10-CM | POA: Diagnosis not present

## 2014-01-14 ENCOUNTER — Ambulatory Visit (INDEPENDENT_AMBULATORY_CARE_PROVIDER_SITE_OTHER): Payer: Medicare Other | Admitting: Internal Medicine

## 2014-01-14 ENCOUNTER — Encounter: Payer: Self-pay | Admitting: Internal Medicine

## 2014-01-14 ENCOUNTER — Telehealth: Payer: Self-pay

## 2014-01-14 VITALS — BP 149/74 | HR 70 | Temp 97.4°F | Ht 70.0 in | Wt 182.4 lb

## 2014-01-14 DIAGNOSIS — R197 Diarrhea, unspecified: Secondary | ICD-10-CM | POA: Diagnosis not present

## 2014-01-14 DIAGNOSIS — K219 Gastro-esophageal reflux disease without esophagitis: Secondary | ICD-10-CM

## 2014-01-14 DIAGNOSIS — K589 Irritable bowel syndrome without diarrhea: Secondary | ICD-10-CM | POA: Diagnosis not present

## 2014-01-14 MED ORDER — HYOSCYAMINE SULFATE 0.125 MG SL SUBL: 0.1250 mg | SUBLINGUAL_TABLET | Freq: Four times a day (QID) | SUBLINGUAL | Status: DC

## 2014-01-14 MED ORDER — RIFAXIMIN 550 MG PO TABS: 550.0000 mg | ORAL_TABLET | Freq: Three times a day (TID) | ORAL | Status: DC

## 2014-01-14 NOTE — Patient Instructions (Signed)
Serum IGA level and Serum TTG IGA level to screen for celiac disease - chronic diarrhea  Levsin SL 0.125 mg SL before meals and at bedtime as needed for loose stools (disp 40 with 3 refills)  Xifaxin 550 mg three times daily x 2 weeks (one prescription with no refills)  Stop Dicyclomine for now  Continue Metamucil, probiotic daily  Keep a stool diary  Office visit with me in 3 months

## 2014-01-14 NOTE — Progress Notes (Signed)
Primary Care Physician:  Purvis Kilts, MD Primary Gastroenterologist:  Dr. Gala Romney  Pre-Procedure History & Physical: HPI:  [name] is a 68 y.o. male here for followup of GERD and IBS D.  Doing well on Prilosec 20 mg daily. No dysphagia recently. Intermittent mushy BMs sometimes diarrhea. Describes only 10% normal. Diagnosis of IBS been long-standing. I do not see a celiac screen in the medical record. Takes a probiotic and Metamucil daily. Feels probiotic helps more than anything else. Occasionally takes a one half dicyclomine daily. No bleeding. Last colonoscopy diverticulosis 2013. Last EGD 2009 impaction cortical ring dilated. Weight is stable at 182 pounds per  Past Medical History  Diagnosis Date  . Arteriosclerotic cardiovascular disease (ASCVD)     CABG in 08/1998  . GERD (gastroesophageal reflux disease)     s/p espohageal dilatatin and repair of hiatal hernia  . Hypertension   . Hyperlipidemia   . BPH (benign prostatic hypertrophy)     h/x of prostatis/ UTI in 2006  . Sigmoid diverticulosis   . PONV (postoperative nausea and vomiting)   . Chronic kidney disease, stage II (mild) 2010    Creatinine of 1.5 in 2010  . Coronary artery disease   . IBS (irritable bowel syndrome)     Past Surgical History  Procedure Laterality Date  . Cholecystectomy  1990s  . Hiatal hernia repair  1995  . Lumbar laminectomy  1999  . Hemorrhoid surgery  2007    total of three  . Coronary artery bypass graft  2000  . Colonoscopy  03/17/2012    Rourk; colonic diverticulosis, repeat screening colonoscopy in 10 years.  . Esophagogastroduodenoscopy  May 2009    Rourk: Food impaction, critical esophageal ring/stricture requiring dilation  . Breast biopsy Right 08/17/2013    Procedure: RIGHT BREAST BIOPSY;  Surgeon: [name], MD;  Location: [facility];  Service: General;  Laterality: Right;    Prior to Admission medications   Medication Sig Start Date End Date Taking? Authorizing  Provider  acetaminophen (TYLENOL) 325 MG tablet Take 650 mg by mouth every 6 (six) hours as needed for moderate pain.   Yes Historical Provider, MD  amLODipine (NORVASC) 10 MG tablet Take 5 mg by mouth every morning.    Yes Historical Provider, MD  aspirin 81 MG EC tablet Take 81 mg by mouth at bedtime.    Yes Historical Provider, MD  Coenzyme Q10 (CO Q 10) 100 MG CAPS Take 100 mg by mouth every morning.    Yes Historical Provider, MD  dicyclomine (BENTYL) 20 MG tablet TAKE 1/2 TABLET BY MOUTH 3 TIMES A DAY BEFORE MEALS AS NEEDED FOR ABDOMINAL CRAMPS & DIARRHEA 11/30/13  Yes Mahala Menghini, PA-C  doxycycline (VIBRAMYCIN) 100 MG capsule Take 100 mg by mouth 2 (two) times daily.  07/28/13  Yes Historical Provider, MD  hydrochlorothiazide (HYDRODIURIL) 25 MG tablet Take 25 mg by mouth every morning.   Yes Historical Provider, MD  omeprazole (PRILOSEC) 20 MG capsule Take 20 mg by mouth 2 (two) times daily. Before LUNCH and Before SUPPER   Yes Historical Provider, MD  pravastatin (PRAVACHOL) 20 MG tablet Take 20 mg by mouth every evening.   Yes Historical Provider, MD  Probiotic Product (PROBIOTIC DAILY PO) Take by mouth.   Yes Historical Provider, MD  psyllium (METAMUCIL) 58.6 % powder Take 1 packet by mouth daily.   Yes Historical Provider, MD  sildenafil (VIAGRA) 50 MG tablet Take 50 mg by mouth daily  as needed.   Yes Historical Provider, MD  solifenacin (VESICARE) 5 MG tablet Take 5 mg by mouth daily.   Yes Historical Provider, MD  finasteride (PROSCAR) 5 MG tablet Take 5 mg by mouth every evening.    Historical Provider, MD  sucralfate (CARAFATE) 1 GM/10ML suspension Take 10 mLs (1 g total) by mouth 4 (four) times daily. 06/24/13   Daneil Dolin, MD  tamsulosin (FLOMAX) 0.4 MG CAPS capsule Take 0.4 mg by mouth at bedtime.    Historical Provider, MD  traMADol-acetaminophen (ULTRACET) 37.5-325 MG per tablet Take 1 tablet by mouth every 6 (six) hours as needed for moderate pain or severe pain (Muscle  pain). 08/17/13   [name], MD    Allergies as of 01/14/2014 - Review Complete 01/14/2014  Allergen Reaction Noted  . Codeine Nausea And Vomiting 04/05/2009  . Demerol [meperidine] Nausea And Vomiting 03/04/2012  . Nalbuphine Nausea And Vomiting 04/05/2009    Family History  Problem Relation Age of Onset  . Colon cancer Neg Hx   . Coronary artery disease Father     also grandfather    History   Social History  . Marital Status: Married    Spouse Name: N/A    Number of Children: N/A  . Years of Education: N/A   Occupational History  . 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 Smoking status: Never Smoker   . Smokeless tobacco: Never Used     Comment: no use of tobacco products  . Alcohol Use: 1.0 oz/week    2 drink(s) per week     Comment: "Occasionally"  . Drug Use: No  . Sexual Activity: Not on file   Other Topics Concern  . Not on file   Social History Narrative   Previously  Employed as an Recruitment consultant.     Review of Systems: See HPI, otherwise negative ROS  Physical Exam: BP 149/74  Pulse 70  Temp(Src) 97.4 F (36.3 C) (Oral)  Ht 5\' 10"  (1.778 m)  Wt 182 lb 6.4 oz (82.736 kg)  BMI 26.17 kg/m2 General:   Alert,  Well-developed, well-nourished, pleasant and cooperative in NAD Skin:  Intact without significant lesions or rashes. Eyes:  Sclera clear, no icterus.   Conjunctiva pink. Ears:  Normal auditory acuity. Nose:  No deformity, discharge,  or lesions. Mouth:  No deformity or lesions. Neck:  Supple; no masses or thyromegaly. No significant cervical adenopathy. Lungs:  Clear throughout to auscultation.   No wheezes, crackles, or rhonchi. No acute distress. Heart:  Regular rate and rhythm; no murmurs, clicks, rubs,  or gallops. Abdomen: Non-distended, normal bowel sounds.  Soft and nontender without appreciable mass or hepatosplenomegaly.  Pulses:  Normal pulses noted. Extremities:  Without clubbing or  edema.  Impression:  Very pleasant 68 year old gentleman with IBS-D.  Discussed the benign but chronic nature of this syndrome. His IBS symptoms are not yet optimally controlled. He may benefit from a trial of Xifaxan as well switching from dicyclomine to when necessary Levsin. Metamucil may or may not be beneficial at this time. He needs a celiac screen.GERD under good control with Prilosec.   Recommendations:  Serum IGA level and Serum TTG IGA level to screen for celiac disease - chronic diarrhea  Levsin SL 0.125 mg SL before meals and at bedtime as needed for loose stools (disp 40 with 3 refills)  Xifaxin 550 mg three times daily x 2 weeks (one prescription with no refills)  Stop  Dicyclomine for now  Continue Metamucil, probiotic daily  Keep a stool diary  Office visit with me in 3 months    Notice: This dictation was prepared with Dragon dictation along with smaller phrase technology. Any transcriptional errors that result from this process are unintentional and may not be corrected upon review.

## 2014-01-14 NOTE — Telephone Encounter (Signed)
Pt called- left voicemail- the xifaxan is over $300.00. Pt cannot afford it.

## 2014-01-15 LAB — GLIA (IGA/G) + TTG IGA
GLIADIN IGA: 12.9 U/mL (ref <20)
GLIADIN IGG: 2.4 U/mL (ref <20)
Tissue Transglutaminase Ab, IgA: 4.5 U/mL (ref <20)

## 2014-01-15 LAB — IGA: IgA: 252 mg/dL (ref 68–379)

## 2014-01-15 LAB — TISSUE TRANSGLUTAMINASE, IGA: Tissue Transglutaminase Ab, IgA: 4.9 U/mL (ref <20)

## 2014-01-18 NOTE — Telephone Encounter (Signed)
Was he given the copay card? We may need to do a prior authorization. May need to contact Christy the rep to see if she can help.

## 2014-01-18 NOTE — Telephone Encounter (Signed)
Pt has medicare and cannot use copay card and since he has insurance, he cannot get patient assistance.

## 2014-01-18 NOTE — Telephone Encounter (Signed)
Will the rep provide samples to cover this?

## 2014-01-18 NOTE — Telephone Encounter (Signed)
Not really in terms of FDA-approved ABX; Can we give him the course of treatment via samples?

## 2014-01-18 NOTE — Telephone Encounter (Signed)
Dr.Rourk, pt cannot afford xifaxan. Is there anything else he can try?

## 2014-01-19 ENCOUNTER — Telehealth: Payer: Self-pay | Admitting: Internal Medicine

## 2014-01-19 NOTE — Telephone Encounter (Signed)
See phone note dated 10/1. It would be three times a day for 2 weeks.

## 2014-01-19 NOTE — Telephone Encounter (Signed)
Dr.Rourk, I only have enough samples for 1 week of treatment. Not sure when we will be able to get more samples in the office. Rep wasn't sure either. Do you want to try something different?

## 2014-01-19 NOTE — Telephone Encounter (Signed)
Pt said that he was seen last Thursday and the antibiotic was too expensive. His pharmacy said that they havent received anything else from [place]. Per GF she is going to check on how much he takes and get some xifixan samples together. Please call him at [phone]

## 2014-01-19 NOTE — Telephone Encounter (Signed)
How many samples would you like for him to have of the [place]

## 2014-01-20 NOTE — Telephone Encounter (Signed)
no

## 2014-02-03 DIAGNOSIS — R7301 Impaired fasting glucose: Secondary | ICD-10-CM | POA: Diagnosis not present

## 2014-02-03 DIAGNOSIS — I10 Essential (primary) hypertension: Secondary | ICD-10-CM | POA: Diagnosis not present

## 2014-02-03 DIAGNOSIS — Z23 Encounter for immunization: Secondary | ICD-10-CM | POA: Diagnosis not present

## 2014-02-03 DIAGNOSIS — E782 Mixed hyperlipidemia: Secondary | ICD-10-CM | POA: Diagnosis not present

## 2014-02-03 DIAGNOSIS — Z6826 Body mass index (BMI) 26.0-26.9, adult: Secondary | ICD-10-CM | POA: Diagnosis not present

## 2014-02-03 DIAGNOSIS — E663 Overweight: Secondary | ICD-10-CM | POA: Diagnosis not present

## 2014-02-09 DIAGNOSIS — R972 Elevated prostate specific antigen [PSA]: Secondary | ICD-10-CM | POA: Diagnosis not present

## 2014-02-18 NOTE — Telephone Encounter (Signed)
Finally got enough xifaxan samples for the whole 14 days. Ok to still give them to pt per AS. #7 boxes up front for the pt to pick up. Pt is aware and will come by tomorrow morning to pick up. Directions on the bag on how to take it.

## 2014-02-25 ENCOUNTER — Other Ambulatory Visit: Payer: Self-pay | Admitting: Physician Assistant

## 2014-02-25 DIAGNOSIS — L570 Actinic keratosis: Secondary | ICD-10-CM | POA: Diagnosis not present

## 2014-02-25 DIAGNOSIS — D0462 Carcinoma in situ of skin of left upper limb, including shoulder: Secondary | ICD-10-CM | POA: Diagnosis not present

## 2014-02-25 DIAGNOSIS — D485 Neoplasm of uncertain behavior of skin: Secondary | ICD-10-CM | POA: Diagnosis not present

## 2014-02-25 DIAGNOSIS — D0430 Carcinoma in situ of skin of unspecified part of face: Secondary | ICD-10-CM | POA: Diagnosis not present

## 2014-02-25 DIAGNOSIS — D0439 Carcinoma in situ of skin of other parts of face: Secondary | ICD-10-CM | POA: Diagnosis not present

## 2014-03-08 ENCOUNTER — Telehealth: Payer: Self-pay | Admitting: Internal Medicine

## 2014-03-08 NOTE — Telephone Encounter (Signed)
I spoke with the pt. He has been taking the xifaxan samples. He said ever since he started taking it, his BM's have been worse- he has had some good days but mostly bad days with diarrhea 1-5 times in a day. He is not taking any more of the xifaxan. Dr.Rourk, is there anything else he needs to do?

## 2014-03-08 NOTE — Telephone Encounter (Signed)
Pt said that RMR told him to call if he doesn't feel any better.  He said that he feels much worse since taking the antibiotic and wanted to know what else RMR would recommend. Patient also said that he has kept a BM diary that he wanted to let RMR know about. Please call  [phone] or [phone]

## 2014-03-09 NOTE — Telephone Encounter (Signed)
Stop Xifaxan; send me a copy of his stool diary

## 2014-03-10 NOTE — Telephone Encounter (Signed)
I spoke with the pts wife- [name], she is aware and will have him bring [place] the stool diary

## 2014-03-25 DIAGNOSIS — D0430 Carcinoma in situ of skin of unspecified part of face: Secondary | ICD-10-CM | POA: Diagnosis not present

## 2014-03-25 DIAGNOSIS — D0460 Carcinoma in situ of skin of unspecified upper limb, including shoulder: Secondary | ICD-10-CM | POA: Diagnosis not present

## 2014-04-06 ENCOUNTER — Ambulatory Visit: Payer: Medicare Other | Admitting: Urology

## 2014-04-13 ENCOUNTER — Encounter: Payer: Self-pay | Admitting: Internal Medicine

## 2014-04-20 DIAGNOSIS — L600 Ingrowing nail: Secondary | ICD-10-CM | POA: Diagnosis not present

## 2014-05-04 DIAGNOSIS — L600 Ingrowing nail: Secondary | ICD-10-CM | POA: Diagnosis not present

## 2014-05-14 ENCOUNTER — Ambulatory Visit (INDEPENDENT_AMBULATORY_CARE_PROVIDER_SITE_OTHER): Payer: Medicare Other | Admitting: Internal Medicine

## 2014-05-14 ENCOUNTER — Encounter: Payer: Self-pay | Admitting: Internal Medicine

## 2014-05-14 VITALS — BP 142/72 | HR 61 | Temp 97.6°F | Ht 70.0 in | Wt 180.6 lb

## 2014-05-14 DIAGNOSIS — K219 Gastro-esophageal reflux disease without esophagitis: Secondary | ICD-10-CM

## 2014-05-14 DIAGNOSIS — K589 Irritable bowel syndrome without diarrhea: Secondary | ICD-10-CM | POA: Diagnosis not present

## 2014-05-14 NOTE — Progress Notes (Signed)
Primary Care Physician:  Purvis Kilts, MD Primary Gastroenterologist:  Dr. Gala Romney  Pre-Procedure History & Physical: HPI:  [name] is a 69 y.o. male here for followup of postprandial abdominal cramps and diarrhea. Celiac screen negative. States diarrhea got worse on Xifaxan and then got better and has done very well on Metamucil daily and OTC probiotic. Only 3 episodes nonbloody diarrhea in the past 3 months. He's very happy. GERD well-controlled on omeprazole 20 mg twice daily.   Past Medical History  Diagnosis Date  . Arteriosclerotic cardiovascular disease (ASCVD)     CABG in 08/1998  . GERD (gastroesophageal reflux disease)     s/p espohageal dilatatin and repair of hiatal hernia  . Hypertension   . Hyperlipidemia   . BPH (benign prostatic hypertrophy)     h/x of prostatis/ UTI in 2006  . Sigmoid diverticulosis   . PONV (postoperative nausea and vomiting)   . Chronic kidney disease, stage II (mild) 2010    Creatinine of 1.5 in 2010  . Coronary artery disease   . IBS (irritable bowel syndrome)     Past Surgical History  Procedure Laterality Date  . Cholecystectomy  1990s  . Hiatal hernia repair  1995  . Lumbar laminectomy  1999  . Hemorrhoid surgery  2007    total of three  . Coronary artery bypass graft  2000  . Colonoscopy  03/17/2012    Sherika Kubicki; colonic diverticulosis, repeat screening colonoscopy in 10 years.  . Esophagogastroduodenoscopy  May 2009    Rodrigus Kilker: Food impaction, critical esophageal ring/stricture requiring dilation  . Breast biopsy Right 08/17/2013    Procedure: RIGHT BREAST BIOPSY;  Surgeon: [name], MD;  Location: [facility];  Service: General;  Laterality: Right;    Prior to Admission medications   Medication Sig Start Date End Date Taking? Authorizing Provider  acetaminophen (TYLENOL) 325 MG tablet Take 650 mg by mouth every 6 (six) hours as needed for moderate pain.   Yes Historical Provider, MD  amLODipine (NORVASC) 10 MG tablet  Take 5 mg by mouth every morning.    Yes Historical Provider, MD  aspirin 81 MG EC tablet Take 81 mg by mouth at bedtime.    Yes Historical Provider, MD  Coenzyme Q10 (CO Q 10) 100 MG CAPS Take 100 mg by mouth every morning.    Yes Historical Provider, MD  dicyclomine (BENTYL) 20 MG tablet TAKE 1/2 TABLET BY MOUTH 3 TIMES A DAY BEFORE MEALS AS NEEDED FOR ABDOMINAL CRAMPS & DIARRHEA 11/30/13  Yes Mahala Menghini, PA-C  doxycycline (VIBRAMYCIN) 100 MG capsule Take 100 mg by mouth 2 (two) times daily.  07/28/13  Yes Historical Provider, MD  finasteride (PROSCAR) 5 MG tablet Take 5 mg by mouth every evening.   Yes Historical Provider, MD  hydrochlorothiazide (HYDRODIURIL) 25 MG tablet Take 25 mg by mouth every morning.   Yes Historical Provider, MD  hyoscyamine (LEVSIN SL) 0.125 MG SL tablet Place 1 tablet (0.125 mg total) under the tongue 4 (four) times daily. Before meals and at bedtime as needed for loose stools 01/14/14  Yes Daneil Dolin, MD  omeprazole (PRILOSEC) 20 MG capsule Take 20 mg by mouth 2 (two) times daily. Before LUNCH and Before SUPPER   Yes Historical Provider, MD  pravastatin (PRAVACHOL) 20 MG tablet Take 20 mg by mouth every evening.   Yes Historical Provider, MD  Probiotic Product (PROBIOTIC DAILY PO) Take by mouth.   Yes Historical Provider, MD  psyllium (  METAMUCIL) 58.6 % powder Take 1 packet by mouth daily.   Yes Historical Provider, MD  sildenafil (VIAGRA) 50 MG tablet Take 50 mg by mouth daily as needed.   Yes Historical Provider, MD  solifenacin (VESICARE) 5 MG tablet Take 5 mg by mouth daily.   Yes Historical Provider, MD  tamsulosin (FLOMAX) 0.4 MG CAPS capsule Take 0.4 mg by mouth at bedtime.   Yes Historical Provider, MD  rifaximin (XIFAXAN) 550 MG TABS tablet Take 1 tablet (550 mg total) by mouth 3 (three) times daily. Take three times a day for 2 weeks Patient not taking: Reported on 05/14/2014 01/14/14   Daneil Dolin, MD  sucralfate (CARAFATE) 1 GM/10ML suspension Take 10  mLs (1 g total) by mouth 4 (four) times daily. Patient not taking: Reported on 05/14/2014 06/24/13   Daneil Dolin, MD  traMADol-acetaminophen (ULTRACET) 37.5-325 MG per tablet Take 1 tablet by mouth every 6 (six) hours as needed for moderate pain or severe pain (Muscle pain). Patient not taking: Reported on 05/14/2014 08/17/13   [name], MD    Allergies as of 05/14/2014 - Review Complete 05/14/2014  Allergen Reaction Noted  . Codeine Nausea And Vomiting 04/05/2009  . Demerol [meperidine] Nausea And Vomiting 03/04/2012  . Nalbuphine Nausea And Vomiting 04/05/2009    Family History  Problem Relation Age of Onset  . Colon cancer Neg Hx   . Coronary artery disease Father     also grandfather    History   Social History  . Marital Status: Married    Spouse Name: N/A    Number of Children: N/A  . Years of Education: N/A   Occupational History  . 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 Smoking status: Never Smoker   . Smokeless tobacco: Never Used     Comment: no use of tobacco products  . Alcohol Use: 1.0 oz/week    2 drink(s) per week     Comment: "Occasionally"  . Drug Use: No  . Sexual Activity: Not on file   Other Topics Concern  . Not on file   Social History Narrative   Previously  Employed as an Recruitment consultant.     Review of Systems: See HPI, otherwise negative ROS  Physical Exam: BP 142/72 mmHg  Pulse 61  Temp(Src) 97.6 F (36.4 C) (Oral)  Ht 5\' 10"  (1.778 m)  Wt 180 lb 9.6 oz (81.92 kg)  BMI 25.91 kg/m2 General:   Alert,  Well-developed, well-nourished, pleasant and cooperative in NAD Skin:  Intact without significant lesions or rashes. Eyes:  Sclera clear, no icterus.   Conjunctiva pink. Ears:  Normal auditory acuity. Nose:  No deformity, discharge,  or lesions. Mouth:  No deformity or lesions. Neck:  Supple; no masses or thyromegaly. No significant cervical adenopathy. Lungs:  Clear throughout to  auscultation.   No wheezes, crackles, or rhonchi. No acute distress. Heart:  Regular rate and rhythm; no murmurs, clicks, rubs,  or gallops. Abdomen: Non-distended, normal bowel sounds.  Soft and nontender without appreciable mass or hepatosplenomegaly.  Pulses:  Normal pulses noted. Extremities:  Without clubbing or edema.  Impression: IBS-D.  Currently in remission-not cured /no cure as discussed with the patient. Doing very well with daily fiber and probiotic supplementation. Rare use of hyoscyamine when necessary. GERD well-controlled.  Recommendations:  Use probiotic liberally  Use Levsin under the tongue as needed. Continue omeprazole daily.  OK with Metamucil daily  Continue omeprazole 20 mg twice  daily  Office visit in 6 months   Notice: This dictation was prepared with Dragon dictation along with smaller phrase technology. Any transcriptional errors that result from this process are unintentional and may not be corrected upon review.

## 2014-05-14 NOTE — Patient Instructions (Signed)
Use probiotic liberally  Use Levsin under the tongue as needed  OK with Metamucil daily  Continue omeprazole 20 mg twice daily  Office visit in 6 months

## 2014-05-25 ENCOUNTER — Other Ambulatory Visit: Payer: Self-pay

## 2014-05-25 DIAGNOSIS — L600 Ingrowing nail: Secondary | ICD-10-CM | POA: Diagnosis not present

## 2014-05-25 MED ORDER — HYOSCYAMINE SULFATE 0.125 MG SL SUBL: 0.1250 mg | SUBLINGUAL_TABLET | Freq: Four times a day (QID) | SUBLINGUAL | Status: DC

## 2014-07-20 ENCOUNTER — Other Ambulatory Visit: Payer: Self-pay | Admitting: Physician Assistant

## 2014-07-20 DIAGNOSIS — D485 Neoplasm of uncertain behavior of skin: Secondary | ICD-10-CM | POA: Diagnosis not present

## 2014-07-20 DIAGNOSIS — L821 Other seborrheic keratosis: Secondary | ICD-10-CM | POA: Diagnosis not present

## 2014-07-20 DIAGNOSIS — L080 Pyoderma: Secondary | ICD-10-CM | POA: Diagnosis not present

## 2014-07-22 DIAGNOSIS — Z6826 Body mass index (BMI) 26.0-26.9, adult: Secondary | ICD-10-CM | POA: Diagnosis not present

## 2014-07-22 DIAGNOSIS — R7302 Impaired glucose tolerance (oral): Secondary | ICD-10-CM | POA: Diagnosis not present

## 2014-07-22 DIAGNOSIS — E782 Mixed hyperlipidemia: Secondary | ICD-10-CM | POA: Diagnosis not present

## 2014-07-22 DIAGNOSIS — I10 Essential (primary) hypertension: Secondary | ICD-10-CM | POA: Diagnosis not present

## 2014-07-22 DIAGNOSIS — E663 Overweight: Secondary | ICD-10-CM | POA: Diagnosis not present

## 2014-07-22 DIAGNOSIS — R7301 Impaired fasting glucose: Secondary | ICD-10-CM | POA: Diagnosis not present

## 2014-08-17 DIAGNOSIS — N183 Chronic kidney disease, stage 3 (moderate): Secondary | ICD-10-CM | POA: Diagnosis not present

## 2014-09-29 ENCOUNTER — Encounter: Payer: Self-pay | Admitting: Internal Medicine

## 2014-10-28 DIAGNOSIS — H43393 Other vitreous opacities, bilateral: Secondary | ICD-10-CM | POA: Diagnosis not present

## 2014-12-10 DIAGNOSIS — L600 Ingrowing nail: Secondary | ICD-10-CM | POA: Diagnosis not present

## 2014-12-14 DIAGNOSIS — R972 Elevated prostate specific antigen [PSA]: Secondary | ICD-10-CM | POA: Diagnosis not present

## 2015-01-11 ENCOUNTER — Ambulatory Visit (INDEPENDENT_AMBULATORY_CARE_PROVIDER_SITE_OTHER): Payer: Medicare Other | Admitting: Urology

## 2015-01-11 DIAGNOSIS — N529 Male erectile dysfunction, unspecified: Secondary | ICD-10-CM

## 2015-01-11 DIAGNOSIS — R972 Elevated prostate specific antigen [PSA]: Secondary | ICD-10-CM

## 2015-01-11 DIAGNOSIS — N401 Enlarged prostate with lower urinary tract symptoms: Secondary | ICD-10-CM

## 2015-06-21 ENCOUNTER — Ambulatory Visit (INDEPENDENT_AMBULATORY_CARE_PROVIDER_SITE_OTHER): Payer: Medicare Other | Admitting: Adult Health

## 2015-06-21 ENCOUNTER — Telehealth: Payer: Self-pay | Admitting: *Deleted

## 2015-06-21 VITALS — BP 130/78 | HR 77 | Ht 70.0 in | Wt 180.0 lb

## 2015-06-21 DIAGNOSIS — I351 Nonrheumatic aortic (valve) insufficiency: Secondary | ICD-10-CM

## 2015-06-21 DIAGNOSIS — N182 Chronic kidney disease, stage 2 (mild): Secondary | ICD-10-CM | POA: Diagnosis not present

## 2015-06-21 DIAGNOSIS — R072 Precordial pain: Secondary | ICD-10-CM

## 2015-06-21 DIAGNOSIS — R0789 Other chest pain: Secondary | ICD-10-CM

## 2015-06-21 NOTE — Progress Notes (Signed)
Name: [name]    DOB: 1945-10-12  Age: 70 y.o.  MR#: [id number]       PCP:  Purvis Kilts, MD      Insurance: Payor: MEDICARE / Plan: MEDICARE PART A AND B / Product Type: *No Product type* /   CC:   No chief complaint on file.   VS Filed Vitals:   06/21/15 1523  BP: 130/78  Pulse: 77  Height: 5\' 10"  (1.778 m)  Weight: 180 lb (81.647 kg)  SpO2: 94%    Weights Current Weight  06/21/15 180 lb (81.647 kg)  05/14/14 180 lb 9.6 oz (81.92 kg)  01/14/14 182 lb 6.4 oz (82.736 kg)    Blood Pressure  BP Readings from Last 3 Encounters:  06/21/15 130/78  05/14/14 142/72  01/14/14 149/74     Admit date:  (Not on file) Last encounter with RMR:  Visit date not found   Allergy Codeine; Demerol; and Nalbuphine  Current Outpatient Prescriptions  Medication Sig Dispense Refill  . acetaminophen (TYLENOL) 325 MG tablet Take 650 mg by mouth every 6 (six) hours as needed for moderate pain.    Marland Kitchen amLODipine (NORVASC) 10 MG tablet Take 5 mg by mouth every morning.     Marland Kitchen aspirin 81 MG EC tablet Take 81 mg by mouth at bedtime.     . Coenzyme Q10 (CO Q 10) 100 MG CAPS Take 100 mg by mouth every morning.     . dicyclomine (BENTYL) 20 MG tablet TAKE 1/2 TABLET BY MOUTH 3 TIMES A DAY BEFORE MEALS AS NEEDED FOR ABDOMINAL CRAMPS & DIARRHEA 90 tablet 5  . doxycycline (VIBRAMYCIN) 100 MG capsule Take 100 mg by mouth 2 (two) times daily.     . finasteride (PROSCAR) 5 MG tablet Take 5 mg by mouth every evening.    . hydrochlorothiazide (HYDRODIURIL) 25 MG tablet Take 25 mg by mouth every morning.    . hyoscyamine (LEVSIN SL) 0.125 MG SL tablet Place 1 tablet (0.125 mg total) under the tongue 4 (four) times daily. Before meals and at bedtime as needed for loose stools 120 tablet 3  . omeprazole (PRILOSEC) 20 MG capsule Take 20 mg by mouth 2 (two) times daily. Before LUNCH and Before SUPPER    . pravastatin (PRAVACHOL) 20 MG tablet Take 20 mg by mouth every evening.    . Probiotic Product  (PROBIOTIC DAILY PO) Take by mouth.    . psyllium (METAMUCIL) 58.6 % powder Take 1 packet by mouth daily.    . sildenafil (VIAGRA) 50 MG tablet Take 50 mg by mouth daily as needed.    . solifenacin (VESICARE) 5 MG tablet Take 5 mg by mouth daily.    . sucralfate (CARAFATE) 1 GM/10ML suspension Take 10 mLs (1 g total) by mouth 4 (four) times daily. 420 mL 0  . tamsulosin (FLOMAX) 0.4 MG CAPS capsule Take 0.4 mg by mouth at bedtime.    . traMADol-acetaminophen (ULTRACET) 37.5-325 MG per tablet Take 1 tablet by mouth every 6 (six) hours as needed for moderate pain or severe pain (Muscle pain). 30 tablet 0   No current facility-administered medications for this visit.    Discontinued Meds:    Medications Discontinued During This Encounter  Medication Reason  . rifaximin (XIFAXAN) 550 MG TABS tablet Error    Patient Active Problem List   Diagnosis Date Noted  . IBS (irritable bowel syndrome) 08/03/2013  . Chest pain on exertion 07/16/2012  . Acute on chronic renal failure (  [place]) 07/16/2012  . Hyponatremia 07/16/2012  . Arteriosclerotic cardiovascular disease (ASCVD)   . GERD (gastroesophageal reflux disease)   . Hypertension   . Hyperlipidemia   . BPH (benign prostatic hypertrophy)   . CHRONIC KIDNEY DISEASE-STAGE II 04/05/2009    LABS    Component Value Date/Time   NA 141 08/12/2013 1445   NA 138 06/21/2013 1458   NA 136 07/18/2012 0537   K 4.3 08/12/2013 1445   K 4.4 06/21/2013 1458   K 5.1 07/18/2012 0537   CL 99 08/12/2013 1445   CL 98 06/21/2013 1458   CL 101 07/18/2012 0537   CO2 30 08/12/2013 1445   CO2 30 06/21/2013 1458   CO2 27 07/18/2012 0537   GLUCOSE 120* 08/12/2013 1445   GLUCOSE 114* 06/21/2013 1458   GLUCOSE 103* 07/18/2012 0537   BUN 17 08/12/2013 1445   BUN 19 06/21/2013 1458   BUN 18 07/18/2012 0537   CREATININE 1.60* 08/12/2013 1445   CREATININE 1.45* 06/21/2013 1458   CREATININE 1.79* 07/18/2012 0537   CREATININE 1.48* 04/14/2012 1409   CALCIUM  10.4 08/12/2013 1445   CALCIUM 10.0 06/21/2013 1458   CALCIUM 9.6 07/18/2012 0537   GFRNONAA 43* 08/12/2013 1445   GFRNONAA 49* 06/21/2013 1458   GFRNONAA 38* 07/18/2012 0537   GFRAA 50* 08/12/2013 1445   GFRAA 56* 06/21/2013 1458   GFRAA 44* 07/18/2012 0537   CMP     Component Value Date/Time   NA 141 08/12/2013 1445   K 4.3 08/12/2013 1445   CL 99 08/12/2013 1445   CO2 30 08/12/2013 1445   GLUCOSE 120* 08/12/2013 1445   BUN 17 08/12/2013 1445   CREATININE 1.60* 08/12/2013 1445   CREATININE 1.48* 04/14/2012 1409   CALCIUM 10.4 08/12/2013 1445   PROT 7.2 06/21/2013 1458   ALBUMIN 3.6 06/21/2013 1458   AST 16 06/21/2013 1458   ALT 17 06/21/2013 1458   ALKPHOS 66 06/21/2013 1458   BILITOT 0.9 06/21/2013 1458   GFRNONAA 43* 08/12/2013 1445   GFRAA 50* 08/12/2013 1445       Component Value Date/Time   WBC 7.1 08/12/2013 1445   WBC 10.2 06/21/2013 1458   WBC 7.6 07/17/2012 0247   HGB 14.7 08/12/2013 1445   HGB 15.1 06/21/2013 1458   HGB 13.6 07/17/2012 0247   HCT 42.7 08/12/2013 1445   HCT 44.3 06/21/2013 1458   HCT 39.6 07/17/2012 0247   MCV 95.1 08/12/2013 1445   MCV 96.1 06/21/2013 1458   MCV 96.8 07/17/2012 0247    Lipid Panel     Component Value Date/Time   CHOL 150 04/04/2011 1343   TRIG 116 04/04/2011 1343   HDL 45 04/04/2011 1343   CHOLHDL 3.3 04/04/2011 1343   VLDL 23 04/04/2011 1343   LDLCALC 82 04/04/2011 1343    ABG No results found for: PHART, PCO2ART, PO2ART, HCO3, TCO2, ACIDBASEDEF, O2SAT   Lab Results  Component Value Date   TSH 1.518 07/16/2012   BNP (last 3 results) No results for input(s): BNP in the last 8760 hours.  ProBNP (last 3 results) No results for input(s): PROBNP in the last 8760 hours.  Cardiac Panel (last 3 results) No results for input(s): CKTOTAL, CKMB, TROPONINI, RELINDX in the last 72 hours.  Iron/TIBC/Ferritin/ %Sat No results found for: IRON, TIBC, FERRITIN, IRONPCTSAT   EKG Orders placed or performed in  visit on 06/21/15  . EKG 12-Lead     Prior Assessment and Plan Problem List as of 06/21/2015  Cardiovascular and Mediastinum   Arteriosclerotic cardiovascular disease (ASCVD)   Last Assessment & Plan 07/30/2012 Office Visit Written 07/30/2012  1:24 PM by Lendon Colonel, NP    He said no recurrence of cardiac symptoms. Cardiac enzymes are found be negative during hospitalization. EKG showed no evidence of ACS. He continues medically compliant. We will concentrate on risk management only, to include statin and blood pressure Chol. We will see the patient in a year unless he becomes symptomatic. No cardiac testing is planned.      Hypertension   Last Assessment & Plan 07/30/2012 Office Visit Written 07/30/2012  1:25 PM by Lendon Colonel, NP    Blood pressure is well-controlled currently on amlodipine. He was taken off of lisinopril secondary to chronic kidney disease. No changes in his medication regimen        Digestive   GERD (gastroesophageal reflux disease)   Last Assessment & Plan 08/03/2013 Office Visit Edited 08/04/2013  8:18 AM by Mahala Menghini, PA-C    Recent flare of gastroesophageal reflux disease. Symptoms now under control. Would recommend continuing Prilosec twice a day. He has completed Carafate. Anti-reflex measures discussed. Office visit in 6 months.      IBS (irritable bowel syndrome)   Last Assessment & Plan 08/03/2013 Office Visit Edited 08/04/2013  8:20 AM by Mahala Menghini, PA-C    Complains of worsening IBS-D. Symptoms are more frequent than before. Has been on doxycycline for skin issues but notes that his diarrhea has resolved. Colonoscopy 2013 somewhat reassuring. Would recommend trial of dicyclomine if symptoms recur. Prescription provided for 20 mg tablet as this was covered by his insurance but he was instructed to take a half a tablet up to 3 times daily with meals as needed. Office visit in 6 months or sooner if needed. He was also instructed to take  probiotic daily for 4 weeks.        Genitourinary   CHRONIC KIDNEY DISEASE-STAGE II   Last Assessment & Plan 04/14/2012 Office Visit Edited 04/14/2012  5:05 PM by Yehuda Savannah, MD    Stable mild renal dysfunction.  Patient has limited use of nonsteroidals to the extent possible.  He has been treated with low to moderate dose lisinopril, which may be resulting in some increase in creatinine, but is the appropriate therapy for him.  We will continue to monitor renal function.      BPH (benign prostatic hypertrophy)   Acute on chronic renal failure [facility])     Other   Hyperlipidemia   Last Assessment & Plan 07/30/2012 Office Visit Written 07/30/2012  1:25 PM by Lendon Colonel, NP    He is followed by primary care physician. Ongoing labs annually are recommended. He is to continue on statin therapy with low cholesterol diet.      Chest pain on exertion   Hyponatremia       Imaging: No results found.

## 2015-06-21 NOTE — Telephone Encounter (Signed)
-----   Message from Lendon Colonel, NP sent at 06/21/2015  4:24 PM EST ----- Regarding: Need to add test Please order an exercise cardiolite stress test. Dx CAD with chest discomfort. He already has an echo ordered. Maybe we can put them both on the same day.   He is scheduled to see Dr. Harl Bowie in 3 months but would like him to see him in one month to discuss results of the test.   I have called the patient to let him know I am adding the stress test. He is in agreement to have it done.   Thank you

## 2015-06-21 NOTE — Patient Instructions (Signed)
Your physician wants you to follow-up in: 6 months with Dr Bryna Colander will receive a reminder letter in the mail two months in advance. If you don't receive a letter, please call our office to schedule the follow-up appointment.    Your physician recommends that you continue on your current medications as directed. Please refer to the Current Medication list given to you today.   If you need a refill on your cardiac medications before your next appointment, please call your pharmacy.   Your physician has requested that you have an echocardiogram. Echocardiography is a painless test that uses sound waves to create images of your heart. It provides your doctor with information about the size and shape of your heart and how well your heart's chambers and valves are working. This procedure takes approximately one hour. There are no restrictions for this procedure.    Your physician recommends that you return for lab work in: Now BMET,magnesium    Thank you for choosing [facility] !

## 2015-06-21 NOTE — Progress Notes (Signed)
Cardiology Office Note   Date:  06/21/2015   ID:  Michael, Sweeney March 27, 1946, MRN [id number]  PCP:  Purvis Kilts, MD  Cardiologist: Needs to be established  Jory Sims, NP   Chief Complaint  Patient presents with  . Chest Pain      History of Present Illness: [name] is a 70 y.o. male who presents for ongoing assessment and management of CAD, with hx of CABG in 2000, hypertension, hyperlipidemia, and GERD. He comes today after not being seen for 3 years.  He had been doing well and remains very active hunting playing golf walking.  However, this week.  He has been having intermittent chest pressure.  He states he feels at rest, but not while exerting himself.  He said he chops and would, carried some heavy objects, and throat no complaints of chest pain, and has not been more fatigued.  He states he felt a little bit of pressure when he was at rest.  He states that it lasted only a few minutes and went away.  It was not associated with shortness of breath, dizziness, diaphoresis, or rapid heart rhythm.   Past Medical History  Diagnosis Date  . Arteriosclerotic cardiovascular disease (ASCVD)     CABG in 08/1998  . GERD (gastroesophageal reflux disease)     s/p espohageal dilatatin and repair of hiatal hernia  . Hypertension   . Hyperlipidemia   . BPH (benign prostatic hypertrophy)     h/x of prostatis/ UTI in 2006  . Sigmoid diverticulosis   . PONV (postoperative nausea and vomiting)   . Chronic kidney disease, stage II (mild) 2010    Creatinine of 1.5 in 2010  . Coronary artery disease   . IBS (irritable bowel syndrome)     Past Surgical History  Procedure Laterality Date  . Cholecystectomy  1990s  . Hiatal hernia repair  1995  . Lumbar laminectomy  1999  . Hemorrhoid surgery  2007    total of three  . Coronary artery bypass graft  2000  . Colonoscopy  03/17/2012    [place]; colonic diverticulosis, repeat screening colonoscopy in 10 years.  .  Esophagogastroduodenoscopy  May 2009    [place]k: Food impaction, critical esophageal ring/stricture requiring dilation  . Breast biopsy Right 08/17/2013    Procedure: RIGHT BREAST BIOPSY;  Surgeon: [name], MD;  Location: [facility];  Service: General;  Laterality: Right;     Current Outpatient Prescriptions  Medication Sig Dispense Refill  . acetaminophen (TYLENOL) 325 MG tablet Take 650 mg by mouth every 6 (six) hours as needed for moderate pain.    Marland Kitchen amLODipine (NORVASC) 10 MG tablet Take 5 mg by mouth every morning.     Marland Kitchen aspirin 81 MG EC tablet Take 81 mg by mouth at bedtime.     . Coenzyme Q10 (CO Q 10) 100 MG CAPS Take 100 mg by mouth every morning.     . dicyclomine (BENTYL) 20 MG tablet TAKE 1/2 TABLET BY MOUTH 3 TIMES A DAY BEFORE MEALS AS NEEDED FOR ABDOMINAL CRAMPS & DIARRHEA 90 tablet 5  . doxycycline (VIBRAMYCIN) 100 MG capsule Take 100 mg by mouth 2 (two) times daily.     . finasteride (PROSCAR) 5 MG tablet Take 5 mg by mouth every evening.    . hydrochlorothiazide (HYDRODIURIL) 25 MG tablet Take 25 mg by mouth every morning.    . hyoscyamine (LEVSIN SL) 0.125 MG SL tablet Place 1 tablet (0.125 mg total)  under the tongue 4 (four) times daily. Before meals and at bedtime as needed for loose stools 120 tablet 3  . omeprazole (PRILOSEC) 20 MG capsule Take 20 mg by mouth 2 (two) times daily. Before LUNCH and Before SUPPER    . pravastatin (PRAVACHOL) 20 MG tablet Take 20 mg by mouth every evening.    . Probiotic Product (PROBIOTIC DAILY PO) Take by mouth.    . psyllium (METAMUCIL) 58.6 % powder Take 1 packet by mouth daily.    . sildenafil (VIAGRA) 50 MG tablet Take 50 mg by mouth daily as needed.    . solifenacin (VESICARE) 5 MG tablet Take 5 mg by mouth daily.    . sucralfate (CARAFATE) 1 GM/10ML suspension Take 10 mLs (1 g total) by mouth 4 (four) times daily. 420 mL 0  . tamsulosin (FLOMAX) 0.4 MG CAPS capsule Take 0.4 mg by mouth at bedtime.    . traMADol-acetaminophen  (ULTRACET) 37.5-325 MG per tablet Take 1 tablet by mouth every 6 (six) hours as needed for moderate pain or severe pain (Muscle pain). 30 tablet 0   No current facility-administered medications for this visit.    Allergies:   Codeine; Demerol; and Nalbuphine    Social History:  The patient  reports that he has never smoked. He has never used smokeless tobacco. He reports that he drinks about 1.0 oz of alcohol per week. He reports that he does not use illicit drugs.   Family History:  The patient's family history includes Coronary artery disease in his father. There is no history of Colon cancer.    ROS: All other systems are reviewed and negative. Unless otherwise mentioned in H&P    PHYSICAL EXAM: VS:  BP 130/78 mmHg  Pulse 77  Ht 5\' 10"  (1.778 m)  Wt 180 lb (81.647 kg)  BMI 25.83 kg/m2  SpO2 94% , BMI Body mass index is 25.83 kg/(m^2). GEN: Well nourished, well developed, in no acute distress HEENT: normal Neck: no JVD, carotid bruits, or masses Cardiac: RRR;prominent aortic valve regurgitant murmur, no rubs, or gallops. Respiratory:  Clear to auscultation bilaterally, normal work of breathing GI: soft, nontender, nondistended, + BS MS: no deformity or atrophy Skin: warm and dry, no rash Neuro:  Strength and sensation are intact Psych: euthymic mood, full affect   EKG: The ekg ordered today demonstrates normal sinus rhythm, right bundle branch block, inferior Q waves are noted.  Rate of 74 beats per minute.this was unchanged from prior EKG in 2015.   Recent Labs: No results found for requested labs within last 365 days.    Lipid Panel    Component Value Date/Time   CHOL 150 04/04/2011 1343   TRIG 116 04/04/2011 1343   HDL 45 04/04/2011 1343   CHOLHDL 3.3 04/04/2011 1343   VLDL 23 04/04/2011 1343   LDLCALC 82 04/04/2011 1343      Wt Readings from Last 3 Encounters:  06/21/15 180 lb (81.647 kg)  05/14/14 180 lb 9.6 oz (81.92 kg)  01/14/14 182 lb 6.4 oz (82.736  kg)      ASSESSMENT AND PLAN:  1. CAD: Status post coronary artery bypass grafting in 2000.  He has had no significant symptoms.  Only recently has he experienced some chest pressure, but not associated with exertion.  He has not had any change in his energy level continues to hunt fish walk and play golf without any discomfort.  He notices some chest discomfort, which he describes as pressure, while he is  at rest at the end of the day.  It is transient.  I would like to proceed with a stress test to evaluate for progressive ischemia .Since his been 17 years since his bypass with known history of coronary artery disease., we will plan Cardiolite stress test, an echocardiogram to evaluate aortic valve murmur. I will check a BMET magnesium to evaluate kidney function, and magnesium level as he is on a PPI.  2. Hypertension: blood pressure is currently well controlled on current medication regimen,he will continue amlodipine, HCTZ, he is currently not on an ACE inhibitor.  May need to add this if there are changes in his LV function.  3. Hypercholesterolemia:he will continue on pravastatin 20 mg daily.  Primary care physician, is following labs.  Current medicines are reviewed at length with the patient today.    Labs/ tests ordered today include: BMET, magnesium, echocardiogram, and Cardiolite stress test.  Orders Placed This Encounter  Procedures  . Basic Metabolic Panel (BMET)  . Magnesium  . EKG 12-Lead  . ECHOCARDIOGRAM COMPLETE     Disposition:   FU with Dr.Branch one month Signed, Jory Sims, NP  06/21/2015 4:12 PM    [facility] [address], [place],  [address] Phone: [phone]; Fax: [phone]

## 2015-06-22 ENCOUNTER — Encounter: Payer: Self-pay | Admitting: Adult Health

## 2015-06-22 LAB — BASIC METABOLIC PANEL
BUN: 16 mg/dL (ref 7–25)
CALCIUM: 10.3 mg/dL (ref 8.6–10.3)
CO2: 29 mmol/L (ref 20–31)
Chloride: 98 mmol/L (ref 98–110)
Creat: 1.58 mg/dL — ABNORMAL HIGH (ref 0.70–1.25)
Glucose, Bld: 102 mg/dL — ABNORMAL HIGH (ref 65–99)
Potassium: 4.2 mmol/L (ref 3.5–5.3)
Sodium: 138 mmol/L (ref 135–146)

## 2015-06-22 LAB — MAGNESIUM: MAGNESIUM: 1.8 mg/dL (ref 1.5–2.5)

## 2015-06-23 ENCOUNTER — Ambulatory Visit (HOSPITAL_COMMUNITY)
Admission: RE | Admit: 2015-06-23 | Discharge: 2015-06-23 | Disposition: A | Discharge status: Home / Self Care | Payer: Medicare Other | Source: Ambulatory Visit | Attending: Adult Health | Admitting: Adult Health

## 2015-06-23 DIAGNOSIS — K219 Gastro-esophageal reflux disease without esophagitis: Secondary | ICD-10-CM | POA: Insufficient documentation

## 2015-06-23 DIAGNOSIS — I2510 Atherosclerotic heart disease of native coronary artery without angina pectoris: Secondary | ICD-10-CM | POA: Diagnosis not present

## 2015-06-23 DIAGNOSIS — E785 Hyperlipidemia, unspecified: Secondary | ICD-10-CM | POA: Insufficient documentation

## 2015-06-23 DIAGNOSIS — I351 Nonrheumatic aortic (valve) insufficiency: Secondary | ICD-10-CM | POA: Diagnosis not present

## 2015-06-23 DIAGNOSIS — Z951 Presence of aortocoronary bypass graft: Secondary | ICD-10-CM | POA: Diagnosis not present

## 2015-06-23 DIAGNOSIS — I10 Essential (primary) hypertension: Secondary | ICD-10-CM | POA: Insufficient documentation

## 2015-06-23 LAB — ECHOCARDIOGRAM COMPLETE
EWDT: 204 ms
FS: 34 % (ref 28–44)
IV/PV OW: 0.99
LA diam end sys: 40 cm
LV PW d: 12 mm — AB (ref 0.6–1.1)
LV SIMPSON'S DISK: 66
LVOT area: 3.14 cm2
MV Peak grad: 3 mmHg
MV pk A vel: 82.5 m/s
MV pk E vel: 82.9 m/s

## 2015-06-24 ENCOUNTER — Telehealth: Payer: Self-pay | Admitting: *Deleted

## 2015-06-24 NOTE — Telephone Encounter (Signed)
Called patient with test results. No answer. Left message to call back.  

## 2015-06-24 NOTE — Telephone Encounter (Signed)
-----   Message from Lendon Colonel, NP sent at 06/23/2015  4:45 PM EST ----- Reviewed results. No changes currently. Awaiting stress test.

## 2015-06-29 DIAGNOSIS — C44219 Basal cell carcinoma of skin of left ear and external auricular canal: Secondary | ICD-10-CM | POA: Diagnosis not present

## 2015-06-29 DIAGNOSIS — L570 Actinic keratosis: Secondary | ICD-10-CM | POA: Diagnosis not present

## 2015-06-30 ENCOUNTER — Encounter (HOSPITAL_COMMUNITY): Admission: RE | Admit: 2015-06-30 | Payer: Medicare Other | Source: Ambulatory Visit

## 2015-06-30 ENCOUNTER — Inpatient Hospital Stay (HOSPITAL_COMMUNITY): Admission: RE | Admit: 2015-06-30 | Payer: Medicare Other | Source: Ambulatory Visit

## 2015-06-30 ENCOUNTER — Encounter (HOSPITAL_COMMUNITY)
Admission: RE | Admit: 2015-06-30 | Discharge: 2015-06-30 | Disposition: A | Discharge status: Home / Self Care | Payer: Medicare Other | Source: Ambulatory Visit | Attending: Adult Health | Admitting: Adult Health

## 2015-06-30 DIAGNOSIS — R0789 Other chest pain: Secondary | ICD-10-CM

## 2015-07-01 ENCOUNTER — Encounter (HOSPITAL_COMMUNITY)
Admission: RE | Admit: 2015-07-01 | Discharge: 2015-07-01 | Disposition: A | Discharge status: Home / Self Care | Payer: Medicare Other | Source: Ambulatory Visit | Attending: Adult Health | Admitting: Adult Health

## 2015-07-01 ENCOUNTER — Encounter (HOSPITAL_COMMUNITY): Payer: Medicare Other

## 2015-07-01 ENCOUNTER — Encounter (HOSPITAL_COMMUNITY): Payer: Self-pay

## 2015-07-01 DIAGNOSIS — R0789 Other chest pain: Secondary | ICD-10-CM | POA: Diagnosis not present

## 2015-07-01 DIAGNOSIS — R079 Chest pain, unspecified: Secondary | ICD-10-CM | POA: Diagnosis not present

## 2015-07-01 LAB — NM MYOCAR MULTI W/SPECT W/WALL MOTION / EF
CHL CUP MPHR: 152 {beats}/min
CHL CUP NUCLEAR SDS: 1
CHL CUP NUCLEAR SSS: 1
CSEPHR: 93 %
Estimated workload: 10.1 METS
Exercise duration (min): 8 min
Exercise duration (sec): 25 s
LHR: 0.26
LV sys vol: 30 mL
LVDIAVOL: 80 mL (ref 62–150)
Peak HR: 142 {beats}/min
RPE: 15
Rest HR: 61 {beats}/min
SRS: 0
TID: 0.88

## 2015-07-01 MED ORDER — TECHNETIUM TC 99M SESTAMIBI GENERIC - CARDIOLITE
10.0000 | Freq: Once | INTRAVENOUS | Status: AC | PRN
  Administered 2015-07-01: 10 via INTRAVENOUS

## 2015-07-01 MED ORDER — SODIUM CHLORIDE 0.9% FLUSH
INTRAVENOUS | Status: AC
  Filled 2015-07-01: qty 180

## 2015-07-01 MED ORDER — TECHNETIUM TC 99M SESTAMIBI - CARDIOLITE
30.0000 | Freq: Once | INTRAVENOUS | Status: AC | PRN
  Administered 2015-07-01: 09:00:00 30 via INTRAVENOUS

## 2015-07-01 MED ORDER — REGADENOSON 0.4 MG/5ML IV SOLN
INTRAVENOUS | Status: AC
  Filled 2015-07-01: qty 5

## 2015-07-26 ENCOUNTER — Encounter: Payer: Self-pay | Admitting: Cardiology

## 2015-07-26 ENCOUNTER — Ambulatory Visit (INDEPENDENT_AMBULATORY_CARE_PROVIDER_SITE_OTHER): Payer: Medicare Other | Admitting: Cardiology

## 2015-07-26 VITALS — BP 148/80 | HR 61 | Ht 70.0 in | Wt 182.0 lb

## 2015-07-26 DIAGNOSIS — E785 Hyperlipidemia, unspecified: Secondary | ICD-10-CM

## 2015-07-26 DIAGNOSIS — I2510 Atherosclerotic heart disease of native coronary artery without angina pectoris: Secondary | ICD-10-CM | POA: Diagnosis not present

## 2015-07-26 DIAGNOSIS — I10 Essential (primary) hypertension: Secondary | ICD-10-CM | POA: Diagnosis not present

## 2015-07-26 NOTE — Progress Notes (Signed)
Patient ID: [name], male   DOB: 1945/06/17, 70 y.o.   MRN: [id number]     Clinical Summary [name] is a 70 y.o.male last seen by NP Purcell Nails, this is our first visit together.  1. CAD - CABG in 2000 - last visit with NP Lawerence reported episodes of chest pain - since last visit completed echo which showed normal LVEF. Exercise MPI he did 8 min 25 sec with no EKG changes and no ischemia by imaging.  - since last visit symptoms have resolved.  - has not been on ACE-I since prior admission with AKI, had also been on NSAIDs at that time.   2. HTN - checks at home at times. Typically 130s/70s - compliant with meds. He is taking 2.5 mg daily of norvasc only due to swelling.   3. Hyperlipidemia - leg pains on vytorin, tolerating pravastatin 10mg  daily.     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  Past Medical History  Diagnosis Date  . Arteriosclerotic cardiovascular disease (ASCVD)     CABG in 08/1998  . GERD (gastroesophageal reflux disease)     s/p espohageal dilatatin and repair of hiatal hernia  . Hypertension   . Hyperlipidemia   . BPH (benign prostatic hypertrophy)     h/x of prostatis/ UTI in 2006  . Sigmoid diverticulosis   . PONV (postoperative nausea and vomiting)   . Chronic kidney disease, stage II (mild) 2010    Creatinine of 1.5 in 2010  . Coronary artery disease   . IBS (irritable bowel syndrome)      Allergies  Allergen Reactions  . Codeine Nausea And Vomiting  . Demerol [Meperidine] Nausea And Vomiting  . Nalbuphine Nausea And Vomiting     Current Outpatient Prescriptions  Medication Sig Dispense Refill  . acetaminophen (TYLENOL) 325 MG tablet Take 650 mg by mouth every 6 (six) hours as needed for moderate pain.    Marland Kitchen amLODipine (NORVASC) 10 MG tablet Take 5 mg by mouth every morning.     Marland Kitchen aspirin 81 MG EC tablet Take 81 mg by mouth at bedtime.     . Coenzyme Q10 (CO Q 10) 100 MG CAPS Take 100 mg by mouth every morning.     . dicyclomine  (BENTYL) 20 MG tablet TAKE 1/2 TABLET BY MOUTH 3 TIMES A DAY BEFORE MEALS AS NEEDED FOR ABDOMINAL CRAMPS & DIARRHEA 90 tablet 5  . doxycycline (VIBRAMYCIN) 100 MG capsule Take 100 mg by mouth 2 (two) times daily.     . finasteride (PROSCAR) 5 MG tablet Take 5 mg by mouth every evening.    . hydrochlorothiazide (HYDRODIURIL) 25 MG tablet Take 25 mg by mouth every morning.    . hyoscyamine (LEVSIN SL) 0.125 MG SL tablet Place 1 tablet (0.125 mg total) under the tongue 4 (four) times daily. Before meals and at bedtime as needed for loose stools 120 tablet 3  . omeprazole (PRILOSEC) 20 MG capsule Take 20 mg by mouth 2 (two) times daily. Before LUNCH and Before SUPPER    . pravastatin (PRAVACHOL) 20 MG tablet Take 20 mg by mouth every evening.    . Probiotic Product (PROBIOTIC DAILY PO) Take by mouth.    . psyllium (METAMUCIL) 58.6 % powder Take 1 packet by mouth daily.    . sildenafil (VIAGRA) 50 MG tablet Take 50 mg by mouth daily as needed.    . solifenacin (VESICARE) 5 MG tablet Take 5 mg by mouth daily.    . sucralfate (CARAFATE) 1  GM/10ML suspension Take 10 mLs (1 g total) by mouth 4 (four) times daily. 420 mL 0  . tamsulosin (FLOMAX) 0.4 MG CAPS capsule Take 0.4 mg by mouth at bedtime.    . traMADol-acetaminophen (ULTRACET) 37.5-325 MG per tablet Take 1 tablet by mouth every 6 (six) hours as needed for moderate pain or severe pain (Muscle pain). 30 tablet 0   No current facility-administered medications for this visit.     Past Surgical History  Procedure Laterality Date  . Cholecystectomy  1990s  . Hiatal hernia repair  1995  . Lumbar laminectomy  1999  . Hemorrhoid surgery  2007    total of three  . Coronary artery bypass graft  2000  . Colonoscopy  03/17/2012    Rourk; colonic diverticulosis, repeat screening colonoscopy in 10 years.  . Esophagogastroduodenoscopy  May 2009    Rourk: Food impaction, critical esophageal ring/stricture requiring dilation  . Breast biopsy Right  08/17/2013    Procedure: RIGHT BREAST BIOPSY;  Surgeon: [name], MD;  Location: [facility];  Service: General;  Laterality: Right;     Allergies  Allergen Reactions  . Codeine Nausea And Vomiting  . Demerol [Meperidine] Nausea And Vomiting  . Nalbuphine Nausea And Vomiting      Family History  Problem Relation Age of Onset  . Colon cancer Neg Hx   . Coronary artery disease Father     also grandfather     Social History [name] reports that he has never smoked. He has never used smokeless tobacco. [name] reports that he drinks about 1.0 oz of alcohol per week.   Review of Systems CONSTITUTIONAL: No weight loss, fever, chills, weakness or fatigue.  HEENT: Eyes: No visual loss, blurred vision, double vision or yellow sclerae.No hearing loss, sneezing, congestion, runny nose or sore throat.  SKIN: No rash or itching.  CARDIOVASCULAR: per HPI RESPIRATORY: No shortness of breath, cough or sputum.  GASTROINTESTINAL: No anorexia, nausea, vomiting or diarrhea. No abdominal pain or blood.  GENITOURINARY: No burning on urination, no polyuria NEUROLOGICAL: No headache, dizziness, syncope, paralysis, ataxia, numbness or tingling in the extremities. No change in bowel or bladder control.  MUSCULOSKELETAL: No muscle, back pain, joint pain or stiffness.  LYMPHATICS: No enlarged nodes. No history of splenectomy.  PSYCHIATRIC: No history of depression or anxiety.  ENDOCRINOLOGIC: No reports of sweating, cold or heat intolerance. No polyuria or polydipsia.  Marland Kitchen   Physical Examination Filed Vitals:   07/26/15 0902  BP: 148/80  Pulse: 61   Filed Vitals:   07/26/15 0902  Height: 5\' 10"  (1.778 m)  Weight: 182 lb (82.555 kg)    Gen: resting comfortably, no acute distress HEENT: no scleral icterus, pupils equal round and reactive, no palptable cervical adenopathy,  CV: RRR, no m/r/g, no jvd Resp: Clear to auscultation bilaterally GI: abdomen is soft, non-tender, non-distended,  normal bowel sounds, no hepatosplenomegaly MSK: extremities are warm, no edema.  Skin: warm, no rash Neuro:  no focal deficits Psych: appropriate affect   Diagnostic Studies 06/2015 echo Study Conclusions  - Left ventricle: The cavity size was normal. Wall thickness was  increased increased in a pattern of mild to moderate LVH.  Systolic function was normal. The estimated ejection fraction was  in the range of 60% to 65%. Wall motion was normal; there were no  regional wall motion abnormalities. Left ventricular diastolic  function parameters were normal. - Aortic valve: Mildly calcified annulus. Trileaflet; mildly  thickened leaflets. Valve area (VTI):  2.58 cm^2. Valve area  (Vmax): 2.89 cm^2. - Mitral valve: There was mild regurgitation. - Systemic veins: Small IVC, suggesting low RA pressures and  hypovolemia. - Technically adequate study.   06/2015 Exercise Stress MPI  Blood pressure demonstrated a hypertensive response to exercise.  The study is normal.  This is a low risk study.  The left ventricular ejection fraction is normal (55-65%).  Assessment and Plan  1. CAD - no recent symptoms, continue current meds  2. HTN - home numbers at goal despite being elevated in clinic - continue current meds  3. Hyperlipidemia - did not tolerate more potent statin, we will continue pravastatin      Arnoldo Lenis, M.D.

## 2015-07-26 NOTE — Patient Instructions (Signed)

## 2015-09-27 DIAGNOSIS — L570 Actinic keratosis: Secondary | ICD-10-CM | POA: Diagnosis not present

## 2015-10-10 DIAGNOSIS — E663 Overweight: Secondary | ICD-10-CM | POA: Diagnosis not present

## 2015-10-10 DIAGNOSIS — Z6825 Body mass index (BMI) 25.0-25.9, adult: Secondary | ICD-10-CM | POA: Diagnosis not present

## 2015-10-10 DIAGNOSIS — R7309 Other abnormal glucose: Secondary | ICD-10-CM | POA: Diagnosis not present

## 2015-10-10 DIAGNOSIS — Z1389 Encounter for screening for other disorder: Secondary | ICD-10-CM | POA: Diagnosis not present

## 2015-10-10 DIAGNOSIS — N3281 Overactive bladder: Secondary | ICD-10-CM | POA: Diagnosis not present

## 2015-11-29 ENCOUNTER — Encounter: Payer: Self-pay | Admitting: Cardiology

## 2015-12-29 DIAGNOSIS — E782 Mixed hyperlipidemia: Secondary | ICD-10-CM | POA: Diagnosis not present

## 2015-12-29 DIAGNOSIS — N179 Acute kidney failure, unspecified: Secondary | ICD-10-CM | POA: Diagnosis not present

## 2015-12-29 DIAGNOSIS — Z6825 Body mass index (BMI) 25.0-25.9, adult: Secondary | ICD-10-CM | POA: Diagnosis not present

## 2015-12-29 DIAGNOSIS — N183 Chronic kidney disease, stage 3 (moderate): Secondary | ICD-10-CM | POA: Diagnosis not present

## 2015-12-29 DIAGNOSIS — Z1389 Encounter for screening for other disorder: Secondary | ICD-10-CM | POA: Diagnosis not present

## 2015-12-29 DIAGNOSIS — I2510 Atherosclerotic heart disease of native coronary artery without angina pectoris: Secondary | ICD-10-CM | POA: Diagnosis not present

## 2015-12-29 DIAGNOSIS — I10 Essential (primary) hypertension: Secondary | ICD-10-CM | POA: Diagnosis not present

## 2015-12-30 ENCOUNTER — Ambulatory Visit: Payer: Medicare Other | Admitting: Cardiology

## 2016-01-06 ENCOUNTER — Ambulatory Visit (INDEPENDENT_AMBULATORY_CARE_PROVIDER_SITE_OTHER): Payer: Medicare Other | Admitting: Cardiology

## 2016-01-06 ENCOUNTER — Encounter: Payer: Self-pay | Admitting: Cardiology

## 2016-01-06 VITALS — BP 136/80 | HR 71 | Ht 70.0 in | Wt 171.0 lb

## 2016-01-06 DIAGNOSIS — I2510 Atherosclerotic heart disease of native coronary artery without angina pectoris: Secondary | ICD-10-CM

## 2016-01-06 DIAGNOSIS — E785 Hyperlipidemia, unspecified: Secondary | ICD-10-CM

## 2016-01-06 DIAGNOSIS — I10 Essential (primary) hypertension: Secondary | ICD-10-CM

## 2016-01-06 NOTE — Patient Instructions (Signed)

## 2016-01-06 NOTE — Progress Notes (Signed)
Clinical Summary [name] is a 70 y.o.male  seen today for follow up of the following medical problems.   1. CAD - CABG in 2000 - last visit with NP [name] reported episodes of chest pain - 06/2015 completed echo which showed normal LVEF. Exercise MPI he did 8 min 25 sec with no EKG changes and no ischemia by imaging.  - has not been on ACE-I since prior admission with AKI, had also been on NSAIDs at that time.   - no recent chest pain. No SOB or DOE - compliant with meds.    2. HTN - checks at home at times. Typically 120s-130s/70-80s - compliant with meds. He is taking 2.5 mg daily of norvasc only due to swelling. He prefers to cut his 10mg  tablet into quarters.   3. Hyperlipidemia - leg pains on vytorin, tolerating pravastatin 10mg  daily. At 20mg  of pravastatin got leg pains.     Past Medical History:  Diagnosis Date  . Arteriosclerotic cardiovascular disease (ASCVD)    CABG in 08/1998  . BPH (benign prostatic hypertrophy)    h/x of prostatis/ UTI in 2006  . Chronic kidney disease, stage II (mild) 2010   Creatinine of 1.5 in 2010  . Coronary artery disease   . GERD (gastroesophageal reflux disease)    s/p espohageal dilatatin and repair of hiatal hernia  . Hyperlipidemia   . Hypertension   . IBS (irritable bowel syndrome)   . PONV (postoperative nausea and vomiting)   . Sigmoid diverticulosis      Allergies  Allergen Reactions  . Codeine Nausea And Vomiting  . Demerol [Meperidine] Nausea And Vomiting  . Nalbuphine Nausea And Vomiting     Current Outpatient Prescriptions  Medication Sig Dispense Refill  . acetaminophen (TYLENOL) 325 MG tablet Take 650 mg by mouth every 6 (six) hours as needed for moderate pain.    Marland Kitchen amLODipine (NORVASC) 10 MG tablet Take 5 mg by mouth every morning.     Marland Kitchen aspirin 81 MG EC tablet Take 81 mg by mouth at bedtime.     . Coenzyme Q10 (CO Q 10) 100 MG CAPS Take 100 mg by mouth every morning.     . dicyclomine (BENTYL) 20  MG tablet TAKE 1/2 TABLET BY MOUTH 3 TIMES A DAY BEFORE MEALS AS NEEDED FOR ABDOMINAL CRAMPS & DIARRHEA 90 tablet 5  . finasteride (PROSCAR) 5 MG tablet Take 5 mg by mouth every evening.    . hydrochlorothiazide (HYDRODIURIL) 25 MG tablet Take 25 mg by mouth every morning.    . hyoscyamine (LEVSIN SL) 0.125 MG SL tablet Place 1 tablet (0.125 mg total) under the tongue 4 (four) times daily. Before meals and at bedtime as needed for loose stools 120 tablet 3  . omeprazole (PRILOSEC) 20 MG capsule Take 20 mg by mouth 2 (two) times daily. Before LUNCH and Before SUPPER    . pravastatin (PRAVACHOL) 20 MG tablet Take 20 mg by mouth every evening.    . Probiotic Product (PROBIOTIC DAILY PO) Take by mouth.    . psyllium (METAMUCIL) 58.6 % powder Take 1 packet by mouth daily.    . tamsulosin (FLOMAX) 0.4 MG CAPS capsule Take 0.4 mg by mouth at bedtime.     No current facility-administered medications for this visit.      Past Surgical History:  Procedure Laterality Date  . BREAST BIOPSY Right 08/17/2013   Procedure: RIGHT BREAST BIOPSY;  Surgeon: Michael So, MD;  Location: [facility];  Service: General;  Laterality: Right;  . CHOLECYSTECTOMY  1990s  . COLONOSCOPY  03/17/2012   [name]; colonic diverticulosis, repeat screening colonoscopy in 10 years.  . CORONARY ARTERY BYPASS GRAFT  2000  . ESOPHAGOGASTRODUODENOSCOPY  May 2009   [name]: Food impaction, critical esophageal ring/stricture requiring dilation  . HEMORRHOID SURGERY  2007   total of three  . HIATAL HERNIA REPAIR  1995  . LUMBAR LAMINECTOMY  1999     Allergies  Allergen Reactions  . Codeine Nausea And Vomiting  . Demerol [Meperidine] Nausea And Vomiting  . Nalbuphine Nausea And Vomiting      Family History  Problem Relation Age of Onset  . Colon cancer Neg Hx   . Coronary artery disease Father     also grandfather     Social History [name] reports that he has never smoked. He has never used smokeless tobacco. [name]  reports that he drinks about 1.0 oz of alcohol per week .   Review of Systems CONSTITUTIONAL: No weight loss, fever, chills, weakness or fatigue.  HEENT: Eyes: No visual loss, blurred vision, double vision or yellow sclerae.No hearing loss, sneezing, congestion, runny nose or sore throat.  SKIN: No rash or itching.  CARDIOVASCULAR: per HPI RESPIRATORY: No shortness of breath, cough or sputum.  GASTROINTESTINAL: No anorexia, nausea, vomiting or diarrhea. No abdominal pain or blood.  GENITOURINARY: No burning on urination, no polyuria NEUROLOGICAL: No headache, dizziness, syncope, paralysis, ataxia, numbness or tingling in the extremities. No change in bowel or bladder control.  MUSCULOSKELETAL: No muscle, back pain, joint pain or stiffness.  LYMPHATICS: No enlarged nodes. No history of splenectomy.  PSYCHIATRIC: No history of depression or anxiety.  ENDOCRINOLOGIC: No reports of sweating, cold or heat intolerance. No polyuria or polydipsia.  Marland Kitchen   Physical Examination Vitals:   01/06/16 1301  BP: 136/80  Pulse: 71   Vitals:   01/06/16 1301  Weight: 171 lb (77.6 kg)  Height: 5\' 10"  (1.778 m)    Gen: resting comfortably, no acute distress HEENT: no scleral icterus, pupils equal round and reactive, no palptable cervical adenopathy,  CV: RRR, no m/r/g, no jvd Resp: Clear to auscultation bilaterally GI: abdomen is soft, non-tender, non-distended, normal bowel sounds, no hepatosplenomegaly MSK: extremities are warm, no edema.  Skin: warm, no rash Neuro:  no focal deficits Psych: appropriate affect   Diagnostic Studies 06/2015 echo Study Conclusions  - Left ventricle: The cavity size was normal. Wall thickness was  increased increased in a pattern of mild to moderate LVH.  Systolic function was normal. The estimated ejection fraction was  in the range of 60% to 65%. Wall motion was normal; there were no  regional wall motion abnormalities. Left ventricular diastolic   function parameters were normal. - Aortic valve: Mildly calcified annulus. Trileaflet; mildly  thickened leaflets. Valve area (VTI): 2.58 cm^2. Valve area  (Vmax): 2.89 cm^2. - Mitral valve: There was mild regurgitation. - Systemic veins: Small IVC, suggesting low RA pressures and  hypovolemia. - Technically adequate study.   06/2015 Exercise Stress MPI  Blood pressure demonstrated a hypertensive response to exercise.  The study is normal.  This is a low risk study.  The left ventricular ejection fraction is normal (55-65%).    Assessment and Plan  1. CAD - no recent symptoms - continue current meds  2. HTN -bp at goal - continue current meds  3. Hyperlipidemia - did not tolerate more potent statin - continue pravastatin 10mg  daily.  F/u 6 months  Arnoldo Lenis, M.D.

## 2016-01-13 DIAGNOSIS — N401 Enlarged prostate with lower urinary tract symptoms: Secondary | ICD-10-CM | POA: Diagnosis not present

## 2016-01-17 ENCOUNTER — Ambulatory Visit (INDEPENDENT_AMBULATORY_CARE_PROVIDER_SITE_OTHER): Payer: Medicare Other | Admitting: Urology

## 2016-01-17 DIAGNOSIS — R972 Elevated prostate specific antigen [PSA]: Secondary | ICD-10-CM | POA: Diagnosis not present

## 2016-01-17 DIAGNOSIS — N5201 Erectile dysfunction due to arterial insufficiency: Secondary | ICD-10-CM | POA: Diagnosis not present

## 2016-01-17 DIAGNOSIS — N401 Enlarged prostate with lower urinary tract symptoms: Secondary | ICD-10-CM

## 2016-03-12 DIAGNOSIS — M5442 Lumbago with sciatica, left side: Secondary | ICD-10-CM | POA: Diagnosis not present

## 2016-03-12 DIAGNOSIS — M9903 Segmental and somatic dysfunction of lumbar region: Secondary | ICD-10-CM | POA: Diagnosis not present

## 2016-03-12 DIAGNOSIS — M47816 Spondylosis without myelopathy or radiculopathy, lumbar region: Secondary | ICD-10-CM | POA: Diagnosis not present

## 2016-03-13 DIAGNOSIS — M9903 Segmental and somatic dysfunction of lumbar region: Secondary | ICD-10-CM | POA: Diagnosis not present

## 2016-03-13 DIAGNOSIS — M5442 Lumbago with sciatica, left side: Secondary | ICD-10-CM | POA: Diagnosis not present

## 2016-03-13 DIAGNOSIS — M47816 Spondylosis without myelopathy or radiculopathy, lumbar region: Secondary | ICD-10-CM | POA: Diagnosis not present

## 2016-03-15 DIAGNOSIS — M9903 Segmental and somatic dysfunction of lumbar region: Secondary | ICD-10-CM | POA: Diagnosis not present

## 2016-03-15 DIAGNOSIS — M5442 Lumbago with sciatica, left side: Secondary | ICD-10-CM | POA: Diagnosis not present

## 2016-03-15 DIAGNOSIS — M47816 Spondylosis without myelopathy or radiculopathy, lumbar region: Secondary | ICD-10-CM | POA: Diagnosis not present

## 2016-04-05 DIAGNOSIS — M7660 Achilles tendinitis, unspecified leg: Secondary | ICD-10-CM | POA: Diagnosis not present

## 2016-04-05 DIAGNOSIS — I2510 Atherosclerotic heart disease of native coronary artery without angina pectoris: Secondary | ICD-10-CM | POA: Diagnosis not present

## 2016-04-05 DIAGNOSIS — I129 Hypertensive chronic kidney disease with stage 1 through stage 4 chronic kidney disease, or unspecified chronic kidney disease: Secondary | ICD-10-CM | POA: Diagnosis not present

## 2016-04-05 DIAGNOSIS — Z6826 Body mass index (BMI) 26.0-26.9, adult: Secondary | ICD-10-CM | POA: Diagnosis not present

## 2016-04-05 DIAGNOSIS — E663 Overweight: Secondary | ICD-10-CM | POA: Diagnosis not present

## 2016-04-05 DIAGNOSIS — Z1389 Encounter for screening for other disorder: Secondary | ICD-10-CM | POA: Diagnosis not present

## 2016-05-25 DIAGNOSIS — E663 Overweight: Secondary | ICD-10-CM | POA: Diagnosis not present

## 2016-05-25 DIAGNOSIS — Z6826 Body mass index (BMI) 26.0-26.9, adult: Secondary | ICD-10-CM | POA: Diagnosis not present

## 2016-05-25 DIAGNOSIS — Z1389 Encounter for screening for other disorder: Secondary | ICD-10-CM | POA: Diagnosis not present

## 2016-05-25 DIAGNOSIS — M791 Myalgia: Secondary | ICD-10-CM | POA: Diagnosis not present

## 2016-05-25 DIAGNOSIS — Z888 Allergy status to other drugs, medicaments and biological substances status: Secondary | ICD-10-CM | POA: Diagnosis not present

## 2016-05-29 DIAGNOSIS — H04123 Dry eye syndrome of bilateral lacrimal glands: Secondary | ICD-10-CM | POA: Diagnosis not present

## 2016-05-29 DIAGNOSIS — H02105 Unspecified ectropion of left lower eyelid: Secondary | ICD-10-CM | POA: Diagnosis not present

## 2016-05-29 DIAGNOSIS — H10502 Unspecified blepharoconjunctivitis, left eye: Secondary | ICD-10-CM | POA: Diagnosis not present

## 2016-06-08 ENCOUNTER — Other Ambulatory Visit: Payer: Self-pay | Admitting: Physician Assistant

## 2016-06-08 DIAGNOSIS — C44219 Basal cell carcinoma of skin of left ear and external auricular canal: Secondary | ICD-10-CM | POA: Diagnosis not present

## 2016-06-08 DIAGNOSIS — C44729 Squamous cell carcinoma of skin of left lower limb, including hip: Secondary | ICD-10-CM | POA: Diagnosis not present

## 2016-06-08 DIAGNOSIS — D0439 Carcinoma in situ of skin of other parts of face: Secondary | ICD-10-CM | POA: Diagnosis not present

## 2016-06-08 DIAGNOSIS — D044 Carcinoma in situ of skin of scalp and neck: Secondary | ICD-10-CM | POA: Diagnosis not present

## 2016-07-05 DIAGNOSIS — M66361 Spontaneous rupture of flexor tendons, right lower leg: Secondary | ICD-10-CM | POA: Diagnosis not present

## 2016-07-05 DIAGNOSIS — M79671 Pain in right foot: Secondary | ICD-10-CM | POA: Diagnosis not present

## 2016-07-10 DIAGNOSIS — M25571 Pain in right ankle and joints of right foot: Secondary | ICD-10-CM | POA: Diagnosis not present

## 2016-07-10 DIAGNOSIS — S86011A Strain of right Achilles tendon, initial encounter: Secondary | ICD-10-CM | POA: Diagnosis not present

## 2016-07-12 DIAGNOSIS — M7661 Achilles tendinitis, right leg: Secondary | ICD-10-CM | POA: Diagnosis not present

## 2016-07-12 DIAGNOSIS — M79671 Pain in right foot: Secondary | ICD-10-CM | POA: Diagnosis not present

## 2016-07-23 ENCOUNTER — Encounter: Payer: Self-pay | Admitting: *Deleted

## 2016-07-23 ENCOUNTER — Encounter: Payer: Self-pay | Admitting: Cardiology

## 2016-07-23 ENCOUNTER — Ambulatory Visit (INDEPENDENT_AMBULATORY_CARE_PROVIDER_SITE_OTHER): Payer: Medicare Other | Admitting: Cardiology

## 2016-07-23 VITALS — BP 150/86 | HR 69 | Ht 70.0 in | Wt 183.0 lb

## 2016-07-23 DIAGNOSIS — I2510 Atherosclerotic heart disease of native coronary artery without angina pectoris: Secondary | ICD-10-CM | POA: Diagnosis not present

## 2016-07-23 DIAGNOSIS — E782 Mixed hyperlipidemia: Secondary | ICD-10-CM

## 2016-07-23 DIAGNOSIS — I10 Essential (primary) hypertension: Secondary | ICD-10-CM

## 2016-07-23 NOTE — Progress Notes (Signed)
Clinical Summary [name] is a 71 y.o.male seen today for follow up of the following medical problems.   1. CAD - CABG in 2000 - last visit with NP Lawerence reported episodes of chest pain - 06/2015 completed echo which showed normal LVEF. Exercise MPI he did 8 min 25 sec with no EKG changes and no ischemia by imaging.  - has not been on ACE-I since prior admission with AKI, had also been on NSAIDs at that time.    - no chest pain, no SOB - compliant with meds   2. HTN - compliant with meds. Taking norvasc 5mg , tolerating at this time without swelling.  - home numbers are at goal  3. Hyperlipidemia - leg pains on vytorin. Did not tolerate low dose pravastatin.  - he enrolled in clinical trial for cholesterol medication. Bempedoic acid trial.   4. Partially torn achilles - conservative management at this time, followed by ortho     Past Medical History:  Diagnosis Date  . Arteriosclerotic cardiovascular disease (ASCVD)    CABG in 08/1998  . BPH (benign prostatic hypertrophy)    h/x of prostatis/ UTI in 2006  . Chronic kidney disease, stage II (mild) 2010   Creatinine of 1.5 in 2010  . Coronary artery disease   . GERD (gastroesophageal reflux disease)    s/p espohageal dilatatin and repair of hiatal hernia  . Hyperlipidemia   . Hypertension   . IBS (irritable bowel syndrome)   . PONV (postoperative nausea and vomiting)   . Sigmoid diverticulosis      Allergies  Allergen Reactions  . Codeine Nausea And Vomiting  . Demerol [Meperidine] Nausea And Vomiting  . Nalbuphine Nausea And Vomiting     Current Outpatient Prescriptions  Medication Sig Dispense Refill  . acetaminophen (TYLENOL) 325 MG tablet Take 650 mg by mouth every 6 (six) hours as needed for moderate pain.    Marland Kitchen amLODipine (NORVASC) 10 MG tablet Take 5 mg by mouth every morning.     Marland Kitchen aspirin 81 MG EC tablet Take 81 mg by mouth at bedtime.     . Coenzyme Q10 (CO Q 10) 100 MG CAPS Take 100  mg by mouth every morning.     . finasteride (PROSCAR) 5 MG tablet Take 5 mg by mouth every evening.    . hydrochlorothiazide (HYDRODIURIL) 25 MG tablet Take 25 mg by mouth every morning.    . hyoscyamine (LEVSIN SL) 0.125 MG SL tablet Place 1 tablet (0.125 mg total) under the tongue 4 (four) times daily. Before meals and at bedtime as needed for loose stools 120 tablet 3  . omeprazole (PRILOSEC) 20 MG capsule Take 20 mg by mouth 2 (two) times daily. Before LUNCH and Before SUPPER    . pravastatin (PRAVACHOL) 20 MG tablet Take 20 mg by mouth every evening.    . Probiotic Product (PROBIOTIC DAILY PO) Take by mouth.    . psyllium (METAMUCIL) 58.6 % powder Take 1 packet by mouth daily.    . tamsulosin (FLOMAX) 0.4 MG CAPS capsule Take 0.4 mg by mouth at bedtime.     No current facility-administered medications for this visit.      Past Surgical History:  Procedure Laterality Date  . BREAST BIOPSY Right 08/17/2013   Procedure: RIGHT BREAST BIOPSY;  Surgeon: [name], MD;  Location: [facility];  Service: General;  Laterality: Right;  . CHOLECYSTECTOMY  1990s  . COLONOSCOPY  03/17/2012   Rourk; colonic diverticulosis, repeat  screening colonoscopy in 10 years.  . CORONARY ARTERY BYPASS GRAFT  2000  . ESOPHAGOGASTRODUODENOSCOPY  May 2009   Rourk: Food impaction, critical esophageal ring/stricture requiring dilation  . HEMORRHOID SURGERY  2007   total of three  . HIATAL HERNIA REPAIR  1995  . LUMBAR LAMINECTOMY  1999     Allergies  Allergen Reactions  . Codeine Nausea And Vomiting  . Demerol [Meperidine] Nausea And Vomiting  . Nalbuphine Nausea And Vomiting      Family History  Problem Relation Age of Onset  . Coronary artery disease Father     also grandfather  . Colon cancer Neg Hx      Social History [name] reports that he has never smoked. He has never used smokeless tobacco. [name] reports that he drinks about 1.0 oz of alcohol per week .   Review of  Systems CONSTITUTIONAL: No weight loss, fever, chills, weakness or fatigue.  HEENT: Eyes: No visual loss, blurred vision, double vision or yellow sclerae.No hearing loss, sneezing, congestion, runny nose or sore throat.  SKIN: No rash or itching.  CARDIOVASCULAR: per HPI RESPIRATORY: No shortness of breath, cough or sputum.  GASTROINTESTINAL: No anorexia, nausea, vomiting or diarrhea. No abdominal pain or blood.  GENITOURINARY: No burning on urination, no polyuria NEUROLOGICAL: No headache, dizziness, syncope, paralysis, ataxia, numbness or tingling in the extremities. No change in bowel or bladder control.  MUSCULOSKELETAL: No muscle, back pain, joint pain or stiffness.  LYMPHATICS: No enlarged nodes. No history of splenectomy.  PSYCHIATRIC: No history of depression or anxiety.  ENDOCRINOLOGIC: No reports of sweating, cold or heat intolerance. No polyuria or polydipsia.  Marland Kitchen   Physical Examination Vitals:   07/23/16 0849  BP: (!) 150/86  Pulse: 69   Vitals:   07/23/16 0849  Weight: 183 lb (83 kg)  Height: 5\' 10"  (1.778 m)    Gen: resting comfortably, no acute distress HEENT: no scleral icterus, pupils equal round and reactive, no palptable cervical adenopathy,  CV: RRR, no m/r/g, no jvd Resp: Clear to auscultation bilaterally GI: abdomen is soft, non-tender, non-distended, normal bowel sounds, no hepatosplenomegaly MSK: extremities are warm, no edema.  Skin: warm, no rash Neuro:  no focal deficits Psych: appropriate affect   Diagnostic Studies 06/2015 echo Study Conclusions  - Left ventricle: The cavity size was normal. Wall thickness was  increased increased in a pattern of mild to moderate LVH.  Systolic function was normal. The estimated ejection fraction was  in the range of 60% to 65%. Wall motion was normal; there were no  regional wall motion abnormalities. Left ventricular diastolic  function parameters were normal. - Aortic valve: Mildly calcified  annulus. Trileaflet; mildly  thickened leaflets. Valve area (VTI): 2.58 cm^2. Valve area  (Vmax): 2.89 cm^2. - Mitral valve: There was mild regurgitation. - Systemic veins: Small IVC, suggesting low RA pressures and  hypovolemia. - Technically adequate study.   06/2015 Exercise Stress MPI  Blood pressure demonstrated a hypertensive response to exercise.  The study is normal.  This is a low risk study.  The left ventricular ejection fraction is normal (55-65%).    Assessment and Plan  1. CAD - denies any recent symptoms. EKG in clinic shows SR, RBBB, no acute ischemic changes - continue current meds  2. HTN - elevated in clinic, home numbers have been at goal.  - continue current meds  3. Hyperlipidemia - has not tolerated statins - currently enrolled in nonstatin cholesterol medication trial   F/u  6 months. Request labs from pcp      Arnoldo Lenis, M.D.

## 2016-07-23 NOTE — Patient Instructions (Signed)
Your physician wants you to follow-up in: 6 Months with Dr. Branch. You will receive a reminder letter in the mail two months in advance. If you don't receive a letter, please call our office to schedule the follow-up appointment.  Your physician recommends that you continue on your current medications as directed. Please refer to the Current Medication list given to you today.  If you need a refill on your cardiac medications before your next appointment, please call your pharmacy.  Thank you for choosing [facility]!   

## 2016-08-09 DIAGNOSIS — M7661 Achilles tendinitis, right leg: Secondary | ICD-10-CM | POA: Diagnosis not present

## 2016-08-09 DIAGNOSIS — M79671 Pain in right foot: Secondary | ICD-10-CM | POA: Diagnosis not present

## 2016-08-13 DIAGNOSIS — K219 Gastro-esophageal reflux disease without esophagitis: Secondary | ICD-10-CM | POA: Diagnosis not present

## 2016-08-13 DIAGNOSIS — I10 Essential (primary) hypertension: Secondary | ICD-10-CM | POA: Diagnosis not present

## 2016-08-13 DIAGNOSIS — I2510 Atherosclerotic heart disease of native coronary artery without angina pectoris: Secondary | ICD-10-CM | POA: Diagnosis not present

## 2016-08-13 DIAGNOSIS — E663 Overweight: Secondary | ICD-10-CM | POA: Diagnosis not present

## 2016-08-13 DIAGNOSIS — K589 Irritable bowel syndrome without diarrhea: Secondary | ICD-10-CM | POA: Diagnosis not present

## 2016-08-13 DIAGNOSIS — Z1389 Encounter for screening for other disorder: Secondary | ICD-10-CM | POA: Diagnosis not present

## 2016-08-13 DIAGNOSIS — Z6826 Body mass index (BMI) 26.0-26.9, adult: Secondary | ICD-10-CM | POA: Diagnosis not present

## 2016-08-13 DIAGNOSIS — E782 Mixed hyperlipidemia: Secondary | ICD-10-CM | POA: Diagnosis not present

## 2016-08-14 ENCOUNTER — Other Ambulatory Visit: Payer: Self-pay | Admitting: Physician Assistant

## 2016-08-14 DIAGNOSIS — Z1389 Encounter for screening for other disorder: Secondary | ICD-10-CM | POA: Diagnosis not present

## 2016-08-14 DIAGNOSIS — D492 Neoplasm of unspecified behavior of bone, soft tissue, and skin: Secondary | ICD-10-CM | POA: Diagnosis not present

## 2016-08-14 DIAGNOSIS — L570 Actinic keratosis: Secondary | ICD-10-CM | POA: Diagnosis not present

## 2016-08-14 DIAGNOSIS — D229 Melanocytic nevi, unspecified: Secondary | ICD-10-CM | POA: Diagnosis not present

## 2016-08-14 DIAGNOSIS — E663 Overweight: Secondary | ICD-10-CM | POA: Diagnosis not present

## 2016-08-14 DIAGNOSIS — Z6826 Body mass index (BMI) 26.0-26.9, adult: Secondary | ICD-10-CM | POA: Diagnosis not present

## 2016-08-21 DIAGNOSIS — H4313 Vitreous hemorrhage, bilateral: Secondary | ICD-10-CM | POA: Diagnosis not present

## 2016-08-21 DIAGNOSIS — H43393 Other vitreous opacities, bilateral: Secondary | ICD-10-CM | POA: Diagnosis not present

## 2016-08-22 ENCOUNTER — Emergency Department (HOSPITAL_COMMUNITY): Payer: Medicare Other

## 2016-08-22 ENCOUNTER — Encounter (HOSPITAL_COMMUNITY): Payer: Self-pay | Admitting: Emergency Medicine

## 2016-08-22 ENCOUNTER — Inpatient Hospital Stay (HOSPITAL_COMMUNITY)
Admission: EM | Admit: 2016-08-22 | Discharge: 2016-08-23 | DRG: 282 | Disposition: A | Discharge status: Home / Self Care | Payer: Medicare Other | Attending: Cardiology | Admitting: Cardiology

## 2016-08-22 DIAGNOSIS — Z79899 Other long term (current) drug therapy: Secondary | ICD-10-CM

## 2016-08-22 DIAGNOSIS — Z888 Allergy status to other drugs, medicaments and biological substances status: Secondary | ICD-10-CM

## 2016-08-22 DIAGNOSIS — I2510 Atherosclerotic heart disease of native coronary artery without angina pectoris: Secondary | ICD-10-CM | POA: Diagnosis present

## 2016-08-22 DIAGNOSIS — Z951 Presence of aortocoronary bypass graft: Secondary | ICD-10-CM | POA: Diagnosis not present

## 2016-08-22 DIAGNOSIS — N183 Chronic kidney disease, stage 3 (moderate): Secondary | ICD-10-CM | POA: Diagnosis present

## 2016-08-22 DIAGNOSIS — Z7983 Long term (current) use of bisphosphonates: Secondary | ICD-10-CM

## 2016-08-22 DIAGNOSIS — K219 Gastro-esophageal reflux disease without esophagitis: Secondary | ICD-10-CM | POA: Diagnosis present

## 2016-08-22 DIAGNOSIS — E785 Hyperlipidemia, unspecified: Secondary | ICD-10-CM | POA: Diagnosis present

## 2016-08-22 DIAGNOSIS — Z8249 Family history of ischemic heart disease and other diseases of the circulatory system: Secondary | ICD-10-CM

## 2016-08-22 DIAGNOSIS — Z006 Encounter for examination for normal comparison and control in clinical research program: Secondary | ICD-10-CM

## 2016-08-22 DIAGNOSIS — N400 Enlarged prostate without lower urinary tract symptoms: Secondary | ICD-10-CM | POA: Diagnosis not present

## 2016-08-22 DIAGNOSIS — Z885 Allergy status to narcotic agent status: Secondary | ICD-10-CM | POA: Diagnosis not present

## 2016-08-22 DIAGNOSIS — K589 Irritable bowel syndrome without diarrhea: Secondary | ICD-10-CM | POA: Diagnosis present

## 2016-08-22 DIAGNOSIS — I129 Hypertensive chronic kidney disease with stage 1 through stage 4 chronic kidney disease, or unspecified chronic kidney disease: Secondary | ICD-10-CM | POA: Diagnosis not present

## 2016-08-22 DIAGNOSIS — Z7982 Long term (current) use of aspirin: Secondary | ICD-10-CM

## 2016-08-22 DIAGNOSIS — I10 Essential (primary) hypertension: Secondary | ICD-10-CM | POA: Diagnosis not present

## 2016-08-22 DIAGNOSIS — I214 Non-ST elevation (NSTEMI) myocardial infarction: Secondary | ICD-10-CM | POA: Diagnosis not present

## 2016-08-22 DIAGNOSIS — R079 Chest pain, unspecified: Secondary | ICD-10-CM | POA: Diagnosis not present

## 2016-08-22 LAB — DIFFERENTIAL
Basophils Absolute: 0 10*3/uL (ref 0.0–0.1)
Basophils Relative: 0 %
EOS PCT: 1 %
Eosinophils Absolute: 0.1 10*3/uL (ref 0.0–0.7)
LYMPHS ABS: 1.5 10*3/uL (ref 0.7–4.0)
LYMPHS PCT: 18 %
MONO ABS: 0.5 10*3/uL (ref 0.1–1.0)
MONOS PCT: 6 %
Neutro Abs: 6.4 10*3/uL (ref 1.7–7.7)
Neutrophils Relative %: 75 %

## 2016-08-22 LAB — BASIC METABOLIC PANEL
ANION GAP: 9 (ref 5–15)
BUN: 19 mg/dL (ref 6–20)
CALCIUM: 10 mg/dL (ref 8.9–10.3)
CHLORIDE: 102 mmol/L (ref 101–111)
CO2: 28 mmol/L (ref 22–32)
Creatinine, Ser: 1.65 mg/dL — ABNORMAL HIGH (ref 0.61–1.24)
GFR calc Af Amer: 47 mL/min — ABNORMAL LOW (ref >60)
GFR calc non Af Amer: 41 mL/min — ABNORMAL LOW (ref >60)
GLUCOSE: 112 mg/dL — AB (ref 65–99)
Potassium: 3.6 mmol/L (ref 3.5–5.1)
Sodium: 139 mmol/L (ref 135–145)

## 2016-08-22 LAB — CBC
HCT: 41.4 % (ref 39.0–52.0)
HEMOGLOBIN: 14.6 g/dL (ref 13.0–17.0)
MCH: 33.4 pg (ref 26.0–34.0)
MCHC: 35.3 g/dL (ref 30.0–36.0)
MCV: 94.7 fL (ref 78.0–100.0)
Platelets: 200 10*3/uL (ref 150–400)
RBC: 4.37 MIL/uL (ref 4.22–5.81)
RDW: 12.9 % (ref 11.5–15.5)
WBC: 8.8 10*3/uL (ref 4.0–10.5)

## 2016-08-22 LAB — I-STAT TROPONIN, ED: Troponin i, poc: 0.09 ng/mL (ref 0.00–0.08)

## 2016-08-22 LAB — PROTIME-INR
INR: 0.94
PROTHROMBIN TIME: 12.6 s (ref 11.4–15.2)

## 2016-08-22 LAB — TROPONIN I
TROPONIN I: 0.14 ng/mL — AB (ref <0.03)
TROPONIN I: 0.15 ng/mL — AB (ref <0.03)

## 2016-08-22 LAB — D-DIMER, QUANTITATIVE: D-Dimer, Quant: <0.27 ug/mL-FEU (ref 0.00–0.50)

## 2016-08-22 LAB — APTT: APTT: 29 s (ref 24–36)

## 2016-08-22 MED ORDER — SODIUM CHLORIDE 0.9% FLUSH: 3.0000 mL | Freq: Two times a day (BID) | INTRAVENOUS | Status: DC

## 2016-08-22 MED ORDER — MORPHINE SULFATE (PF) 4 MG/ML IV SOLN
4.0000 mg | INTRAVENOUS | Status: DC | PRN
  Administered 2016-08-22: 4 mg via INTRAVENOUS
  Filled 2016-08-22: qty 1

## 2016-08-22 MED ORDER — HEPARIN BOLUS VIA INFUSION
4000.0000 [IU] | Freq: Once | INTRAVENOUS | Status: AC
  Administered 2016-08-22: 4000 [IU] via INTRAVENOUS

## 2016-08-22 MED ORDER — AMLODIPINE BESYLATE 5 MG PO TABS: 5.0000 mg | ORAL_TABLET | Freq: Every morning | ORAL | Status: DC

## 2016-08-22 MED ORDER — ASPIRIN 81 MG PO TBEC: 81.0000 mg | DELAYED_RELEASE_TABLET | Freq: Every day | ORAL | Status: DC

## 2016-08-22 MED ORDER — TAMSULOSIN HCL 0.4 MG PO CAPS
0.4000 mg | ORAL_CAPSULE | Freq: Every day | ORAL | Status: DC
  Administered 2016-08-23: 0.4 mg via ORAL
  Filled 2016-08-22: qty 1

## 2016-08-22 MED ORDER — ASPIRIN EC 81 MG PO TBEC: 81.0000 mg | DELAYED_RELEASE_TABLET | Freq: Every day | ORAL | Status: DC

## 2016-08-22 MED ORDER — SODIUM CHLORIDE 0.9% FLUSH: 3.0000 mL | INTRAVENOUS | Status: DC | PRN

## 2016-08-22 MED ORDER — ASPIRIN 81 MG PO CHEW
81.0000 mg | CHEWABLE_TABLET | ORAL | Status: AC
  Administered 2016-08-23: 81 mg via ORAL
  Filled 2016-08-22: qty 1

## 2016-08-22 MED ORDER — METOPROLOL TARTRATE 12.5 MG HALF TABLET
12.5000 mg | ORAL_TABLET | Freq: Two times a day (BID) | ORAL | Status: DC
  Administered 2016-08-23: 12.5 mg via ORAL
  Filled 2016-08-22: qty 1

## 2016-08-22 MED ORDER — SODIUM CHLORIDE 0.9 % IV SOLN
INTRAVENOUS | Status: AC
  Administered 2016-08-23 (×2): via INTRAVENOUS

## 2016-08-22 MED ORDER — SODIUM CHLORIDE 0.9 % WEIGHT BASED INFUSION
1.0000 mL/kg/h | INTRAVENOUS | Status: DC
  Administered 2016-08-23: 1 mL/kg/h via INTRAVENOUS

## 2016-08-22 MED ORDER — SODIUM CHLORIDE 0.9 % WEIGHT BASED INFUSION
3.0000 mL/kg/h | INTRAVENOUS | Status: DC
  Administered 2016-08-23: 3 mL/kg/h via INTRAVENOUS

## 2016-08-22 MED ORDER — NITROGLYCERIN 0.1 MG/HR TD PT24
0.1000 mg | MEDICATED_PATCH | Freq: Every day | TRANSDERMAL | Status: DC
  Administered 2016-08-23: 0.1 mg via TRANSDERMAL
  Filled 2016-08-22 (×2): qty 1

## 2016-08-22 MED ORDER — FINASTERIDE 5 MG PO TABS
5.0000 mg | ORAL_TABLET | Freq: Every evening | ORAL | Status: DC
  Administered 2016-08-23: 5 mg via ORAL
  Filled 2016-08-22: qty 1

## 2016-08-22 MED ORDER — NITROGLYCERIN 0.4 MG SL SUBL: 0.4000 mg | SUBLINGUAL_TABLET | SUBLINGUAL | Status: DC | PRN

## 2016-08-22 MED ORDER — ONDANSETRON HCL 4 MG/2ML IJ SOLN: 4.0000 mg | Freq: Four times a day (QID) | INTRAMUSCULAR | Status: DC | PRN

## 2016-08-22 MED ORDER — ASPIRIN 81 MG PO CHEW
324.0000 mg | CHEWABLE_TABLET | Freq: Once | ORAL | Status: AC
  Administered 2016-08-22: 324 mg via ORAL
  Filled 2016-08-22: qty 4

## 2016-08-22 MED ORDER — ASPIRIN 81 MG PO CHEW: 324.0000 mg | CHEWABLE_TABLET | ORAL | Status: DC

## 2016-08-22 MED ORDER — HEPARIN (PORCINE) IN NACL 100-0.45 UNIT/ML-% IJ SOLN
1000.0000 [IU]/h | INTRAMUSCULAR | Status: DC
  Administered 2016-08-22: 1000 [IU]/h via INTRAVENOUS
  Filled 2016-08-22: qty 250

## 2016-08-22 MED ORDER — ACETAMINOPHEN 325 MG PO TABS: 650.0000 mg | ORAL_TABLET | ORAL | Status: DC | PRN

## 2016-08-22 MED ORDER — NITROGLYCERIN 0.4 MG SL SUBL
0.4000 mg | SUBLINGUAL_TABLET | SUBLINGUAL | Status: DC | PRN
  Administered 2016-08-22 (×2): 0.4 mg via SUBLINGUAL
  Filled 2016-08-22: qty 1

## 2016-08-22 MED ORDER — SODIUM CHLORIDE 0.9 % IV SOLN: 250.0000 mL | INTRAVENOUS | Status: DC | PRN

## 2016-08-22 MED ORDER — ASPIRIN 300 MG RE SUPP: 300.0000 mg | RECTAL | Status: DC

## 2016-08-22 MED ORDER — PANTOPRAZOLE SODIUM 40 MG PO TBEC: 40.0000 mg | DELAYED_RELEASE_TABLET | Freq: Every day | ORAL | Status: DC

## 2016-08-22 MED ORDER — HYOSCYAMINE SULFATE 0.125 MG SL SUBL
0.1250 mg | SUBLINGUAL_TABLET | Freq: Four times a day (QID) | SUBLINGUAL | Status: DC | PRN
  Filled 2016-08-22: qty 1

## 2016-08-22 NOTE — ED Notes (Signed)
Pt back from x-ray.

## 2016-08-22 NOTE — ED Triage Notes (Signed)
Pt c/o chest pain all day but last two hours has gotten severe. Pt has c/o right arm pain and tingling.

## 2016-08-22 NOTE — H&P (Addendum)
CARDIOLOGY H&P  Cardiologist: Harl Bowie  HPI:  71 y/o male with h/o CAD s/p CABG 2000, HTN, HL (intolerant of statins/ezetimibe) now on trial with bempedoic acid, HTN and CKD 3 (Creatinine 1.5-1.6) admitted for NSTEMI.   Has done very well since CABG. Has not neeed repeat cath. Was having episodes of CP last year and saw Jory Sims NP underwent echo and stress testing last year.   - 06/2015 completed echo which showed normal LVEF. Exercise MPI he did 8 min 25 sec with no EKG changes and no ischemia by imaging.   Last seen by Dr. Harl Bowie on 07/23/16 in Clinic and was not having any CP.   Over past 2-3 weeks has been having indigestion. Not relieved with PPI. Mild relief with Mylanta. Was at grandson's baseball game today and had worsening chest pressure and some diaphoresis. Seemed to get better with walking but would't go away. Pain different than previous angina in 2000. Presented to the [facility] ER today with waxing and waning CP. ECG normal. Trop 0.14. CP resolved with sl NTG. Transferred to [facility] for cath.    Review of Systems:     Cardiac Review of Systems: {Y] = yes [ ]  = no  Chest Pain [  y  ]  Resting SOB [   ] Exertional SOB  [  ]  Orthopnea [  ]   Pedal Edema [   ]    Palpitations [  ] Syncope  [  ]   Presyncope [   ]  General Review of Systems: [Y] = yes [  ]=no Constitional: recent weight change [  ]; anorexia [  ]; fatigue [  ]; nausea [  ]; night sweats [  ]; fever [  ]; or chills [  ];                                                                     Dental: poor dentition[  ];   Eye : blurred vision [  ]; diplopia [   ]; vision changes [  ];  Amaurosis fugax[  ]; Resp: cough [  ];  wheezing[  ];  hemoptysis[  ]; shortness of breath[  ]; paroxysmal nocturnal dyspnea[  ]; dyspnea on exertion[  ]; or orthopnea[  ];  GI:  gallstones[  ], vomiting[  ];  dysphagia[  ]; melena[  ];  hematochezia [  ]; heartburn[ y ];   GU: kidney stones [  ]; hematuria[  ];   dysuria [  ];  nocturia[   ];               Skin: rash [  ], swelling[  ];, hair loss[  ];  peripheral edema[  ];  or itching[  ]; Musculosketetal: myalgias[  ];  joint swelling[  ];  joint erythema[  ];  joint pain[y  ];  back pain[  ];  Heme/Lymph: bruising[  ];  bleeding[  ];  anemia[  ];  Neuro: TIA[  ];  headaches[  ];  stroke[  ];  vertigo[  ];  seizures[  ];   paresthesias[  ];  difficulty walking[  ];  Psych:depression[  ]; anxiety[  ];  Endocrine: diabetes[  ];  thyroid dysfunction[  ];  Other:  Past Medical History:  Diagnosis Date  . Arteriosclerotic cardiovascular disease (ASCVD)    CABG in 08/1998  . BPH (benign prostatic hypertrophy)    h/x of prostatis/ UTI in 2006  . Chronic kidney disease, stage II (mild) 2010   Creatinine of 1.5 in 2010  . Coronary artery disease   . GERD (gastroesophageal reflux disease)    s/p espohageal dilatatin and repair of hiatal hernia  . Hyperlipidemia   . Hypertension   . IBS (irritable bowel syndrome)   . PONV (postoperative nausea and vomiting)   . Sigmoid diverticulosis     Prior to Admission medications   Medication Sig Start Date End Date Taking? Authorizing Provider  acetaminophen (TYLENOL) 325 MG tablet Take 650 mg by mouth every 6 (six) hours as needed for moderate pain.   Yes [provider]  amLODipine (NORVASC) 10 MG tablet Take 5 mg by mouth every morning.    Yes [provider]  aspirin 81 MG EC tablet Take 81 mg by mouth at bedtime.    Yes [provider]  finasteride (PROSCAR) 5 MG tablet Take 5 mg by mouth every evening.   Yes [provider]  hydrochlorothiazide (HYDRODIURIL) 25 MG tablet Take 25 mg by mouth every morning.   Yes [provider]  hyoscyamine (LEVSIN SL) 0.125 MG SL tablet Place 1 tablet (0.125 mg total) under the tongue 4 (four) times daily. Before meals and at bedtime as needed for loose stools 05/25/14  Yes Annitta Needs, NP  Investigational - Study Medication Take 1 tablet by mouth  every morning. Study name:Bempedoic Acid 180mg  Additional study details:Cholesterol medication   Yes [provider]  omeprazole (PRILOSEC) 20 MG capsule Take 20 mg by mouth 2 (two) times daily. Before LUNCH and Before SUPPER   Yes [provider]  Probiotic Product (PROBIOTIC DAILY PO) Take 1 capsule by mouth daily.    Yes [provider]  psyllium (METAMUCIL) 58.6 % powder Take 1 packet by mouth daily.   Yes [provider]  tamsulosin (FLOMAX) 0.4 MG CAPS capsule Take 0.4 mg by mouth at bedtime.   Yes [provider]     Allergies  Allergen Reactions  . Statins Other (See Comments)    Severe muscle pain  . Codeine Nausea And Vomiting  . Demerol [Meperidine] Nausea And Vomiting  . Nalbuphine Nausea And Vomiting    Social History   Social History  . Marital status: Married    Spouse name: N/A  . Number of children: N/A  . Years of education: N/A   Occupational History  . Recruitment consultant [place] History Main Topics  . Smoking status: Never Smoker  . Smokeless tobacco: Never Used     Comment: no use of tobacco products  . Alcohol use 1.0 oz/week    2 Standard drinks or equivalent per week     Comment: "Occasionally"  . Drug use: No  . Sexual activity: Yes    Partners: Female   Other Topics Concern  . Not on file   Social History Narrative   Previously  Employed as an Recruitment consultant.     Family History  Problem Relation Age of Onset  . Coronary artery disease Father     also grandfather  . Colon cancer Neg Hx     PHYSICAL EXAM: Vitals:   08/22/16 2145 08/22/16 2150  BP:  139/70  Pulse: 64 63  Resp: 14 20  Temp:  General:  Well appearing. No respiratory difficulty HEENT: normal Neck: supple. no JVD. Carotids 2+ bilat; no bruits. No lymphadenopathy or thryomegaly appreciated. Cor: PMI nondisplaced. Regular rate & rhythm. No rubs, gallops or murmurs. Lungs: clear Abdomen: soft,  nontender, nondistended. No hepatosplenomegaly. No bruits or masses. Good bowel sounds. Extremities: no cyanosis, clubbing, rash, edema Neuro: alert & oriented x 3, cranial nerves grossly intact. moves all 4 extremities w/o difficulty. Affect pleasant.  ECG: NSR incomplete RBBB. No ST-T wave abnormalities.    Results for orders placed or performed during the hospital encounter of 08/22/16 (from the past 24 hour(s))  Basic metabolic panel     Status: Abnormal   Collection Time: 08/22/16  8:10 PM  Result Value Ref Range   Sodium 139 135 - 145 mmol/L   Potassium 3.6 3.5 - 5.1 mmol/L   Chloride 102 101 - 111 mmol/L   CO2 28 22 - 32 mmol/L   Glucose, Bld 112 (H) 65 - 99 mg/dL   BUN 19 6 - 20 mg/dL   Creatinine, Ser 1.65 (H) 0.61 - 1.24 mg/dL   Calcium 10.0 8.9 - 10.3 mg/dL   GFR calc non Af Amer 41 (L) >60 mL/min   GFR calc Af Amer 47 (L) >60 mL/min   Anion gap 9 5 - 15  CBC     Status: None   Collection Time: 08/22/16  8:10 PM  Result Value Ref Range   WBC 8.8 4.0 - 10.5 K/uL   RBC 4.37 4.22 - 5.81 MIL/uL   Hemoglobin 14.6 13.0 - 17.0 g/dL   HCT 41.4 39.0 - 52.0 %   MCV 94.7 78.0 - 100.0 fL   MCH 33.4 26.0 - 34.0 pg   MCHC 35.3 30.0 - 36.0 g/dL   RDW 12.9 11.5 - 15.5 %   Platelets 200 150 - 400 K/uL  Troponin I     Status: Abnormal   Collection Time: 08/22/16  8:10 PM  Result Value Ref Range   Troponin I 0.14 (HH) <0.03 ng/mL  Differential     Status: None   Collection Time: 08/22/16  8:10 PM  Result Value Ref Range   Neutrophils Relative % 75 %   Neutro Abs 6.4 1.7 - 7.7 K/uL   Lymphocytes Relative 18 %   Lymphs Abs 1.5 0.7 - 4.0 K/uL   Monocytes Relative 6 %   Monocytes Absolute 0.5 0.1 - 1.0 K/uL   Eosinophils Relative 1 %   Eosinophils Absolute 0.1 0.0 - 0.7 K/uL   Basophils Relative 0 %   Basophils Absolute 0.0 0.0 - 0.1 K/uL  D-dimer, quantitative     Status: None   Collection Time: 08/22/16  8:10 PM  Result Value Ref Range   D-Dimer, Quant <0.27 0.00 - 0.50  ug/mL-FEU  Protime-INR     Status: None   Collection Time: 08/22/16  8:10 PM  Result Value Ref Range   Prothrombin Time 12.6 11.4 - 15.2 seconds   INR 0.94   APTT     Status: None   Collection Time: 08/22/16  8:10 PM  Result Value Ref Range   aPTT 29 24 - 36 seconds   Dg Chest 2 View  Result Date: 08/22/2016 CLINICAL DATA:  Severe chest pain with right arm paresthesias for 3 hours. Coronary artery disease. EXAM: CHEST  2 VIEW COMPARISON:  06/21/2013 FINDINGS: The heart size and mediastinal contours are within normal limits. Prior CABG noted. Both lungs are clear. Small hiatal hernia is seen. The visualized skeletal  structures are unremarkable. IMPRESSION: No active cardiopulmonary disease. Small hiatal hernia. Electronically Signed   By: Earle Gell M.D.   On: 08/22/2016 21:01     ASSESSMENT:  1. NSTEMI 2. CAD s/p CABG 3. HTN 4. HL  - intolerant of statins and ezetimibe. In research trial with bempedoic acid -consider PCSK-9 inhibitor  PLAN/DISCUSSION:  CP has typical and atypical features but troponin is positive in setting of 71 year old CABG grafts. Will need cath in am. Continue ASA, heparin. Start low-dose b-blocker. Intolerant of all statins. Can consider PCSK-9 inhibitor as outpatient. Will cycle troponins tonight. Check lipids in am.   Glori Bickers, MD  10:46 PM

## 2016-08-22 NOTE — ED Notes (Signed)
Patient transported to X-ray 

## 2016-08-22 NOTE — Progress Notes (Signed)
ANTICOAGULATION CONSULT NOTE - Initial Consult  Pharmacy Consult for Heparin Indication: chest pain/ACS  Allergies  Allergen Reactions  . Statins Other (See Comments)    Severe muscle pain  . Codeine Nausea And Vomiting  . Demerol [Meperidine] Nausea And Vomiting  . Nalbuphine Nausea And Vomiting    Patient Measurements: Height: 5\' 10"  (177.8 cm) Weight: 180 lb (81.6 kg) IBW/kg (Calculated) : 73 HEPARIN DW (KG): 81.6   Vital Signs: Temp: 97.8 F (36.6 C) (05/09 1943) BP: 112/66 (05/09 2050) Pulse Rate: 60 (05/09 2050)  Labs:  Recent Labs  08/22/16 2010  HGB 14.6  HCT 41.4  PLT 200  CREATININE 1.65*  TROPONINI 0.14*    Estimated Creatinine Clearance: 43.6 mL/min (A) (by C-G formula based on SCr of 1.65 mg/dL (H)).   Medical History: Past Medical History:  Diagnosis Date  . Arteriosclerotic cardiovascular disease (ASCVD)    CABG in 08/1998  . BPH (benign prostatic hypertrophy)    h/x of prostatis/ UTI in 2006  . Chronic kidney disease, stage II (mild) 2010   Creatinine of 1.5 in 2010  . Coronary artery disease   . GERD (gastroesophageal reflux disease)    s/p espohageal dilatatin and repair of hiatal hernia  . Hyperlipidemia   . Hypertension   . IBS (irritable bowel syndrome)   . PONV (postoperative nausea and vomiting)   . Sigmoid diverticulosis     Medications:  Home Meds reviewed.  Admission med history pending. ASA ordered in ED.  Assessment: Okay for Protocol, no bleeding noted.  Baseline anticoag labs pending.  Likely admission for [facility] hospital for further work up.  Goal of Therapy:  Heparin level 0.3-0.7 units/ml Monitor platelets by anticoagulation protocol: Yes   Plan:  Give 4000 units bolus x 1 Start heparin infusion at 1000 units/hr Check anti-Xa level in 6-8 hours and daily while on heparin Continue to monitor H&H and platelets  Pricilla Larsson 08/22/2016,9:12 PM

## 2016-08-22 NOTE — ED Notes (Signed)
CRITICAL VALUE ALERT  Critical value received:  Troponin- 0.09  Date of notification:  08/22/16  Time of notification:  2024  Critical value read back:Yes.    Nurse who received alert:  Rosalie Gums, RN MD notified (1st page):  Dr Thurnell Garbe  Time of first page:  2024  MD notified (2nd page):  Time of second page:  Responding MD:  Dr Thurnell Garbe  Time MD responded:  2024

## 2016-08-22 NOTE — ED Provider Notes (Signed)
[facility] DEPT Provider Note   CSN: [id number] Arrival date & time: 08/22/16  1934     History   Chief Complaint Chief Complaint  Patient presents with  . Chest Pain    HPI [name] is a 71 y.o. male.  HPI  Pt was seen at 2020. Per pt, c/o gradual onset and persistence of waxing and waning chest "pain" for the past day. Pt describes the CP as mid-sternal and "shooting." States the CP worsens with activity, improves with rest, lasting 1 to 2 hours per episode. Pt states the CP worsened 2 hours ago while he was watching a baseball game. Pt also states he has had intermittent "heartburn" for the past 2 to 3 weeks; unrelieved by antacids and carafate. States the heartburn began after he was taking prednisone for partial tear of his right achilles tendon. Denies palpitations, no SOB/cough, no abd pain, no N/V/D, no back pain, no fevers, no rash, no injury.    Past Medical History:  Diagnosis Date  . Arteriosclerotic cardiovascular disease (ASCVD)    CABG in 08/1998  . BPH (benign prostatic hypertrophy)    h/x of prostatis/ UTI in 2006  . Chronic kidney disease, stage II (mild) 2010   Creatinine of 1.5 in 2010  . Coronary artery disease   . GERD (gastroesophageal reflux disease)    s/p espohageal dilatatin and repair of hiatal hernia  . Hyperlipidemia   . Hypertension   . IBS (irritable bowel syndrome)   . PONV (postoperative nausea and vomiting)   . Sigmoid diverticulosis     Patient Active Problem List   Diagnosis Date Noted  . IBS (irritable bowel syndrome) 08/03/2013  . Chest pain on exertion 07/16/2012  . Acute on chronic renal failure ([place]) 07/16/2012  . Hyponatremia 07/16/2012  . Arteriosclerotic cardiovascular disease (ASCVD)   . GERD (gastroesophageal reflux disease)   . Hypertension   . Hyperlipidemia   . BPH (benign prostatic hypertrophy)   . CHRONIC KIDNEY DISEASE-STAGE II 04/05/2009    Past Surgical History:  Procedure Laterality Date  . BREAST  BIOPSY Right 08/17/2013   Procedure: RIGHT BREAST BIOPSY;  Surgeon: [name], MD;  Location: [facility];  Service: General;  Laterality: Right;  . CHOLECYSTECTOMY  1990s  . COLONOSCOPY  03/17/2012   [place]k; colonic diverticulosis, repeat screening colonoscopy in 10 years.  . CORONARY ARTERY BYPASS GRAFT  2000  . ESOPHAGOGASTRODUODENOSCOPY  May 2009   Rourk: Food impaction, critical esophageal ring/stricture requiring dilation  . HEMORRHOID SURGERY  2007   total of three  . HIATAL HERNIA REPAIR  1995  . LUMBAR LAMINECTOMY  1999       Home Medications    Prior to Admission medications   Medication Sig Start Date End Date Taking? Authorizing Provider  acetaminophen (TYLENOL) 325 MG tablet Take 650 mg by mouth every 6 (six) hours as needed for moderate pain.    [provider]  amLODipine (NORVASC) 10 MG tablet Take 5 mg by mouth every morning.     [provider]  aspirin 81 MG EC tablet Take 81 mg by mouth at bedtime.     [provider]  finasteride (PROSCAR) 5 MG tablet Take 5 mg by mouth every evening.    [provider]  hydrochlorothiazide (HYDRODIURIL) 25 MG tablet Take 25 mg by mouth every morning.    [provider]  hyoscyamine (LEVSIN SL) 0.125 MG SL tablet Place 1 tablet (0.125 mg total) under the tongue 4 (four)  times daily. Before meals and at bedtime as needed for loose stools 05/25/14   Annitta Needs, NP  omeprazole (PRILOSEC) 20 MG capsule Take 20 mg by mouth 2 (two) times daily. Before LUNCH and Before SUPPER    [provider]  Probiotic Product (PROBIOTIC DAILY PO) Take by mouth.    [provider]  psyllium (METAMUCIL) 58.6 % powder Take 1 packet by mouth daily.    [provider]  tamsulosin (FLOMAX) 0.4 MG CAPS capsule Take 0.4 mg by mouth at bedtime.    [provider]    Family History Family History  Problem Relation Age of Onset  . Coronary artery disease Father     also  grandfather  . Colon cancer Neg Hx     Social History Social History  Substance Use Topics  . Smoking status: Never Smoker  . Smokeless tobacco: Never Used     Comment: no use of tobacco products  . Alcohol use 1.0 oz/week    2 Standard drinks or equivalent per week     Comment: "Occasionally"     Allergies   Statins; Codeine; Demerol [meperidine]; and Nalbuphine   Review of Systems Review of Systems ROS: Statement: All systems negative except as marked or noted in the HPI; Constitutional: Negative for fever and chills. ; ; Eyes: Negative for eye pain, redness and discharge. ; ; ENMT: Negative for ear pain, hoarseness, nasal congestion, sinus pressure and sore throat. ; ; Cardiovascular: +CP. Negative for palpitations, diaphoresis, dyspnea and peripheral edema. ; ; Respiratory: Negative for cough, wheezing and stridor. ; ; Gastrointestinal: Negative for nausea, vomiting, diarrhea, abdominal pain, blood in stool, hematemesis, jaundice and rectal bleeding. . ; ; Genitourinary: Negative for dysuria, flank pain and hematuria. ; ; Musculoskeletal: Negative for back pain and neck pain. Negative for swelling and trauma.; ; Skin: Negative for pruritus, rash, abrasions, blisters, bruising and skin lesion.; ; Neuro: Negative for headache, lightheadedness and neck stiffness. Negative for weakness, altered level of consciousness, altered mental status, extremity weakness, paresthesias, involuntary movement, seizure and syncope.       Physical Exam Updated Vital Signs BP 123/81   Pulse 62   Temp 97.8 F (36.6 C)   Resp 14   Ht 5\' 10"  (1.778 m)   Wt 180 lb (81.6 kg)   SpO2 100%   BMI 25.83 kg/m   Physical Exam 2025: Physical examination:  Nursing notes reviewed; Vital signs and O2 SAT reviewed;  Constitutional: Well developed, Well nourished, Well hydrated, In no acute distress; Head:  Normocephalic, atraumatic; Eyes: EOMI, PERRL, No scleral icterus; ENMT: Mouth and pharynx normal, Mucous  membranes moist; Neck: Supple, Full range of motion, No lymphadenopathy; Cardiovascular: Regular rate and rhythm, No gallop; Respiratory: Breath sounds clear & equal bilaterally, No wheezes.  Speaking full sentences with ease, Normal respiratory effort/excursion; Chest: Nontender, Movement normal; Abdomen: Soft, Nontender, Nondistended, Normal bowel sounds; Genitourinary: No CVA tenderness; Extremities: Pulses normal, No tenderness, +1 RLE pedal edema, with calf asymmetry.; Neuro: AA&Ox3, Major CN grossly intact.  Speech clear. No gross focal motor or sensory deficits in extremities.; Skin: Color normal, Warm, Dry.   ED Treatments / Results  Labs (all labs ordered are listed, but only abnormal results are displayed)   EKG  EKG Interpretation  Date/Time:  Wednesday Aug 22 2016 19:40:23 EDT Ventricular Rate:  73 PR Interval:  152 QRS Duration: 110 QT Interval:  400 QTC Calculation: 440 R Axis:   19 Text Interpretation:  Normal sinus rhythm  Incomplete right bundle branch block Possible Inferior infarct , age undetermined When compared with ECG of 06/21/2013 No significant change was found Confirmed by [place]  MD, Nunzio Cory [phone]) on 08/22/2016 8:31:34 PM       Radiology    Procedures Procedures (including critical care time)  Medications Ordered in ED Medications  aspirin chewable tablet 324 mg (not administered)  nitroGLYCERIN (NITROSTAT) SL tablet 0.4 mg (not administered)  morphine 4 MG/ML injection 4 mg (not administered)     Initial Impression / Assessment and Plan / ED Course  I have reviewed the triage vital signs and the nursing notes.  Pertinent labs & imaging results that were available during my care of the patient were reviewed by me and considered in my medical decision making (see chart for details).  MDM Reviewed: previous chart, nursing note and vitals Reviewed previous: labs and ECG Interpretation: labs, ECG and x-ray Consults: cardiology   CRITICAL  CARE Performed by: Alfonzo Feller Total critical care time: 35 minutes Critical care time was exclusive of separately billable procedures and treating other patients. Critical care was necessary to treat or prevent imminent or life-threatening deterioration. Critical care was time spent personally by me on the following activities: development of treatment plan with patient and/or surrogate as well as nursing, discussions with consultants, evaluation of patient's response to treatment, examination of patient, obtaining history from patient or surrogate, ordering and performing treatments and interventions, ordering and review of laboratory studies, ordering and review of radiographic studies, pulse oximetry and re-evaluation of patient's condition.    Results for orders placed or performed during the hospital encounter of 23/76/28  Basic metabolic panel  Result Value Ref Range   Sodium 139 135 - 145 mmol/L   Potassium 3.6 3.5 - 5.1 mmol/L   Chloride 102 101 - 111 mmol/L   CO2 28 22 - 32 mmol/L   Glucose, Bld 112 (H) 65 - 99 mg/dL   BUN 19 6 - 20 mg/dL   Creatinine, Ser 1.65 (H) 0.61 - 1.24 mg/dL   Calcium 10.0 8.9 - 10.3 mg/dL   GFR calc non Af Amer 41 (L) >60 mL/min   GFR calc Af Amer 47 (L) >60 mL/min   Anion gap 9 5 - 15  CBC  Result Value Ref Range   WBC 8.8 4.0 - 10.5 K/uL   RBC 4.37 4.22 - 5.81 MIL/uL   Hemoglobin 14.6 13.0 - 17.0 g/dL   HCT 41.4 39.0 - 52.0 %   MCV 94.7 78.0 - 100.0 fL   MCH 33.4 26.0 - 34.0 pg   MCHC 35.3 30.0 - 36.0 g/dL   RDW 12.9 11.5 - 15.5 %   Platelets 200 150 - 400 K/uL  Troponin I  Result Value Ref Range   Troponin I 0.14 (HH) <0.03 ng/mL  Differential  Result Value Ref Range   Neutrophils Relative % 75 %   Neutro Abs 6.4 1.7 - 7.7 K/uL   Lymphocytes Relative 18 %   Lymphs Abs 1.5 0.7 - 4.0 K/uL   Monocytes Relative 6 %   Monocytes Absolute 0.5 0.1 - 1.0 K/uL   Eosinophils Relative 1 %   Eosinophils Absolute 0.1 0.0 - 0.7 K/uL    Basophils Relative 0 %   Basophils Absolute 0.0 0.0 - 0.1 K/uL  D-dimer, quantitative  Result Value Ref Range   D-Dimer, Quant <0.27 0.00 - 0.50 ug/mL-FEU    Results for [name], [name] (MRN [id number]) as of 08/22/2016 20:54  Ref. Range 07/18/2012 05:37  06/21/2013 14:58 08/12/2013 14:45 06/21/2015 16:08 08/22/2016 20:10  BUN Latest Ref Range: 6 - 20 mg/dL 18 19 17 16 19   Creatinine Latest Ref Range: 0.61 - 1.24 mg/dL 1.79 (H) 1.45 (H) 1.60 (H) 1.58 (H) 1.65 (H)    Dg Chest 2 View Result Date: 08/22/2016 CLINICAL DATA:  Severe chest pain with right arm paresthesias for 3 hours. Coronary artery disease. EXAM: CHEST  2 VIEW COMPARISON:  06/21/2013 FINDINGS: The heart size and mediastinal contours are within normal limits. Prior CABG noted. Both lungs are clear. Small hiatal hernia is seen. The visualized skeletal structures are unremarkable. IMPRESSION: No active cardiopulmonary disease. Small hiatal hernia. Electronically Signed   By: Earle Gell M.D.   On: 08/22/2016 21:01    2035:  I-stat trop 0.09; lab troponin sent. T/C to [facility] Cards Dr. Haroldine Laws, case discussed, including:  HPI, pertinent PM/SHx, VS/PE, dx testing, ED course and treatment:  Requests to call back after lab troponin for decision to remain at [facility] vs transfer to [facility].   2055:  Will start IV heparin. ASA and SL ntg being given with improvement of CP. T/C to [facility] Cards Dr. Haroldine Laws, case discussed, including:  HPI, pertinent PM/SHx, VS/PE, dx testing, ED course and treatment:  He has viewed the labs, agreeable to accept transfer to [facility], requests to write temporary orders, obtain tele bed to Cards service. Dx and testing d/w pt and family.  Questions answered.  Verb understanding, agreeable to transfer to [facility] for admit.    Final Clinical Impressions(s) / ED Diagnoses   Final diagnoses:  None    New Prescriptions New Prescriptions   No medications on file     Francine Graven, DO 08/25/16 2143

## 2016-08-23 ENCOUNTER — Encounter (HOSPITAL_COMMUNITY): Payer: Self-pay

## 2016-08-23 ENCOUNTER — Encounter (HOSPITAL_COMMUNITY)
Admission: EM | Disposition: A | Discharge status: Home / Self Care | Payer: Self-pay | Source: Home / Self Care | Attending: Cardiology

## 2016-08-23 DIAGNOSIS — N183 Chronic kidney disease, stage 3 (moderate): Secondary | ICD-10-CM | POA: Diagnosis not present

## 2016-08-23 DIAGNOSIS — I129 Hypertensive chronic kidney disease with stage 1 through stage 4 chronic kidney disease, or unspecified chronic kidney disease: Secondary | ICD-10-CM | POA: Diagnosis not present

## 2016-08-23 DIAGNOSIS — I2510 Atherosclerotic heart disease of native coronary artery without angina pectoris: Secondary | ICD-10-CM

## 2016-08-23 DIAGNOSIS — Z006 Encounter for examination for normal comparison and control in clinical research program: Secondary | ICD-10-CM | POA: Diagnosis not present

## 2016-08-23 DIAGNOSIS — Z951 Presence of aortocoronary bypass graft: Secondary | ICD-10-CM | POA: Diagnosis not present

## 2016-08-23 DIAGNOSIS — I214 Non-ST elevation (NSTEMI) myocardial infarction: Secondary | ICD-10-CM | POA: Diagnosis not present

## 2016-08-23 HISTORY — PX: LEFT HEART CATH AND CORS/GRAFTS ANGIOGRAPHY: CATH118250

## 2016-08-23 LAB — BASIC METABOLIC PANEL
Anion gap: 8 (ref 5–15)
BUN: 15 mg/dL (ref 6–20)
CHLORIDE: 102 mmol/L (ref 101–111)
CO2: 30 mmol/L (ref 22–32)
CREATININE: 1.58 mg/dL — AB (ref 0.61–1.24)
Calcium: 9.3 mg/dL (ref 8.9–10.3)
GFR calc Af Amer: 50 mL/min — ABNORMAL LOW (ref >60)
GFR calc non Af Amer: 43 mL/min — ABNORMAL LOW (ref >60)
GLUCOSE: 113 mg/dL — AB (ref 65–99)
POTASSIUM: 3.7 mmol/L (ref 3.5–5.1)
SODIUM: 140 mmol/L (ref 135–145)

## 2016-08-23 LAB — POCT ACTIVATED CLOTTING TIME: Activated Clotting Time: 131 seconds

## 2016-08-23 LAB — CBC
HEMATOCRIT: 39.8 % (ref 39.0–52.0)
Hemoglobin: 13.1 g/dL (ref 13.0–17.0)
MCH: 31.4 pg (ref 26.0–34.0)
MCHC: 32.9 g/dL (ref 30.0–36.0)
MCV: 95.4 fL (ref 78.0–100.0)
Platelets: 186 10*3/uL (ref 150–400)
RBC: 4.17 MIL/uL — ABNORMAL LOW (ref 4.22–5.81)
RDW: 13 % (ref 11.5–15.5)
WBC: 6.3 10*3/uL (ref 4.0–10.5)

## 2016-08-23 LAB — LIPID PANEL
Cholesterol: 193 mg/dL (ref 0–200)
HDL: 40 mg/dL — ABNORMAL LOW (ref >40)
LDL Cholesterol: 133 mg/dL — ABNORMAL HIGH (ref 0–99)
TRIGLYCERIDES: 100 mg/dL (ref <150)
Total CHOL/HDL Ratio: 4.8 RATIO
VLDL: 20 mg/dL (ref 0–40)

## 2016-08-23 LAB — HEPARIN LEVEL (UNFRACTIONATED): Heparin Unfractionated: 0.46 IU/mL (ref 0.30–0.70)

## 2016-08-23 LAB — TROPONIN I: TROPONIN I: 0.23 ng/mL — AB (ref <0.03)

## 2016-08-23 SURGERY — LEFT HEART CATH AND CORS/GRAFTS ANGIOGRAPHY
Anesthesia: LOCAL

## 2016-08-23 MED ORDER — MIDAZOLAM HCL 2 MG/2ML IJ SOLN
INTRAMUSCULAR | Status: AC
  Filled 2016-08-23: qty 2

## 2016-08-23 MED ORDER — MIDAZOLAM HCL 2 MG/2ML IJ SOLN
INTRAMUSCULAR | Status: DC | PRN
  Administered 2016-08-23: 2 mg via INTRAVENOUS

## 2016-08-23 MED ORDER — IOPAMIDOL (ISOVUE-370) INJECTION 76%
INTRAVENOUS | Status: DC | PRN
  Administered 2016-08-23: 125 mL via INTRA_ARTERIAL

## 2016-08-23 MED ORDER — HEPARIN (PORCINE) IN NACL 2-0.9 UNIT/ML-% IJ SOLN
INTRAMUSCULAR | Status: AC | PRN
  Administered 2016-08-23: 1000 mL via INTRA_ARTERIAL

## 2016-08-23 MED ORDER — SODIUM CHLORIDE 0.9 % IV SOLN: INTRAVENOUS | Status: AC

## 2016-08-23 MED ORDER — SODIUM CHLORIDE 0.9% FLUSH: 3.0000 mL | Freq: Two times a day (BID) | INTRAVENOUS | Status: DC

## 2016-08-23 MED ORDER — LIDOCAINE HCL (PF) 1 % IJ SOLN
INTRAMUSCULAR | Status: DC | PRN
  Administered 2016-08-23: 15 mL via INTRADERMAL

## 2016-08-23 MED ORDER — SODIUM CHLORIDE 0.9 % IV SOLN: 250.0000 mL | INTRAVENOUS | Status: DC | PRN

## 2016-08-23 MED ORDER — METOPROLOL TARTRATE 25 MG PO TABS: 12.5000 mg | ORAL_TABLET | Freq: Two times a day (BID) | ORAL | 3 refills | Status: DC

## 2016-08-23 MED ORDER — IOPAMIDOL (ISOVUE-370) INJECTION 76%
INTRAVENOUS | Status: AC
  Filled 2016-08-23: qty 125

## 2016-08-23 MED ORDER — SODIUM CHLORIDE 0.9% FLUSH: 3.0000 mL | INTRAVENOUS | Status: DC | PRN

## 2016-08-23 MED ORDER — LIDOCAINE HCL 1 % IJ SOLN
INTRAMUSCULAR | Status: AC
  Filled 2016-08-23: qty 20

## 2016-08-23 MED ORDER — HEPARIN (PORCINE) IN NACL 2-0.9 UNIT/ML-% IJ SOLN
INTRAMUSCULAR | Status: AC
  Filled 2016-08-23: qty 1000

## 2016-08-23 SURGICAL SUPPLY — 9 items
CATH INFINITI 5 FR IM (CATHETERS) ×2 IMPLANT
CATH INFINITI 5FR MULTPACK ANG (CATHETERS) ×2 IMPLANT
GUIDEWIRE 3MM J TIP .035 145 (WIRE) ×2 IMPLANT
KIT HEART LEFT (KITS) ×2 IMPLANT
PACK CARDIAC CATHETERIZATION (CUSTOM PROCEDURE TRAY) ×2 IMPLANT
SHEATH PINNACLE 5F 10CM (SHEATH) ×2 IMPLANT
TRANSDUCER W/STOPCOCK (MISCELLANEOUS) ×2 IMPLANT
TUBING CIL FLEX 10 FLL-RA (TUBING) ×2 IMPLANT
WIRE EMERALD 3MM-J .035X260CM (WIRE) ×2 IMPLANT

## 2016-08-23 NOTE — Progress Notes (Signed)
After bedrest Pt assisted to ambulate 300 ft without complaint or issue.  Right groin remains level zero, dressing changed to band aid.  Will con't plan of care.

## 2016-08-23 NOTE — Progress Notes (Signed)
ANTICOAGULATION CONSULT NOTE - Follow-up Consult  Pharmacy Consult for Heparin Indication: chest pain/ACS  Allergies  Allergen Reactions  . Statins Other (See Comments)    Severe muscle pain  . Codeine Nausea And Vomiting  . Demerol [Meperidine] Nausea And Vomiting  . Nalbuphine Nausea And Vomiting    Patient Measurements: Height: 5\' 10"  (177.8 cm) Weight: 179 lb 4.8 oz (81.3 kg) IBW/kg (Calculated) : 73 HEPARIN DW (KG): 81.3   Vital Signs: Temp: 98.6 F (37 C) (05/10 0555) Temp Source: Oral (05/10 0555) BP: 98/60 (05/10 0555) Pulse Rate: 55 (05/10 0555)  Labs:  Recent Labs  08/22/16 2010 08/22/16 2305 08/23/16 0508  HGB 14.6  --  13.1  HCT 41.4  --  39.8  PLT 200  --  186  APTT 29  --   --   LABPROT 12.6  --   --   INR 0.94  --   --   HEPARINUNFRC  --   --  0.46  CREATININE 1.65*  --  1.58*  TROPONINI 0.14* 0.15* 0.23*   Estimated Creatinine Clearance: 45.6 mL/min (A) (by C-G formula based on SCr of 1.58 mg/dL (H)).  Medical History: Past Medical History:  Diagnosis Date  . Arteriosclerotic cardiovascular disease (ASCVD)    CABG in 08/1998  . BPH (benign prostatic hypertrophy)    h/x of prostatis/ UTI in 2006  . Chronic kidney disease, stage II (mild) 2010   Creatinine of 1.5 in 2010  . Coronary artery disease   . GERD (gastroesophageal reflux disease)    s/p espohageal dilatatin and repair of hiatal hernia  . Hyperlipidemia   . Hypertension   . IBS (irritable bowel syndrome)   . PONV (postoperative nausea and vomiting)   . Sigmoid diverticulosis    Medications:  Home Meds reviewed.  Admission med history pending. ASA ordered in ED.  Assessment: 71 y/o male admitted for NSTEMI. Troponin trending up, heparin started overnight. CBC stable, no bleeding documented.   Heparin level 0.46  Goal of Therapy:  Heparin level 0.3-0.7 units/ml Monitor platelets by anticoagulation protocol: Yes   Plan:  Continue heparin infusion at 1000 units/hr Check  anti-Xa level in 6-8 hours and daily while on heparin Continue to monitor H&H and platelets  Haruo Stepanek L Lanyia Jewel 08/23/2016,6:25 AM

## 2016-08-23 NOTE — Discharge Summary (Signed)
Discharge Summary    Patient ID: [name],  MRN: [id number], DOB/AGE: 1946-04-11 71 y.o.  Admit date: 08/22/2016 Discharge date: 08/23/2016  Primary Care Provider: Sharilyn Sites Primary Cardiologist: Harl Bowie   Discharge Diagnoses    Active Problems:   NSTEMI (non-ST elevation myocardial infarction) [facility])   Allergies Allergies  Allergen Reactions  . Statins Other (See Comments)    Severe muscle pain  . Codeine Nausea And Vomiting  . Demerol [Meperidine] Nausea And Vomiting  . Nalbuphine Nausea And Vomiting    Diagnostic Studies/Procedures    LHC: 08/23/16  Conclusion     Mid RCA lesion, 80 %stenosed. Dist RCA lesion, 100 %stenosed.  SVG-mRPDA graft was visualized by angiography and is large and anatomically normal.  Potential Culpril (not PCI Target). Ost RPDA lesion, 90 %stenosed, Ost RPDA to RPDA lesion, 70 %stenosed.- filled retrograde from SVG  Ost 3rd Mrg to 3rd Mrg lesion, 80 %stenosed.  SVG-3rd OM graft was visualized by angiography and is large and anatomically normal.  Y Graft SVG-Lat 3rd OM is moderate in size and anatomically normal. Origin lesion, 40 %stenosed.  Ost LAD to Prox LAD lesion, 80 %stenosed. Prox LAD to Mid LAD lesion, 99 %stenosed.  LIMA graft was visualized by angiography and is normal in caliber and anatomically normal.  The left ventricular systolic function is normal. The left ventricular ejection fraction is 55-65% by visual estimate.  LV end diastolic pressure is normal.   No obvious culprit lesions found. Clearly has small vessel disease involving the nongrafted diagonal branch but this is not a very approachable PCI target. The other potential culprit is the severe disease in the retrograde flow from SVG-RPDA back to the PL system. Is also not PCI target.  Recommend: Optimize medical management  Plan: Return to patient's room after sheath removal  Discharge plan per primary team.   _____________   History of  Present Illness     71 yo male with PMH of  CAD s/p CABG 2000, HTN, HL (intolerant of statins/ezetimibe) now on trial with bempedoic acid, HTN and CKD 3 (Creatinine 1.5-1.6) admitted for NSTEMI. Has done very well since CABG. Has not neeed repeat cath. Was having episodes of CP last year and saw Jory Sims NP underwent echo and stress testing last year.   06/2015 completed echo which showed normal LVEF. Exercise MPI he did 8 min 25 sec with no EKG changes and no ischemia by imaging.   Last seen by Dr. Harl Bowie on 07/23/16 in Clinic and was not having any CP.   Over past 2-3 weeks has been having indigestion. Not relieved with PPI. Mild relief with Mylanta. Was at grandson's baseball game 08/22/16 and had worsening chest pressure and some diaphoresis. Seemed to get better with walking but would't go away. Pain different than previous angina in 2000. Presented to the [facility] ER 08/22/16 with waxing and waning CP. ECG normal. Trop 0.14. CP resolved with sl NTG. Transferred to [facility] for cath. He was continued on ASA and heparin. Started on low dose BB. Noted to be intolerant of statins.   Hospital Course     Troponin peaked at 0.23. He underwent cardiac cath with Dr. Ellyn Hack on 08/23/16 noted above, showing patent grafts with small vessel disease. No culprit lesion noted. Will add low dose Imdur to medication regimen. He felt well post cath. Right femoral cath site was stable. Metoprolol 12.5mg  BID was added this admission. He was asked to hold his amlodipine and HCTZ at  discharge. Considered the addition of Imdur but was instructed to resume his amlodipine if his blood pressure elevates at home for antianginal therapy.    He was determined stable for discharge home. Follow up arranged in the office. Currently in a research study for statin therapy. He was instructed on groin site care prior to discharge. Medications are listed below.  _____________  Discharge Vitals Blood pressure 102/62, pulse (!) 55,  temperature 98.6 F (37 C), temperature source Oral, resp. rate 18, height 5\' 10"  (1.778 m), weight 179 lb 4.8 oz (81.3 kg), SpO2 97 %.  Filed Weights   08/22/16 1943 08/22/16 2237  Weight: 180 lb (81.6 kg) 179 lb 4.8 oz (81.3 kg)    Labs & Radiologic Studies    CBC  Recent Labs  08/22/16 2010 08/23/16 0508  WBC 8.8 6.3  NEUTROABS 6.4  --   HGB 14.6 13.1  HCT 41.4 39.8  MCV 94.7 95.4  PLT 200 865   Basic Metabolic Panel  Recent Labs  08/22/16 2010 08/23/16 0508  NA 139 140  K 3.6 3.7  CL 102 102  CO2 28 30  GLUCOSE 112* 113*  BUN 19 15  CREATININE 1.65* 1.58*  CALCIUM 10.0 9.3   Liver Function Tests No results for input(s): AST, ALT, ALKPHOS, BILITOT, PROT, ALBUMIN in the last 72 hours. No results for input(s): LIPASE, AMYLASE in the last 72 hours. Cardiac Enzymes  Recent Labs  08/22/16 2010 08/22/16 2305 08/23/16 0508  TROPONINI 0.14* 0.15* 0.23*   BNP Invalid input(s): POCBNP D-Dimer  Recent Labs  08/22/16 2010  DDIMER <0.27   Hemoglobin A1C No results for input(s): HGBA1C in the last 72 hours. Fasting Lipid Panel  Recent Labs  08/23/16 0508  CHOL 193  HDL 40*  LDLCALC 133*  TRIG 100  CHOLHDL 4.8   Thyroid Function Tests No results for input(s): TSH, T4TOTAL, T3FREE, THYROIDAB in the last 72 hours.  Invalid input(s): FREET3 _____________  Dg Chest 2 View  Result Date: 08/22/2016 CLINICAL DATA:  Severe chest pain with right arm paresthesias for 3 hours. Coronary artery disease. EXAM: CHEST  2 VIEW COMPARISON:  06/21/2013 FINDINGS: The heart size and mediastinal contours are within normal limits. Prior CABG noted. Both lungs are clear. Small hiatal hernia is seen. The visualized skeletal structures are unremarkable. IMPRESSION: No active cardiopulmonary disease. Small hiatal hernia. Electronically Signed   By: Earle Gell M.D.   On: 08/22/2016 21:01   Disposition   Pt is being discharged home today in good condition.  Follow-up Plans  & Appointments    Follow-up Information    [facility] Follow up on 09/03/2016.   Specialty:  Cardiology Why:  at 2:30pm for your follow up appt with Kerin Ransom PA with Dr. Vita Erm information: [address] [place] [phone]         Discharge Instructions    Call MD for:  redness, tenderness, or signs of infection (pain, swelling, redness, odor or green/yellow discharge around incision site)    Complete by:  As directed    Diet - low sodium heart healthy    Complete by:  As directed    Discharge instructions    Complete by:  As directed    Groin Site Care Refer to this sheet in the next few weeks. These instructions provide you with information on caring for yourself after your procedure. Your caregiver may also give you more specific instructions. Your treatment has been planned according  to current medical practices, but problems sometimes occur. Call your caregiver if you have any problems or questions after your procedure. HOME CARE INSTRUCTIONS You may shower 24 hours after the procedure. Remove the bandage (dressing) and gently wash the site with plain soap and water. Gently pat the site dry.  Do not apply powder or lotion to the site.  Do not sit in a bathtub, swimming pool, or whirlpool for 5 to 7 days.  No bending, squatting, or lifting anything over 10 pounds (4.5 kg) as directed by your caregiver.  Inspect the site at least twice daily.  Do not drive home if you are discharged the same day of the procedure. Have someone else drive you.  You may drive 24 hours after the procedure unless otherwise instructed by your caregiver.  What to expect: Any bruising will usually fade within 1 to 2 weeks.  Blood that collects in the tissue (hematoma) may be painful to the touch. It should usually decrease in size and tenderness within 1 to 2 weeks.  SEEK IMMEDIATE MEDICAL CARE IF: You have unusual pain at the groin site or down the  affected leg.  You have redness, warmth, swelling, or pain at the groin site.  You have drainage (other than a small amount of blood on the dressing).  You have chills.  You have a fever or persistent symptoms for more than 72 hours.  You have a fever and your symptoms suddenly get worse.  Your leg becomes pale, cool, tingly, or numb.  You have heavy bleeding from the site. Hold pressure on the site. .  We added a new medication called metoprolol this admission. Please stop taking both the amlodipine and HCTZ for now. If your blood pressures run high at home, systolic (top) number >825 then please restart the amlodipine. You have a follow up arranged in the [place] office.   Increase activity slowly    Complete by:  As directed       Discharge Medications   Current Discharge Medication List    START taking these medications   Details  metoprolol tartrate (LOPRESSOR) 25 MG tablet Take 0.5 tablets (12.5 mg total) by mouth 2 (two) times daily. Qty: 60 tablet, Refills: 3      CONTINUE these medications which have NOT CHANGED   Details  acetaminophen (TYLENOL) 325 MG tablet Take 650 mg by mouth every 6 (six) hours as needed for moderate pain.    aspirin 81 MG EC tablet Take 81 mg by mouth at bedtime.     finasteride (PROSCAR) 5 MG tablet Take 5 mg by mouth every evening.    hyoscyamine (LEVSIN SL) 0.125 MG SL tablet Place 1 tablet (0.125 mg total) under the tongue 4 (four) times daily. Before meals and at bedtime as needed for loose stools Qty: 120 tablet, Refills: 3    Investigational - Study Medication Take 1 tablet by mouth every morning. Study name:Bempedoic Acid 180mg  Additional study details:Cholesterol medication    omeprazole (PRILOSEC) 20 MG capsule Take 20 mg by mouth 2 (two) times daily. Before LUNCH and Before SUPPER    Probiotic Product (PROBIOTIC DAILY PO) Take 1 capsule by mouth daily.     psyllium (METAMUCIL) 58.6 % powder Take 1 packet by mouth daily.      tamsulosin (FLOMAX) 0.4 MG CAPS capsule Take 0.4 mg by mouth at bedtime.      STOP taking these medications     amLODipine (NORVASC) 10 MG tablet  hydrochlorothiazide (HYDRODIURIL) 25 MG tablet           Outstanding Labs/Studies   N/A  Duration of Discharge Encounter   Greater than 30 minutes including physician time.  Signed, Reino Bellis NP-C 08/23/2016, 3:36 PM  Patient seen, examined. Available data reviewed. Agree with findings, assessment, and plan as outlined by Reino Bellis, NP-C. My exam today: Vitals:   08/23/16 1329 08/23/16 1400  BP: 110/67 102/62  Pulse:  (!) 55  Resp:  18  Temp:     Pt is alert and oriented, NAD HEENT: normal Neck: JVP - normal Lungs: CTA bilaterally CV: RRR without murmur or gallop Abd: soft, NT, Positive BS, no hepatomegaly Ext: no C/C/E, groin site clear Skin: warm/dry no rash  Cath data reviewed. Beta-blocker added to his medical regimen. Otherwise would continue current Rx. Good prognostic sign that all of his grafts are patent. OK for discharge today to FU with Dr Harl Bowie.  Sherren Mocha, M.D. 08/23/2016 3:51 PM

## 2016-08-23 NOTE — Care Management Note (Signed)
Case Management Note  Patient Details  Name: [name] MRN: [id number] Date of Birth: 03/02/1946  Subjective/Objective:                 Transferred from [place] for NSTEMI. No CM needs identified at DC.    Action/Plan:  DC to home.  Expected Discharge Date:  08/23/16               Expected Discharge Plan:  Home/Self Care  In-House Referral:     Discharge planning Services  CM Consult  Post Acute Care Choice:    Choice offered to:     DME Arranged:    DME Agency:     HH Arranged:    HH Agency:     Status of Service:  Completed, signed off  If discussed at [employer] of Stay Meetings, dates discussed:    Additional Comments:  Carles Collet, RN 08/23/2016, 4:35 PM

## 2016-08-23 NOTE — Interval H&P Note (Signed)
History and Physical Interval Note:  08/23/2016 9:31 AM  Alik R Mcmillen  has presented today for surgery, with the diagnosis of unstable angina w/ Troponin +.   The various methods of treatment have been discussed with the patient and family. After consideration of risks, benefits and other options for treatment, the patient has consented to  Procedure(s): Left Heart Cath and Cors/Grafts Angiography (N/A) with possible Percutaneous Coronary Intervention as a surgical intervention .  The patient's history has been reviewed, patient examined, no change in status, stable for surgery.  I have reviewed the patient's chart and labs.  Questions were answered to the patient's satisfaction.     Cath Lab Visit (complete for each Cath Lab visit)  Clinical Evaluation Leading to the Procedure:   ACS: Yes.    Non-ACS:    Anginal Classification: CCS IV  Anti-ischemic medical therapy: Maximal Therapy (2 or more classes of medications)  Non-Invasive Test Results: No non-invasive testing performed  Prior CABG: Previous CABG  Glenetta Hew

## 2016-08-23 NOTE — Progress Notes (Addendum)
Site area: RFA Site Prior to Removal:  Level 0 Pressure Applied For:20 min Manual:   yes Patient Status During Pull:stable   Post Pull Site:  Level 0 Post Pull Instructions Given:  yes Post Pull Pulses Present: palpable Dressing Applied:  tegaderm Bedrest begins @ 8472 till 1505 Comments:

## 2016-08-24 LAB — HEMOGLOBIN A1C
HEMOGLOBIN A1C: 6 % — AB (ref 4.8–5.6)
MEAN PLASMA GLUCOSE: 126 mg/dL

## 2016-08-28 ENCOUNTER — Telehealth (HOSPITAL_COMMUNITY): Payer: Self-pay | Admitting: *Deleted

## 2016-08-30 DIAGNOSIS — H35342 Macular cyst, hole, or pseudohole, left eye: Secondary | ICD-10-CM | POA: Diagnosis not present

## 2016-08-30 DIAGNOSIS — H40023 Open angle with borderline findings, high risk, bilateral: Secondary | ICD-10-CM | POA: Diagnosis not present

## 2016-09-03 ENCOUNTER — Encounter: Payer: Self-pay | Admitting: Cardiology

## 2016-09-03 ENCOUNTER — Ambulatory Visit (INDEPENDENT_AMBULATORY_CARE_PROVIDER_SITE_OTHER): Payer: Medicare Other | Admitting: Cardiology

## 2016-09-03 VITALS — BP 134/80 | HR 68 | Ht 70.0 in | Wt 181.0 lb

## 2016-09-03 DIAGNOSIS — Z951 Presence of aortocoronary bypass graft: Secondary | ICD-10-CM | POA: Diagnosis not present

## 2016-09-03 DIAGNOSIS — I214 Non-ST elevation (NSTEMI) myocardial infarction: Secondary | ICD-10-CM

## 2016-09-03 DIAGNOSIS — N183 Chronic kidney disease, stage 3 unspecified: Secondary | ICD-10-CM

## 2016-09-03 DIAGNOSIS — E785 Hyperlipidemia, unspecified: Secondary | ICD-10-CM | POA: Diagnosis not present

## 2016-09-03 DIAGNOSIS — I2510 Atherosclerotic heart disease of native coronary artery without angina pectoris: Secondary | ICD-10-CM

## 2016-09-03 MED ORDER — ISOSORBIDE MONONITRATE ER 30 MG PO TB24: ORAL_TABLET | ORAL | 3 refills | Status: DC

## 2016-09-03 NOTE — Progress Notes (Signed)
09/03/2016 [name]   1945-11-11  [id number]  Primary Physician Sharilyn Sites, MD Primary Cardiologist: Dr Harl Bowie  HPI:  71 y/o male with h/o CAD s/p CABG x 4 2000, HTN, HL (intolerant of statins/ezetimibe) now on trial with bempedoic acid, HTN and CKD 3 (Creatinine 1.5-1.6) admitted for NSTEMI 08/22/16. Cath done showed patent grafts with distal disease and medical rx recommended. He is in the office today for follow up. He has had a few episodes of exertional chest pain, a couple have been when he was walking uphill. They are relieved with rest.    Current Outpatient Prescriptions  Medication Sig Dispense Refill  . acetaminophen (TYLENOL) 325 MG tablet Take 650 mg by mouth every 6 (six) hours as needed for moderate pain.    Marland Kitchen amLODipine (NORVASC) 10 MG tablet Take 5 mg by mouth daily.    Marland Kitchen aspirin 81 MG EC tablet Take 81 mg by mouth at bedtime.     . finasteride (PROSCAR) 5 MG tablet Take 5 mg by mouth every evening.    . hyoscyamine (LEVSIN SL) 0.125 MG SL tablet Place 1 tablet (0.125 mg total) under the tongue 4 (four) times daily. Before meals and at bedtime as needed for loose stools 120 tablet 3  . Investigational - Study Medication Take 1 tablet by mouth every morning. Study name:Bempedoic Acid 180mg  Additional study details:Cholesterol medication    . metoprolol tartrate (LOPRESSOR) 25 MG tablet Take 0.5 tablets (12.5 mg total) by mouth 2 (two) times daily. 60 tablet 3  . omeprazole (PRILOSEC) 20 MG capsule Take 20 mg by mouth 2 (two) times daily. Before LUNCH and Before SUPPER    . Probiotic Product (PROBIOTIC DAILY PO) Take 1 capsule by mouth daily.     . psyllium (METAMUCIL) 58.6 % powder Take 1 packet by mouth daily.    . tamsulosin (FLOMAX) 0.4 MG CAPS capsule Take 0.4 mg by mouth at bedtime.    . isosorbide mononitrate (IMDUR) 30 MG 24 hr tablet Take 15 mg Daily for One Week then Increase to 30 mg Daily 90 tablet 3   No current facility-administered medications for  this visit.     Allergies  Allergen Reactions  . Statins Other (See Comments)    Severe muscle pain  . Codeine Nausea And Vomiting  . Demerol [Meperidine] Nausea And Vomiting  . Nalbuphine Nausea And Vomiting    Past Medical History:  Diagnosis Date  . Arteriosclerotic cardiovascular disease (ASCVD)    CABG in 08/1998  (LIMA-mLAD, SVG-rPDA, SVG-OM3-with Y SVG-lateral OM3.)  . BPH (benign prostatic hypertrophy)    h/x of prostatis/ UTI in 2006  . Chronic kidney disease, stage II (mild) 2010   Creatinine of 1.5 in 2010  . GERD (gastroesophageal reflux disease)    s/p espohageal dilatatin and repair of hiatal hernia  . Hyperlipidemia   . Hypertension   . IBS (irritable bowel syndrome)   . PONV (postoperative nausea and vomiting)   . Sigmoid diverticulosis     Social History   Social History  . Marital status: Married    Spouse name: N/A  . Number of children: N/A  . Years of education: N/A   Occupational History  . 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 Smoking status: Never Smoker  . Smokeless tobacco: Never Used     Comment: no use of tobacco products  . Alcohol use 1.0 oz/week    2 Standard drinks or equivalent per week  Comment: "Occasionally"  . Drug use: No  . Sexual activity: Yes    Partners: Female   Other Topics Concern  . Not on file   Social History Narrative   Previously  Employed as an Recruitment consultant.      Family History  Problem Relation Age of Onset  . Coronary artery disease Father        also grandfather  . Colon cancer Neg Hx      Review of Systems: General: negative for chills, fever, night sweats or weight changes.  Cardiovascular: negative for dyspnea on exertion, edema, orthopnea, palpitations, paroxysmal nocturnal dyspnea or shortness of breath Dermatological: negative for rash Respiratory: negative for cough or wheezing Urologic: negative for hematuria Abdominal: negative for nausea,  vomiting, diarrhea, bright red blood per rectum, melena, or hematemesis Neurologic: negative for visual changes, syncope, or dizziness All other systems reviewed and are otherwise negative except as noted above.    Blood pressure 134/80, pulse 68, height 5\' 10"  (1.778 m), weight 181 lb (82.1 kg), SpO2 98 %.  General appearance: alert, cooperative and no distress Neck: no carotid bruit and no JVD Lungs: clear to auscultation bilaterally Heart: regular rate and rhythm Extremities: extremities normal, atraumatic, no cyanosis or edema Neurologic: Grossly normal   ASSESSMENT AND PLAN:   NSTEMI (non-ST elevated myocardial infarction) ([place]) NSTEMI 08/22/16-medical rx  Hx of CABG CABG x 4 in 08/1998, cath 08/22/16- patent grafts, some distal CAD  CRI (chronic renal insufficiency), stage 3 (moderate) Creat-1.45 in 2010; 09/2009:1.42; 03/2012:1.48, 08/23/16- 1.58  Dyslipidemia Statin intolerant   PLAN  I suggested the pt start Imdur 15 mg daily and increase to 30 mg daily after one week. He'll f/u with dr Harl Bowie in a few weeks.  Note: he was taking Norvasc 5 mg BID and Lopressor 12.5 mg BID and will continue this.   Kerin Ransom PA-C 09/03/2016 4:17 PM

## 2016-09-03 NOTE — Patient Instructions (Signed)
Your physician recommends that you schedule a follow-up appointment in: 6-8 Weeks with Dr. Harl Bowie   Your physician has recommended you make the following change in your medication:   Start Imdur 15 mg Daily for one week then increase to 30 mg Daily.   If you need a refill on your cardiac medications before your next appointment, please call your pharmacy.  Thank you for choosing [place]!

## 2016-09-03 NOTE — Assessment & Plan Note (Signed)
CABG x 4 in 08/1998, cath 08/22/16- patent grafts, some distal CAD

## 2016-09-03 NOTE — Assessment & Plan Note (Signed)
Creat-1.45 in 2010; 09/2009:1.42; 03/2012:1.48, 08/23/16- 1.58

## 2016-09-03 NOTE — Assessment & Plan Note (Signed)
Statin intolerant 

## 2016-09-03 NOTE — Assessment & Plan Note (Signed)
NSTEMI 08/22/16-medical rx

## 2016-10-30 ENCOUNTER — Ambulatory Visit (INDEPENDENT_AMBULATORY_CARE_PROVIDER_SITE_OTHER): Payer: Medicare Other | Admitting: Cardiology

## 2016-10-30 ENCOUNTER — Encounter: Payer: Self-pay | Admitting: Cardiology

## 2016-10-30 VITALS — BP 151/80 | HR 55 | Ht 70.0 in | Wt 180.0 lb

## 2016-10-30 DIAGNOSIS — I2510 Atherosclerotic heart disease of native coronary artery without angina pectoris: Secondary | ICD-10-CM

## 2016-10-30 DIAGNOSIS — E782 Mixed hyperlipidemia: Secondary | ICD-10-CM | POA: Diagnosis not present

## 2016-10-30 DIAGNOSIS — I10 Essential (primary) hypertension: Secondary | ICD-10-CM

## 2016-10-30 NOTE — Patient Instructions (Signed)
Your physician recommends that you schedule a follow-up appointment in: 4 MONTHS WITH DR BRANCH  Your physician recommends that you continue on your current medications as directed. Please refer to the Current Medication list given to you today.  Thank you for choosing [facility]!!    

## 2016-10-30 NOTE — Progress Notes (Signed)
Clinical Summary [name] is a 71 y.o.male seen today for follow up of the following medical problems.   1. CAD - CABG in 2000 - last visit with NP Lawerence reported episodes of chest pain - 06/2015 completed echo which showed normal LVEF. Exercise MPI he did 8 min 25 sec with no EKG changes and no ischemia by imaging.  - has not been on ACE-I since prior admission with AKI, had also been on NSAIDs at that time.   - admit 08/2016 with NSTEMI, cath as reported below, no interventional targets, managed medically. Imdur added to medical regimen and lopressor 12.5mg  bid added. - no chest pain, no SOB - compliant with meds    2. HTN - home bp's 120s/60s - he stopped amlodopine due to low bp's after starting imdur.    3. Hyperlipidemia - leg pains on vytorin. Did not tolerate low dose pravastatin.  - he enrolled in clinical trial for cholesterol medication. Bempedoic acid trial.      Past Medical History:  Diagnosis Date  . Arteriosclerotic cardiovascular disease (ASCVD)    CABG in 08/1998  (LIMA-mLAD, SVG-rPDA, SVG-OM3-with Y SVG-lateral OM3.)  . BPH (benign prostatic hypertrophy)    h/x of prostatis/ UTI in 2006  . Chronic kidney disease, stage II (mild) 2010   Creatinine of 1.5 in 2010  . GERD (gastroesophageal reflux disease)    s/p espohageal dilatatin and repair of hiatal hernia  . Hyperlipidemia   . Hypertension   . IBS (irritable bowel syndrome)   . PONV (postoperative nausea and vomiting)   . Sigmoid diverticulosis      Allergies  Allergen Reactions  . Statins Other (See Comments)    Severe muscle pain  . Codeine Nausea And Vomiting  . Demerol [Meperidine] Nausea And Vomiting  . Nalbuphine Nausea And Vomiting     Current Outpatient Prescriptions  Medication Sig Dispense Refill  . acetaminophen (TYLENOL) 325 MG tablet Take 650 mg by mouth every 6 (six) hours as needed for moderate pain.    Marland Kitchen amLODipine (NORVASC) 10 MG tablet Take 5 mg by mouth  daily.    Marland Kitchen aspirin 81 MG EC tablet Take 81 mg by mouth at bedtime.     . finasteride (PROSCAR) 5 MG tablet Take 5 mg by mouth every evening.    . hyoscyamine (LEVSIN SL) 0.125 MG SL tablet Place 1 tablet (0.125 mg total) under the tongue 4 (four) times daily. Before meals and at bedtime as needed for loose stools 120 tablet 3  . Investigational - Study Medication Take 1 tablet by mouth every morning. Study name:Bempedoic Acid 180mg  Additional study details:Cholesterol medication    . isosorbide mononitrate (IMDUR) 30 MG 24 hr tablet Take 15 mg Daily for One Week then Increase to 30 mg Daily 90 tablet 3  . metoprolol tartrate (LOPRESSOR) 25 MG tablet Take 0.5 tablets (12.5 mg total) by mouth 2 (two) times daily. 60 tablet 3  . omeprazole (PRILOSEC) 20 MG capsule Take 20 mg by mouth 2 (two) times daily. Before LUNCH and Before SUPPER    . Probiotic Product (PROBIOTIC DAILY PO) Take 1 capsule by mouth daily.     . psyllium (METAMUCIL) 58.6 % powder Take 1 packet by mouth daily.    . tamsulosin (FLOMAX) 0.4 MG CAPS capsule Take 0.4 mg by mouth at bedtime.     No current facility-administered medications for this visit.      Past Surgical History:  Procedure Laterality Date  .  BREAST BIOPSY Right 08/17/2013   Procedure: RIGHT BREAST BIOPSY;  Surgeon: [name], MD;  Location: [facility];  Service: General;  Laterality: Right;  . CHOLECYSTECTOMY  1990s  . COLONOSCOPY  03/17/2012   Rourk; colonic diverticulosis, repeat screening colonoscopy in 10 years.  . CORONARY ARTERY BYPASS GRAFT  2000  . ESOPHAGOGASTRODUODENOSCOPY  May 2009   Rourk: Food impaction, critical esophageal ring/stricture requiring dilation  . HEMORRHOID SURGERY  2007   total of three  . HIATAL HERNIA REPAIR  1995  . LEFT HEART CATH AND CORS/GRAFTS ANGIOGRAPHY N/A 08/23/2016   Procedure: Left Heart Cath and Cors/Grafts Angiography;  Surgeon: Leonie Man, MD;  Location: [facility];  Service: Cardiovascular;   Laterality: N/A;  . LUMBAR LAMINECTOMY  1999     Allergies  Allergen Reactions  . Statins Other (See Comments)    Severe muscle pain  . Codeine Nausea And Vomiting  . Demerol [Meperidine] Nausea And Vomiting  . Nalbuphine Nausea And Vomiting      Family History  Problem Relation Age of Onset  . Coronary artery disease Father        also grandfather  . Colon cancer Neg Hx      Social History [name] reports that he has never smoked. He has never used smokeless tobacco. [name] reports that he drinks about 1.0 oz of alcohol per week .   Review of Systems CONSTITUTIONAL: No weight loss, fever, chills, weakness or fatigue.  HEENT: Eyes: No visual loss, blurred vision, double vision or yellow sclerae.No hearing loss, sneezing, congestion, runny nose or sore throat.  SKIN: No rash or itching.  CARDIOVASCULAR: per hpi RESPIRATORY: No shortness of breath, cough or sputum.  GASTROINTESTINAL: No anorexia, nausea, vomiting or diarrhea. No abdominal pain or blood.  GENITOURINARY: No burning on urination, no polyuria NEUROLOGICAL: No headache, dizziness, syncope, paralysis, ataxia, numbness or tingling in the extremities. No change in bowel or bladder control.  MUSCULOSKELETAL: No muscle, back pain, joint pain or stiffness.  LYMPHATICS: No enlarged nodes. No history of splenectomy.  PSYCHIATRIC: No history of depression or anxiety.  ENDOCRINOLOGIC: No reports of sweating, cold or heat intolerance. No polyuria or polydipsia.  Marland Kitchen   Physical Examination Vitals:   10/30/16 1508  BP: (!) 151/80  Pulse: (!) 55   Vitals:   10/30/16 1508  Weight: 180 lb (81.6 kg)  Height: 5\' 10"  (1.778 m)    Gen: resting comfortably, no acute distress HEENT: no scleral icterus, pupils equal round and reactive, no palptable cervical adenopathy,  CV: RR,no mrg, no jvd Resp: Clear to auscultation bilaterally GI: abdomen is soft, non-tender, non-distended, normal bowel sounds, no  hepatosplenomegaly MSK: extremities are warm, no edema.  Skin: warm, no rash Neuro:  no focal deficits Psych: appropriate affect   Diagnostic Studies 06/2015 echo Study Conclusions  - Left ventricle: The cavity size was normal. Wall thickness was  increased increased in a pattern of mild to moderate LVH.  Systolic function was normal. The estimated ejection fraction was  in the range of 60% to 65%. Wall motion was normal; there were no  regional wall motion abnormalities. Left ventricular diastolic  function parameters were normal. - Aortic valve: Mildly calcified annulus. Trileaflet; mildly  thickened leaflets. Valve area (VTI): 2.58 cm^2. Valve area  (Vmax): 2.89 cm^2. - Mitral valve: There was mild regurgitation. - Systemic veins: Small IVC, suggesting low RA pressures and  hypovolemia. - Technically adequate study.   06/2015 Exercise Stress MPI  Blood pressure demonstrated a hypertensive response to exercise.  The study is normal.  This is a low risk study.  The left ventricular ejection fraction is normal (55-65%).   08/2016 cath  Mid RCA lesion, 80 %stenosed. Dist RCA lesion, 100 %stenosed.  SVG-mRPDA graft was visualized by angiography and is large and anatomically normal.  Potential Culpril (not PCI Target). Ost RPDA lesion, 90 %stenosed, Ost RPDA to RPDA lesion, 70 %stenosed.- filled retrograde from SVG  Ost 3rd Mrg to 3rd Mrg lesion, 80 %stenosed.  SVG-3rd OM graft was visualized by angiography and is large and anatomically normal.  Y Graft SVG-Lat 3rd OM is moderate in size and anatomically normal. Origin lesion, 40 %stenosed.  Ost LAD to Prox LAD lesion, 80 %stenosed. Prox LAD to Mid LAD lesion, 99 %stenosed.  LIMA graft was visualized by angiography and is normal in caliber and anatomically normal.  The left ventricular systolic function is normal. The left ventricular ejection fraction is 55-65% by visual estimate.  LV end diastolic  pressure is normal.   No obvious culprit lesions found. Clearly has small vessel disease involving the nongrafted diagonal Jolissa Kapral but this is not a very approachable PCI target. The other potential culprit is the severe disease in the retrograde flow from SVG-RPDA back to the PL system. Is also not PCI target.  Recommend: Optimize medical management  Plan: Return to patient's room after sheath removal Assessment and Plan   1. CAD - no recent symptoms - continue current meds  2. HTN - elevated in clinic, home numbers have been at goal. If home numbers elevate, would restart his norvasc.  - continue current therapy.  3. Hyperlipidemia - has not tolerated statins - currently enrolled in nonstatin cholesterol medication trial   F/u 4 months     Arnoldo Lenis, M.D.,

## 2016-12-27 DIAGNOSIS — H35342 Macular cyst, hole, or pseudohole, left eye: Secondary | ICD-10-CM | POA: Diagnosis not present

## 2017-01-16 DIAGNOSIS — N401 Enlarged prostate with lower urinary tract symptoms: Secondary | ICD-10-CM | POA: Diagnosis not present

## 2017-01-22 ENCOUNTER — Ambulatory Visit (INDEPENDENT_AMBULATORY_CARE_PROVIDER_SITE_OTHER): Payer: Medicare Other | Admitting: Urology

## 2017-01-22 DIAGNOSIS — N401 Enlarged prostate with lower urinary tract symptoms: Secondary | ICD-10-CM | POA: Diagnosis not present

## 2017-01-22 DIAGNOSIS — R351 Nocturia: Secondary | ICD-10-CM

## 2017-01-22 DIAGNOSIS — N5201 Erectile dysfunction due to arterial insufficiency: Secondary | ICD-10-CM

## 2017-02-18 DIAGNOSIS — M5442 Lumbago with sciatica, left side: Secondary | ICD-10-CM | POA: Diagnosis not present

## 2017-02-18 DIAGNOSIS — M9903 Segmental and somatic dysfunction of lumbar region: Secondary | ICD-10-CM | POA: Diagnosis not present

## 2017-02-18 DIAGNOSIS — M47816 Spondylosis without myelopathy or radiculopathy, lumbar region: Secondary | ICD-10-CM | POA: Diagnosis not present

## 2017-02-19 ENCOUNTER — Other Ambulatory Visit: Payer: Self-pay | Admitting: Cardiology

## 2017-02-19 DIAGNOSIS — M47816 Spondylosis without myelopathy or radiculopathy, lumbar region: Secondary | ICD-10-CM | POA: Diagnosis not present

## 2017-02-19 DIAGNOSIS — M9903 Segmental and somatic dysfunction of lumbar region: Secondary | ICD-10-CM | POA: Diagnosis not present

## 2017-02-19 DIAGNOSIS — M5442 Lumbago with sciatica, left side: Secondary | ICD-10-CM | POA: Diagnosis not present

## 2017-02-19 NOTE — Telephone Encounter (Signed)
This is a [facility] [place] pt.  °

## 2017-02-20 DIAGNOSIS — M5442 Lumbago with sciatica, left side: Secondary | ICD-10-CM | POA: Diagnosis not present

## 2017-02-20 DIAGNOSIS — M9903 Segmental and somatic dysfunction of lumbar region: Secondary | ICD-10-CM | POA: Diagnosis not present

## 2017-02-20 DIAGNOSIS — M47816 Spondylosis without myelopathy or radiculopathy, lumbar region: Secondary | ICD-10-CM | POA: Diagnosis not present

## 2017-02-21 DIAGNOSIS — M5442 Lumbago with sciatica, left side: Secondary | ICD-10-CM | POA: Diagnosis not present

## 2017-02-21 DIAGNOSIS — M47816 Spondylosis without myelopathy or radiculopathy, lumbar region: Secondary | ICD-10-CM | POA: Diagnosis not present

## 2017-02-21 DIAGNOSIS — M9903 Segmental and somatic dysfunction of lumbar region: Secondary | ICD-10-CM | POA: Diagnosis not present

## 2017-02-26 DIAGNOSIS — E663 Overweight: Secondary | ICD-10-CM | POA: Diagnosis not present

## 2017-02-26 DIAGNOSIS — Z0001 Encounter for general adult medical examination with abnormal findings: Secondary | ICD-10-CM | POA: Diagnosis not present

## 2017-02-26 DIAGNOSIS — Z1389 Encounter for screening for other disorder: Secondary | ICD-10-CM | POA: Diagnosis not present

## 2017-02-26 DIAGNOSIS — R7309 Other abnormal glucose: Secondary | ICD-10-CM | POA: Diagnosis not present

## 2017-02-26 DIAGNOSIS — I2510 Atherosclerotic heart disease of native coronary artery without angina pectoris: Secondary | ICD-10-CM | POA: Diagnosis not present

## 2017-02-26 DIAGNOSIS — Z6828 Body mass index (BMI) 28.0-28.9, adult: Secondary | ICD-10-CM | POA: Diagnosis not present

## 2017-02-26 DIAGNOSIS — E782 Mixed hyperlipidemia: Secondary | ICD-10-CM | POA: Diagnosis not present

## 2017-02-26 DIAGNOSIS — I10 Essential (primary) hypertension: Secondary | ICD-10-CM | POA: Diagnosis not present

## 2017-02-27 ENCOUNTER — Other Ambulatory Visit: Payer: Self-pay

## 2017-02-27 ENCOUNTER — Ambulatory Visit (INDEPENDENT_AMBULATORY_CARE_PROVIDER_SITE_OTHER): Payer: Medicare Other | Admitting: Cardiology

## 2017-02-27 ENCOUNTER — Encounter: Payer: Self-pay | Admitting: Cardiology

## 2017-02-27 VITALS — BP 158/80 | HR 67 | Ht 70.0 in | Wt 183.0 lb

## 2017-02-27 DIAGNOSIS — I10 Essential (primary) hypertension: Secondary | ICD-10-CM

## 2017-02-27 DIAGNOSIS — E782 Mixed hyperlipidemia: Secondary | ICD-10-CM

## 2017-02-27 DIAGNOSIS — I2510 Atherosclerotic heart disease of native coronary artery without angina pectoris: Secondary | ICD-10-CM | POA: Diagnosis not present

## 2017-02-27 MED ORDER — METOPROLOL TARTRATE 25 MG PO TABS: 25.0000 mg | ORAL_TABLET | Freq: Two times a day (BID) | ORAL | 3 refills | Status: DC

## 2017-02-27 MED ORDER — ISOSORBIDE MONONITRATE ER 30 MG PO TB24: 15.0000 mg | ORAL_TABLET | Freq: Every day | ORAL | 3 refills | Status: DC

## 2017-02-27 NOTE — Patient Instructions (Addendum)
Medication Instructions:   Continue the Lopressor at 25mg  twice a day.  Continue the Imdur at 15mg  daily.  Continue all other medications.    Labwork: none  Testing/Procedures: none  Follow-Up: Your physician wants you to follow up in: 6 months.  You will receive a reminder letter in the mail one-two months in advance.  If you don't receive a letter, please call our office to schedule the follow up appointment   Any Other Special Instructions Will Be Listed Below (If Applicable).  If you need a refill on your cardiac medications before your next appointment, please call your pharmacy.

## 2017-02-27 NOTE — Progress Notes (Signed)
Clinical Summary Mr. Roark is a 70 y.o.male seen today for follow up of the following medical problems.   1. CAD - CABG in 2000 - last visit with NP Lawerence reported episodes of chest pain - 06/2015 completed echo which showed normal LVEF. Exercise MPI he did 8 min 25 sec with no EKG changes and no ischemia by imaging.  - has not been on ACE-I since prior admission with AKI, had also been on NSAIDs at that time.   - admit 08/2016 with NSTEMI, cath as reported below, no interventional targets, managed medically. Imdur added to medical regimen and lopressor 12.5mg  bid added.   - no recent chest pain/SOB/DOE - imdur 30 caused headaches, he is back down to 15mg  daily.    2. HTN - home bp's 112-130s/50-70s   3. Hyperlipidemia - leg pains on vytorin. Did not tolerate low dose pravastatin.  - he enrolled in clinical trial for cholesterol medication. Bempedoic acid trial.   4. CKD - followed by nephrology  Past Medical History:  Diagnosis Date  . Arteriosclerotic cardiovascular disease (ASCVD)    CABG in 08/1998  (LIMA-mLAD, SVG-rPDA, SVG-OM3-with Y SVG-lateral OM3.)  . BPH (benign prostatic hypertrophy)    h/x of prostatis/ UTI in 2006  . Chronic kidney disease, stage II (mild) 2010   Creatinine of 1.5 in 2010  . GERD (gastroesophageal reflux disease)    s/p espohageal dilatatin and repair of hiatal hernia  . Hyperlipidemia   . Hypertension   . IBS (irritable bowel syndrome)   . PONV (postoperative nausea and vomiting)   . Sigmoid diverticulosis      Allergies  Allergen Reactions  . Statins Other (See Comments)    Severe muscle pain  . Codeine Nausea And Vomiting  . Demerol [Meperidine] Nausea And Vomiting  . Nalbuphine Nausea And Vomiting     Current Outpatient Medications  Medication Sig Dispense Refill  . acetaminophen (TYLENOL) 325 MG tablet Take 650 mg by mouth every 6 (six) hours as needed for moderate pain.    Marland Kitchen aspirin 81 MG EC tablet Take 81  mg by mouth at bedtime.     . finasteride (PROSCAR) 5 MG tablet Take 5 mg by mouth every evening.    . hyoscyamine (LEVSIN SL) 0.125 MG SL tablet Place 1 tablet (0.125 mg total) under the tongue 4 (four) times daily. Before meals and at bedtime as needed for loose stools 120 tablet 3  . Investigational - Study Medication Take 1 tablet by mouth every morning. Study name:Bempedoic Acid 180mg  Additional study details:Cholesterol medication    . isosorbide mononitrate (IMDUR) 30 MG 24 hr tablet Take 15 mg Daily for One Week then Increase to 30 mg Daily 90 tablet 3  . metoprolol tartrate (LOPRESSOR) 25 MG tablet TAKE 0.5 TABLETS (12.5 MG TOTAL) BY MOUTH 2 (TWO) TIMES DAILY. 60 tablet 3  . omeprazole (PRILOSEC) 20 MG capsule Take 20 mg by mouth 2 (two) times daily. Before LUNCH and Before SUPPER    . Probiotic Product (PROBIOTIC DAILY PO) Take 1 capsule by mouth daily.     . psyllium (METAMUCIL) 58.6 % powder Take 1 packet by mouth daily.    . tamsulosin (FLOMAX) 0.4 MG CAPS capsule Take 0.4 mg by mouth at bedtime.     No current facility-administered medications for this visit.      Past Surgical History:  Procedure Laterality Date  . CHOLECYSTECTOMY  1990s  . CORONARY ARTERY BYPASS GRAFT  2000  . ESOPHAGOGASTRODUODENOSCOPY  May 2009   Rourk: Food impaction, critical esophageal ring/stricture requiring dilation  . HEMORRHOID SURGERY  2007   total of three  . HIATAL HERNIA REPAIR  1995  . LUMBAR LAMINECTOMY  1999     Allergies  Allergen Reactions  . Statins Other (See Comments)    Severe muscle pain  . Codeine Nausea And Vomiting  . Demerol [Meperidine] Nausea And Vomiting  . Nalbuphine Nausea And Vomiting      Family History  Problem Relation Age of Onset  . Coronary artery disease Father        also grandfather  . Colon cancer Neg Hx      Social History Mr. Brue reports that  has never smoked. he has never used smokeless tobacco. Mr. Enyeart reports that he drinks about  1.0 oz of alcohol per week.   Review of Systems CONSTITUTIONAL: No weight loss, fever, chills, weakness or fatigue.  HEENT: Eyes: No visual loss, blurred vision, double vision or yellow sclerae.No hearing loss, sneezing, congestion, runny nose or sore throat.  SKIN: No rash or itching.  CARDIOVASCULAR: per hpi RESPIRATORY: No shortness of breath, cough or sputum.  GASTROINTESTINAL: No anorexia, nausea, vomiting or diarrhea. No abdominal pain or blood.  GENITOURINARY: No burning on urination, no polyuria NEUROLOGICAL: No headache, dizziness, syncope, paralysis, ataxia, numbness or tingling in the extremities. No change in bowel or bladder control.  MUSCULOSKELETAL: No muscle, back pain, joint pain or stiffness.  LYMPHATICS: No enlarged nodes. No history of splenectomy.  PSYCHIATRIC: No history of depression or anxiety.  ENDOCRINOLOGIC: No reports of sweating, cold or heat intolerance. No polyuria or polydipsia.  Marland Kitchen   Physical Examination Vitals:   02/27/17 1253  BP: (!) 158/80  Pulse: 67  SpO2: 97%   Vitals:   02/27/17 1253  Weight: 183 lb (83 kg)  Height: 5\' 10"  (1.778 m)    Gen: resting comfortably, no acute distress HEENT: no scleral icterus, pupils equal round and reactive, no palptable cervical adenopathy,  CV: RRR, no m/r/g, no jvd Resp: Clear to auscultation bilaterally GI: abdomen is soft, non-tender, non-distended, normal bowel sounds, no hepatosplenomegaly MSK: extremities are warm, no edema.  Skin: warm, no rash Neuro:  no focal deficits Psych: appropriate affect   Diagnostic Studies 06/2015 echo Study Conclusions  - Left ventricle: The cavity size was normal. Wall thickness was  increased increased in a pattern of mild to moderate LVH.  Systolic function was normal. The estimated ejection fraction was  in the range of 60% to 65%. Wall motion was normal; there were no  regional wall motion abnormalities. Left ventricular diastolic  function parameters  were normal. - Aortic valve: Mildly calcified annulus. Trileaflet; mildly  thickened leaflets. Valve area (VTI): 2.58 cm^2. Valve area  (Vmax): 2.89 cm^2. - Mitral valve: There was mild regurgitation. - Systemic veins: Small IVC, suggesting low RA pressures and  hypovolemia. - Technically adequate study.   06/2015 Exercise Stress MPI  Blood pressure demonstrated a hypertensive response to exercise.  The study is normal.  This is a low risk study.  The left ventricular ejection fraction is normal (55-65%).   08/2016 cath  Mid RCA lesion, 80 %stenosed. Dist RCA lesion, 100 %stenosed.  SVG-mRPDA graft was visualized by angiography and is large and anatomically normal.  Potential Culpril (not PCI Target). Ost RPDA lesion, 90 %stenosed, Ost RPDA to RPDA lesion, 70 %stenosed.- filled retrograde from SVG  Ost 3rd Mrg to 3rd Mrg lesion, 80 %stenosed.  SVG-3rd OM graft was  visualized by angiography and is large and anatomically normal.  Y Graft SVG-Lat 3rd OM is moderate in size and anatomically normal. Origin lesion, 40 %stenosed.  Ost LAD to Prox LAD lesion, 80 %stenosed. Prox LAD to Mid LAD lesion, 99 %stenosed.  LIMA graft was visualized by angiography and is normal in caliber and anatomically normal.  The left ventricular systolic function is normal. The left ventricular ejection fraction is 55-65% by visual estimate.  LV end diastolic pressure is normal.  No obvious culprit lesions found. Clearly has small vessel disease involving the nongrafted diagonal Mikalia Fessel but this is not a very approachable PCI target. The other potential culprit is the severe disease in the retrograde flow from SVG-RPDA back to the PL system. Is also not PCI target.  Recommend: Optimize medical management      Assessment and Plan  1. CAD - asymptomatic, conitnue current meds  2. HTN - elevated in clinic, home numbers have been at goal historically and continue to be so - continue  current meds  3. Hyperlipidemia - has not tolerated statins - currently enrolled in nonstatin cholesterol medication trial      Arnoldo Lenis, M.D.

## 2017-03-01 ENCOUNTER — Ambulatory Visit: Payer: Medicare Other | Admitting: Cardiology

## 2017-03-03 ENCOUNTER — Encounter: Payer: Self-pay | Admitting: Cardiology

## 2017-04-23 DIAGNOSIS — E875 Hyperkalemia: Secondary | ICD-10-CM | POA: Diagnosis not present

## 2017-04-23 DIAGNOSIS — N183 Chronic kidney disease, stage 3 (moderate): Secondary | ICD-10-CM | POA: Diagnosis not present

## 2017-04-23 DIAGNOSIS — I10 Essential (primary) hypertension: Secondary | ICD-10-CM | POA: Diagnosis not present

## 2017-05-06 ENCOUNTER — Other Ambulatory Visit (HOSPITAL_COMMUNITY): Payer: Self-pay | Admitting: Nephrology

## 2017-05-06 DIAGNOSIS — N183 Chronic kidney disease, stage 3 unspecified: Secondary | ICD-10-CM

## 2017-05-06 DIAGNOSIS — N289 Disorder of kidney and ureter, unspecified: Secondary | ICD-10-CM

## 2017-05-28 ENCOUNTER — Ambulatory Visit (HOSPITAL_COMMUNITY)
Admission: RE | Admit: 2017-05-28 | Discharge: 2017-05-28 | Disposition: A | Discharge status: Home / Self Care | Payer: Medicare Other | Source: Ambulatory Visit | Attending: Nephrology | Admitting: Nephrology

## 2017-05-28 DIAGNOSIS — E559 Vitamin D deficiency, unspecified: Secondary | ICD-10-CM | POA: Diagnosis not present

## 2017-05-28 DIAGNOSIS — N281 Cyst of kidney, acquired: Secondary | ICD-10-CM | POA: Insufficient documentation

## 2017-05-28 DIAGNOSIS — Z1159 Encounter for screening for other viral diseases: Secondary | ICD-10-CM | POA: Diagnosis not present

## 2017-05-28 DIAGNOSIS — Z79899 Other long term (current) drug therapy: Secondary | ICD-10-CM | POA: Diagnosis not present

## 2017-05-28 DIAGNOSIS — N183 Chronic kidney disease, stage 3 unspecified: Secondary | ICD-10-CM

## 2017-05-28 DIAGNOSIS — D509 Iron deficiency anemia, unspecified: Secondary | ICD-10-CM | POA: Diagnosis not present

## 2017-05-28 DIAGNOSIS — I10 Essential (primary) hypertension: Secondary | ICD-10-CM | POA: Diagnosis not present

## 2017-05-28 DIAGNOSIS — R809 Proteinuria, unspecified: Secondary | ICD-10-CM | POA: Diagnosis not present

## 2017-06-11 DIAGNOSIS — I10 Essential (primary) hypertension: Secondary | ICD-10-CM | POA: Diagnosis not present

## 2017-06-11 DIAGNOSIS — M9080 Osteopathy in diseases classified elsewhere, unspecified site: Secondary | ICD-10-CM | POA: Diagnosis not present

## 2017-06-11 DIAGNOSIS — E889 Metabolic disorder, unspecified: Secondary | ICD-10-CM | POA: Diagnosis not present

## 2017-06-11 DIAGNOSIS — N183 Chronic kidney disease, stage 3 (moderate): Secondary | ICD-10-CM | POA: Diagnosis not present

## 2017-06-11 DIAGNOSIS — N400 Enlarged prostate without lower urinary tract symptoms: Secondary | ICD-10-CM | POA: Diagnosis not present

## 2017-07-19 DIAGNOSIS — M1711 Unilateral primary osteoarthritis, right knee: Secondary | ICD-10-CM | POA: Diagnosis not present

## 2017-07-23 DIAGNOSIS — N401 Enlarged prostate with lower urinary tract symptoms: Secondary | ICD-10-CM | POA: Diagnosis not present

## 2017-07-25 ENCOUNTER — Other Ambulatory Visit: Payer: Self-pay | Admitting: Physician Assistant

## 2017-07-25 DIAGNOSIS — C44719 Basal cell carcinoma of skin of left lower limb, including hip: Secondary | ICD-10-CM | POA: Diagnosis not present

## 2017-07-25 DIAGNOSIS — L820 Inflamed seborrheic keratosis: Secondary | ICD-10-CM | POA: Diagnosis not present

## 2017-07-25 DIAGNOSIS — D485 Neoplasm of uncertain behavior of skin: Secondary | ICD-10-CM | POA: Diagnosis not present

## 2017-07-25 DIAGNOSIS — L570 Actinic keratosis: Secondary | ICD-10-CM | POA: Diagnosis not present

## 2017-07-25 DIAGNOSIS — C44219 Basal cell carcinoma of skin of left ear and external auricular canal: Secondary | ICD-10-CM | POA: Diagnosis not present

## 2017-07-30 ENCOUNTER — Ambulatory Visit (INDEPENDENT_AMBULATORY_CARE_PROVIDER_SITE_OTHER): Payer: Medicare Other | Admitting: Urology

## 2017-07-30 DIAGNOSIS — N5201 Erectile dysfunction due to arterial insufficiency: Secondary | ICD-10-CM | POA: Diagnosis not present

## 2017-07-30 DIAGNOSIS — R972 Elevated prostate specific antigen [PSA]: Secondary | ICD-10-CM

## 2017-07-30 DIAGNOSIS — R351 Nocturia: Secondary | ICD-10-CM

## 2017-07-30 DIAGNOSIS — N401 Enlarged prostate with lower urinary tract symptoms: Secondary | ICD-10-CM | POA: Diagnosis not present

## 2017-08-13 ENCOUNTER — Telehealth: Payer: Self-pay | Admitting: Cardiology

## 2017-08-13 DIAGNOSIS — W57XXXA Bitten or stung by nonvenomous insect and other nonvenomous arthropods, initial encounter: Secondary | ICD-10-CM | POA: Diagnosis not present

## 2017-08-13 DIAGNOSIS — R7309 Other abnormal glucose: Secondary | ICD-10-CM | POA: Diagnosis not present

## 2017-08-13 DIAGNOSIS — R42 Dizziness and giddiness: Secondary | ICD-10-CM | POA: Diagnosis not present

## 2017-08-13 DIAGNOSIS — S40869A Insect bite (nonvenomous) of unspecified upper arm, initial encounter: Secondary | ICD-10-CM | POA: Diagnosis not present

## 2017-08-13 DIAGNOSIS — Z1389 Encounter for screening for other disorder: Secondary | ICD-10-CM | POA: Diagnosis not present

## 2017-08-13 DIAGNOSIS — E663 Overweight: Secondary | ICD-10-CM | POA: Diagnosis not present

## 2017-08-13 DIAGNOSIS — Z6827 Body mass index (BMI) 27.0-27.9, adult: Secondary | ICD-10-CM | POA: Diagnosis not present

## 2017-08-13 DIAGNOSIS — I10 Essential (primary) hypertension: Secondary | ICD-10-CM | POA: Diagnosis not present

## 2017-08-13 DIAGNOSIS — N189 Chronic kidney disease, unspecified: Secondary | ICD-10-CM | POA: Diagnosis not present

## 2017-08-13 NOTE — Telephone Encounter (Signed)
Say HR has been in the 50s - c/o dizziness/lightheadedness - off balance says pcp ruled out vertigo and advised pt to contact us - denies chest pain/SOB/swelling - says BP has been running high (170s/80s) the entire day today - says he has been checking it daily and this was the first day it has been high - says dizziness has been going on since Wednesday of last week. Is flying to [place] on Thursday and wanted to know if he should be seen before 5/15 for regular f/u

## 2017-08-13 NOTE — Telephone Encounter (Signed)
Bp's of 170s/80s would not cause any symptoms. If he is having heart rates in the 50s that may cause some dizziness. Is his dizziness only when he stands up? Any reason he may be dehydrated? (poor oral intake, diarrhea?). How much water a day does he drink?  Lets lower his lopressor to 12.5mg  bid and follow symptoms. Is he leaving this Thursday or next Thursday?   Carlyle Dolly MD

## 2017-08-13 NOTE — Telephone Encounter (Signed)
Pt says dizziness has been all the time - has been doing a lot of [place] hunting this week and walking - pt says he drinks "about a quart of water daily" - denies any diarrhea - leaves this comming Thursday for [place] - will decrease Lopressor to 12.25 mg bid - pt voiced understanding - update medication list

## 2017-08-13 NOTE — Telephone Encounter (Signed)
Patient just left his PCP and was advised to contact Dr Harl Bowie about it being high this morning with light headiness in their office.  BP at   177/80  He will be going out town next week and would like to have address or see Dr Harl Bowie if possible

## 2017-08-16 ENCOUNTER — Other Ambulatory Visit: Payer: Self-pay | Admitting: Cardiology

## 2017-08-28 ENCOUNTER — Encounter: Payer: Self-pay | Admitting: *Deleted

## 2017-08-28 ENCOUNTER — Ambulatory Visit (INDEPENDENT_AMBULATORY_CARE_PROVIDER_SITE_OTHER): Payer: Medicare Other | Admitting: Cardiology

## 2017-08-28 ENCOUNTER — Encounter: Payer: Self-pay | Admitting: Cardiology

## 2017-08-28 ENCOUNTER — Other Ambulatory Visit: Payer: Self-pay

## 2017-08-28 VITALS — BP 150/78 | HR 52 | Ht 70.0 in | Wt 180.0 lb

## 2017-08-28 DIAGNOSIS — I10 Essential (primary) hypertension: Secondary | ICD-10-CM | POA: Diagnosis not present

## 2017-08-28 DIAGNOSIS — E782 Mixed hyperlipidemia: Secondary | ICD-10-CM

## 2017-08-28 DIAGNOSIS — R42 Dizziness and giddiness: Secondary | ICD-10-CM | POA: Diagnosis not present

## 2017-08-28 DIAGNOSIS — I2510 Atherosclerotic heart disease of native coronary artery without angina pectoris: Secondary | ICD-10-CM | POA: Diagnosis not present

## 2017-08-28 NOTE — Progress Notes (Signed)
Clinical Summary Mr. Gentle is a 72 y.o.male seen today for follow up of the following medical problems.   1. CAD - CABG in 2000 - 06/2015 completed echo which showed normal LVEF. Exercise MPI he did 8 min 25 sec with no EKG changes and no ischemia by imaging.  - has not been on ACE-I since prior admission with AKI, had also been on NSAIDs at that time.   - admit 08/2016 with NSTEMI, cath as reported below, no interventional targets, managed medically. Imdur added to medical regimen and lopressor 12.5mg  bid added.   - no recent chest pain/SOB/DOE - imdur 30 caused headaches, he is back down to 15mg  daily.  - lisinopril off due to prior kidney troubles.   2. HTN - home bp's 112-130s/50-70s - he is compliant with meds   3. Hyperlipidemia - leg pains on vytorin. Did not tolerate low dose pravastatin.  - he enrolled in clinical trial for cholesterol medication. Bempedoic acid trial.   4. CKD - followed by nephrology  5. Dizziness - by phone note issues with dizzienss/lightheadedness at home. BPs 170/80s, HRs 50s.  - we lowered lopressor to 12.5mg  bid.   - symptoms only with standing and walking. Could last up to 1 hour while on feet. No other symptoms - no n/v/d, maintaining adequate hydration. Had some elevated bp's around the same time 170s/80s - since that time bp 130-140s/70s-   Heart rates in low 50s. On lower dose of lorpressor into 39s.   - symptoms improved since medication changes.    Past Medical History:  Diagnosis Date  . Arteriosclerotic cardiovascular disease (ASCVD)    CABG in 08/1998  (LIMA-mLAD, SVG-rPDA, SVG-OM3-with Y SVG-lateral OM3.)  . BPH (benign prostatic hypertrophy)    h/x of prostatis/ UTI in 2006  . Chronic kidney disease, stage II (mild) 2010   Creatinine of 1.5 in 2010  . GERD (gastroesophageal reflux disease)    s/p espohageal dilatatin and repair of hiatal hernia  . Hyperlipidemia   . Hypertension   . IBS (irritable bowel  syndrome)   . PONV (postoperative nausea and vomiting)   . Sigmoid diverticulosis      Allergies  Allergen Reactions  . Statins Other (See Comments)    Severe muscle pain  . Codeine Nausea And Vomiting  . Demerol [Meperidine] Nausea And Vomiting  . Nalbuphine Nausea And Vomiting     Current Outpatient Medications  Medication Sig Dispense Refill  . acetaminophen (TYLENOL) 325 MG tablet Take 650 mg by mouth every 6 (six) hours as needed for moderate pain.    Marland Kitchen aspirin 81 MG EC tablet Take 81 mg by mouth at bedtime.     . finasteride (PROSCAR) 5 MG tablet Take 5 mg by mouth every evening.    . Investigational - Study Medication Take 1 tablet by mouth every morning. Study name:Bempedoic Acid 180mg  Additional study details:Cholesterol medication    . isosorbide mononitrate (IMDUR) 30 MG 24 hr tablet Take 0.5 tablets (15 mg total) daily by mouth. 45 tablet 3  . isosorbide mononitrate (IMDUR) 30 MG 24 hr tablet Take 15 mg Daily 45 tablet 1  . metoprolol tartrate (LOPRESSOR) 25 MG tablet Take 12.5 mg by mouth 2 (two) times daily.    Marland Kitchen omeprazole (PRILOSEC) 20 MG capsule Take 20 mg by mouth 2 (two) times daily. Before LUNCH and Before SUPPER    . Probiotic Product (PROBIOTIC DAILY PO) Take 1 capsule by mouth daily.     Marland Kitchen  psyllium (METAMUCIL) 58.6 % powder Take 1 packet by mouth daily.    . tamsulosin (FLOMAX) 0.4 MG CAPS capsule Take 0.4 mg by mouth at bedtime.     No current facility-administered medications for this visit.      Past Surgical History:  Procedure Laterality Date  . BREAST BIOPSY Right 08/17/2013   Procedure: RIGHT BREAST BIOPSY;  Surgeon: [name], MD;  Location: [facility];  Service: General;  Laterality: Right;  . CHOLECYSTECTOMY  1990s  . COLONOSCOPY  03/17/2012   Rourk; colonic diverticulosis, repeat screening colonoscopy in 10 years.  . CORONARY ARTERY BYPASS GRAFT  2000  . ESOPHAGOGASTRODUODENOSCOPY  May 2009   Rourk: Food impaction, critical esophageal  ring/stricture requiring dilation  . HEMORRHOID SURGERY  2007   total of three  . HIATAL HERNIA REPAIR  1995  . LEFT HEART CATH AND CORS/GRAFTS ANGIOGRAPHY N/A 08/23/2016   Procedure: Left Heart Cath and Cors/Grafts Angiography;  Surgeon: Leonie Man, MD;  Location: [facility];  Service: Cardiovascular;  Laterality: N/A;  . LUMBAR LAMINECTOMY  1999     Allergies  Allergen Reactions  . Statins Other (See Comments)    Severe muscle pain  . Codeine Nausea And Vomiting  . Demerol [Meperidine] Nausea And Vomiting  . Nalbuphine Nausea And Vomiting      Family History  Problem Relation Age of Onset  . Coronary artery disease Father        also grandfather  . Colon cancer Neg Hx      Social History Mr. Bredeson reports that he has never smoked. He has never used smokeless tobacco. Mr. Zabriskie reports that he drinks about 1.0 oz of alcohol per week.   Review of Systems CONSTITUTIONAL: No weight loss, fever, chills, weakness or fatigue.  HEENT: Eyes: No visual loss, blurred vision, double vision or yellow sclerae.No hearing loss, sneezing, congestion, runny nose or sore throat.  SKIN: No rash or itching.  CARDIOVASCULAR: per hpi RESPIRATORY: No shortness of breath, cough or sputum.  GASTROINTESTINAL: No anorexia, nausea, vomiting or diarrhea. No abdominal pain or blood.  GENITOURINARY: No burning on urination, no polyuria NEUROLOGICAL: per hpi  MUSCULOSKELETAL: No muscle, back pain, joint pain or stiffness.  LYMPHATICS: No enlarged nodes. No history of splenectomy.  PSYCHIATRIC: No history of depression or anxiety.  ENDOCRINOLOGIC: No reports of sweating, cold or heat intolerance. No polyuria or polydipsia.  Marland Kitchen   Physical Examination Vitals:   08/28/17 1111  BP: (!) 150/78  Pulse: (!) 52  SpO2: 99%   Vitals:   08/28/17 1111  Weight: 180 lb (81.6 kg)  Height: 5\' 10"  (1.778 m)    Gen: resting comfortably, no acute distress HEENT: no scleral icterus, pupils  equal round and reactive, no palptable cervical adenopathy,  CV: RRR, no m/r/g, no jvd Resp: Clear to auscultation bilaterally GI: abdomen is soft, non-tender, non-distended, normal bowel sounds, no hepatosplenomegaly MSK: extremities are warm, no edema.  Skin: warm, no rash Neuro:  no focal deficits Psych: appropriate affect   Diagnostic Studies 06/2015 echo Study Conclusions  - Left ventricle: The cavity size was normal. Wall thickness was  increased increased in a pattern of mild to moderate LVH.  Systolic function was normal. The estimated ejection fraction was  in the range of 60% to 65%. Wall motion was normal; there were no  regional wall motion abnormalities. Left ventricular diastolic  function parameters were normal. - Aortic valve: Mildly calcified annulus. Trileaflet; mildly  thickened leaflets. Valve area (  VTI): 2.58 cm^2. Valve area  (Vmax): 2.89 cm^2. - Mitral valve: There was mild regurgitation. - Systemic veins: Small IVC, suggesting low RA pressures and  hypovolemia. - Technically adequate study.   06/2015 Exercise Stress MPI  Blood pressure demonstrated a hypertensive response to exercise.  The study is normal.  This is a low risk study.  The left ventricular ejection fraction is normal (55-65%).   08/2016 cath  Mid RCA lesion, 80 %stenosed. Dist RCA lesion, 100 %stenosed.  SVG-mRPDA graft was visualized by angiography and is large and anatomically normal.  Potential Culpril (not PCI Target). Ost RPDA lesion, 90 %stenosed, Ost RPDA to RPDA lesion, 70 %stenosed.- filled retrograde from SVG  Ost 3rd Mrg to 3rd Mrg lesion, 80 %stenosed.  SVG-3rd OM graft was visualized by angiography and is large and anatomically normal.  Y Graft SVG-Lat 3rd OM is moderate in size and anatomically normal. Origin lesion, 40 %stenosed.  Ost LAD to Prox LAD lesion, 80 %stenosed. Prox LAD to Mid LAD lesion, 99 %stenosed.  LIMA graft was visualized by  angiography and is normal in caliber and anatomically normal.  The left ventricular systolic function is normal. The left ventricular ejection fraction is 55-65% by visual estimate.  LV end diastolic pressure is normal.  No obvious culprit lesions found. Clearly has small vessel disease involving the nongrafted diagonal Jakylan Ron but this is not a very approachable PCI target. The other potential culprit is the severe disease in the retrograde flow from SVG-RPDA back to the PL system. Is also not PCI target.  Recommend: Optimize medical management      Assessment and Plan  1. CAD - no recent symptoms, continue current meds  2. HTN - elevated in clinic, home numbers have been at goal historically - recent decrease in meds due to dizziness.   3. Hyperlipidemia - has not tolerated statins - currently enrolled in nonstatin cholesterol medication trial  4. Dizziness - improved with lower dose of lopressor, continue to monitor.  -orthostatics negative today - EKG shows sinus brady to 48, continue lopressor for now given his CAD and prior MI, however is symptomatic again will need to d/c.  F/u 4 months   Arnoldo Lenis, M.D.

## 2017-08-28 NOTE — Patient Instructions (Signed)
Your physician recommends that you schedule a follow-up appointment in: 4 MONTHS WITH DR BRANCH  Your physician recommends that you continue on your current medications as directed. Please refer to the Current Medication list given to you today.  Thank you for choosing [facility]!!    

## 2017-09-02 ENCOUNTER — Encounter: Payer: Self-pay | Admitting: Cardiology

## 2017-09-23 ENCOUNTER — Telehealth: Payer: Self-pay | Admitting: *Deleted

## 2017-09-23 NOTE — Telephone Encounter (Signed)
Pt c/o dizziness and lightheadedness/weakness and dull headache - says hasn't improved since 5/15 OV - currently taking Lopressor 12.5 mg bid and has also cut back on Imdur says he has cut pill in 1/4 - says BP has been 140s/70s HR ranging from 50s-60s

## 2017-09-23 NOTE — Telephone Encounter (Signed)
Pt made aware and will update [place] on Thursday

## 2017-09-23 NOTE — Telephone Encounter (Signed)
Stop lopressor all together, update [place] on symptoms on Thursday.   Zandra Abts MD

## 2017-09-24 DIAGNOSIS — M47812 Spondylosis without myelopathy or radiculopathy, cervical region: Secondary | ICD-10-CM | POA: Diagnosis not present

## 2017-09-24 DIAGNOSIS — M9902 Segmental and somatic dysfunction of thoracic region: Secondary | ICD-10-CM | POA: Diagnosis not present

## 2017-09-24 DIAGNOSIS — M546 Pain in thoracic spine: Secondary | ICD-10-CM | POA: Diagnosis not present

## 2017-09-24 DIAGNOSIS — M9903 Segmental and somatic dysfunction of lumbar region: Secondary | ICD-10-CM | POA: Diagnosis not present

## 2017-09-24 DIAGNOSIS — M9901 Segmental and somatic dysfunction of cervical region: Secondary | ICD-10-CM | POA: Diagnosis not present

## 2017-09-24 DIAGNOSIS — S338XXA Sprain of other parts of lumbar spine and pelvis, initial encounter: Secondary | ICD-10-CM | POA: Diagnosis not present

## 2017-09-25 DIAGNOSIS — M9901 Segmental and somatic dysfunction of cervical region: Secondary | ICD-10-CM | POA: Diagnosis not present

## 2017-09-25 DIAGNOSIS — M9903 Segmental and somatic dysfunction of lumbar region: Secondary | ICD-10-CM | POA: Diagnosis not present

## 2017-09-25 DIAGNOSIS — S338XXA Sprain of other parts of lumbar spine and pelvis, initial encounter: Secondary | ICD-10-CM | POA: Diagnosis not present

## 2017-09-25 DIAGNOSIS — M47812 Spondylosis without myelopathy or radiculopathy, cervical region: Secondary | ICD-10-CM | POA: Diagnosis not present

## 2017-09-25 DIAGNOSIS — M9902 Segmental and somatic dysfunction of thoracic region: Secondary | ICD-10-CM | POA: Diagnosis not present

## 2017-09-25 DIAGNOSIS — M546 Pain in thoracic spine: Secondary | ICD-10-CM | POA: Diagnosis not present

## 2017-09-26 ENCOUNTER — Telehealth: Payer: Self-pay | Admitting: *Deleted

## 2017-09-26 DIAGNOSIS — R42 Dizziness and giddiness: Secondary | ICD-10-CM

## 2017-09-26 NOTE — Telephone Encounter (Signed)
Can we order a 24 hour holter for dizziness. His heart rates were low last vist in clinic, I was hoping they would improve off the metoprolol. If by chance he is having episodes low heart rates at home that could explain his symptoms. Continue to stay well hydrated. We are ok with higher bp's for the time being until this dizziness resolves   Zandra Abts MD

## 2017-09-26 NOTE — Telephone Encounter (Signed)
Pt f/u from 6/10 phone note holding metoprolol -  Says he is still dizzy and light headed - BP Tuesday was 156/80 - Wednesday 148/75 HR 62 - today is 168/77 HR 63 - did go to the chiropractor yesterday for neck pain and headache which helped with those symptoms but still feeling dizzy and lightheaded

## 2017-09-26 NOTE — Telephone Encounter (Signed)
Pt will come Monday for holter monitor - has appts tomorrow with Dr Hilma Favors and chiropractor (office closes at 70)

## 2017-09-27 ENCOUNTER — Telehealth: Payer: Self-pay | Admitting: *Deleted

## 2017-09-27 DIAGNOSIS — M9903 Segmental and somatic dysfunction of lumbar region: Secondary | ICD-10-CM | POA: Diagnosis not present

## 2017-09-27 DIAGNOSIS — Z6828 Body mass index (BMI) 28.0-28.9, adult: Secondary | ICD-10-CM | POA: Diagnosis not present

## 2017-09-27 DIAGNOSIS — M47812 Spondylosis without myelopathy or radiculopathy, cervical region: Secondary | ICD-10-CM | POA: Diagnosis not present

## 2017-09-27 DIAGNOSIS — S338XXA Sprain of other parts of lumbar spine and pelvis, initial encounter: Secondary | ICD-10-CM | POA: Diagnosis not present

## 2017-09-27 DIAGNOSIS — T07XXXA Unspecified multiple injuries, initial encounter: Secondary | ICD-10-CM | POA: Diagnosis not present

## 2017-09-27 DIAGNOSIS — W57XXXD Bitten or stung by nonvenomous insect and other nonvenomous arthropods, subsequent encounter: Secondary | ICD-10-CM | POA: Diagnosis not present

## 2017-09-27 DIAGNOSIS — M546 Pain in thoracic spine: Secondary | ICD-10-CM | POA: Diagnosis not present

## 2017-09-27 DIAGNOSIS — M9901 Segmental and somatic dysfunction of cervical region: Secondary | ICD-10-CM | POA: Diagnosis not present

## 2017-09-27 DIAGNOSIS — M9902 Segmental and somatic dysfunction of thoracic region: Secondary | ICD-10-CM | POA: Diagnosis not present

## 2017-09-27 DIAGNOSIS — E663 Overweight: Secondary | ICD-10-CM | POA: Diagnosis not present

## 2017-09-27 MED ORDER — AMLODIPINE BESYLATE 2.5 MG PO TABS: 2.5000 mg | ORAL_TABLET | Freq: Every day | ORAL | 1 refills | Status: DC

## 2017-09-27 NOTE — Telephone Encounter (Signed)
Pt stopped by office after seeing Dr Hilma Favors - has Lyme disease Dr Hilma Favors seems to think this is what was causing symptoms - was started on antibiotics and prednisone - BP today was 160-90 - wanted to know if he still needs to come in for monitor and also if he start back on metoprolol

## 2017-09-27 NOTE — Telephone Encounter (Signed)
Can hold off on heart monitor, though lyme disease actually can cause low heart rates. Continue to monitor his rates at home, if has rates consistently in 40s would need to consider monitior. I would stay off metoprolol at this time due to his low heart rates. For his high bp's can start alternative medicine that doesn't affect heart rates, would start norvasc 2.5mg  daily. Update [place] on bps in 2 weeks   Zandra Abts MD

## 2017-09-27 NOTE — Telephone Encounter (Signed)
Pt aware and voiced understanding - Amlodipine 2.5 mg sent to [facility] - pt will continue to monitor BP/HR and call us in 2 weeks if not sooner for lower HR - cx holter appt

## 2017-10-01 DIAGNOSIS — Z79899 Other long term (current) drug therapy: Secondary | ICD-10-CM | POA: Diagnosis not present

## 2017-10-01 DIAGNOSIS — E559 Vitamin D deficiency, unspecified: Secondary | ICD-10-CM | POA: Diagnosis not present

## 2017-10-01 DIAGNOSIS — R809 Proteinuria, unspecified: Secondary | ICD-10-CM | POA: Diagnosis not present

## 2017-10-01 DIAGNOSIS — N183 Chronic kidney disease, stage 3 (moderate): Secondary | ICD-10-CM | POA: Diagnosis not present

## 2017-10-01 DIAGNOSIS — I10 Essential (primary) hypertension: Secondary | ICD-10-CM | POA: Diagnosis not present

## 2017-10-01 DIAGNOSIS — D509 Iron deficiency anemia, unspecified: Secondary | ICD-10-CM | POA: Diagnosis not present

## 2017-10-08 DIAGNOSIS — I10 Essential (primary) hypertension: Secondary | ICD-10-CM | POA: Diagnosis not present

## 2017-10-08 DIAGNOSIS — E875 Hyperkalemia: Secondary | ICD-10-CM | POA: Diagnosis not present

## 2017-10-08 DIAGNOSIS — N183 Chronic kidney disease, stage 3 (moderate): Secondary | ICD-10-CM | POA: Diagnosis not present

## 2017-10-09 DIAGNOSIS — C44219 Basal cell carcinoma of skin of left ear and external auricular canal: Secondary | ICD-10-CM | POA: Diagnosis not present

## 2017-10-11 ENCOUNTER — Telehealth: Payer: Self-pay | Admitting: *Deleted

## 2017-10-11 NOTE — Telephone Encounter (Signed)
Im ok with those numbers, continue current meds for now   Zandra Abts MD

## 2017-10-11 NOTE — Telephone Encounter (Signed)
Per 6/14 phone note pt called with BP/HR readings - denies any new or worsening symptoms - says he still has occasional dizziness but this has improved - has been compliant on taking amlodipine 2.5 mg daily   Date 6/17 BP 135/82 HR 74          6/18        148/73      72          6/19        148/73      61          6/20        146/70      59          6/21        147/86      61          6/22        149/77      56          6/23        147/76      64          6/24        148/82      52          6/25        130/74      57          6/26        136/74      56          6/27        150/76      59          6/28        145/75      69

## 2017-10-11 NOTE — Telephone Encounter (Signed)
LM to return call.

## 2017-10-11 NOTE — Telephone Encounter (Signed)
Pt aware and voiced understanding 

## 2017-11-20 DIAGNOSIS — M1711 Unilateral primary osteoarthritis, right knee: Secondary | ICD-10-CM | POA: Diagnosis not present

## 2017-12-31 ENCOUNTER — Ambulatory Visit (INDEPENDENT_AMBULATORY_CARE_PROVIDER_SITE_OTHER): Payer: Medicare Other | Admitting: Cardiology

## 2017-12-31 ENCOUNTER — Encounter: Payer: Self-pay | Admitting: *Deleted

## 2017-12-31 ENCOUNTER — Encounter: Payer: Self-pay | Admitting: Cardiology

## 2017-12-31 VITALS — BP 173/85 | HR 58 | Ht 70.0 in | Wt 182.0 lb

## 2017-12-31 DIAGNOSIS — E782 Mixed hyperlipidemia: Secondary | ICD-10-CM | POA: Diagnosis not present

## 2017-12-31 DIAGNOSIS — I2510 Atherosclerotic heart disease of native coronary artery without angina pectoris: Secondary | ICD-10-CM

## 2017-12-31 DIAGNOSIS — I10 Essential (primary) hypertension: Secondary | ICD-10-CM | POA: Diagnosis not present

## 2017-12-31 MED ORDER — AMLODIPINE BESYLATE 5 MG PO TABS: 5.0000 mg | ORAL_TABLET | Freq: Every day | ORAL | 1 refills | Status: DC

## 2017-12-31 NOTE — Progress Notes (Signed)
Clinical Summary [name] is a 72 y.o.male seen today for follow up of the following medical problems.   1. CAD - CABG in 2000 - 06/2015 completed echo which showed normal LVEF. Exercise MPI he did 8 min 25 sec with no EKG changes and no ischemia by imaging.  - has not been on ACE-I since prior admission with AKI, had also been on NSAIDs at that time.   - admit 08/2016 with NSTEMI, cath as reported below, no interventional targets, managed medically. Imdur added to medical regimen and lopressor 12.5mg  bid added.   - no recent chest pain, no SOB/DOE. Walks 1 mile daily without troubles, some of it up hill.   2. HTN - metoprolol stopped due to low HR's in 09/2017 - checks bp's at home, 150/70s. Home heart rates low 60s.  - walks 1 mile daily, HRs up to 80s with activity.    3. Hyperlipidemia - leg pains on vytorin. Did not tolerate low dose pravastatin.  - he enrolled in clinical trial for cholesterol medication. Bempedoic acid trial.  4. CKD - followed by nephrology by Dr Hinda Lenis.   5. Dizziness - resolved with treatment of recent diagnosis of lyme disease.     Past Medical History:  Diagnosis Date  . Arteriosclerotic cardiovascular disease (ASCVD)    CABG in 08/1998  (LIMA-mLAD, SVG-rPDA, SVG-OM3-with Y SVG-lateral OM3.)  . BPH (benign prostatic hypertrophy)    h/x of prostatis/ UTI in 2006  . Chronic kidney disease, stage II (mild) 2010   Creatinine of 1.5 in 2010  . GERD (gastroesophageal reflux disease)    s/p espohageal dilatatin and repair of hiatal hernia  . Hyperlipidemia   . Hypertension   . IBS (irritable bowel syndrome)   . PONV (postoperative nausea and vomiting)   . Sigmoid diverticulosis      Allergies  Allergen Reactions  . Statins Other (See Comments)    Severe muscle pain  . Codeine Nausea And Vomiting  . Demerol [Meperidine] Nausea And Vomiting  . Nalbuphine Nausea And Vomiting     Current Outpatient Medications  Medication  Sig Dispense Refill  . acetaminophen (TYLENOL) 325 MG tablet Take 650 mg by mouth every 6 (six) hours as needed for moderate pain.    Marland Kitchen amLODipine (NORVASC) 2.5 MG tablet Take 1 tablet (2.5 mg total) by mouth daily. 90 tablet 1  . aspirin 81 MG EC tablet Take 81 mg by mouth at bedtime.     . finasteride (PROSCAR) 5 MG tablet Take 5 mg by mouth every evening.    . Investigational - Study Medication Take 1 tablet by mouth every morning. Study name:Bempedoic Acid 180mg  Additional study details:Cholesterol medication    . isosorbide mononitrate (IMDUR) 30 MG 24 hr tablet Take 0.5 tablets (15 mg total) daily by mouth. 45 tablet 3  . omeprazole (PRILOSEC) 20 MG capsule Take 20 mg by mouth 2 (two) times daily. Before LUNCH and Before SUPPER    . Probiotic Product (PROBIOTIC DAILY PO) Take 1 capsule by mouth daily.     . psyllium (METAMUCIL) 58.6 % powder Take 1 packet by mouth daily.    . tamsulosin (FLOMAX) 0.4 MG CAPS capsule Take 0.4 mg by mouth at bedtime.     No current facility-administered medications for this visit.      Past Surgical History:  Procedure Laterality Date  . BREAST BIOPSY Right 08/17/2013   Procedure: RIGHT BREAST BIOPSY;  Surgeon: [name], MD;  Location: [facility];  Service: General;  Laterality: Right;  . CHOLECYSTECTOMY  1990s  . COLONOSCOPY  03/17/2012   Rourk; colonic diverticulosis, repeat screening colonoscopy in 10 years.  . CORONARY ARTERY BYPASS GRAFT  2000  . ESOPHAGOGASTRODUODENOSCOPY  May 2009   Rourk: Food impaction, critical esophageal ring/stricture requiring dilation  . HEMORRHOID SURGERY  2007   total of three  . HIATAL HERNIA REPAIR  1995  . LEFT HEART CATH AND CORS/GRAFTS ANGIOGRAPHY N/A 08/23/2016   Procedure: Left Heart Cath and Cors/Grafts Angiography;  Surgeon: Leonie Man, MD;  Location: [facility];  Service: Cardiovascular;  Laterality: N/A;  . LUMBAR LAMINECTOMY  1999     Allergies  Allergen Reactions  . Statins Other (See  Comments)    Severe muscle pain  . Codeine Nausea And Vomiting  . Demerol [Meperidine] Nausea And Vomiting  . Nalbuphine Nausea And Vomiting      Family History  Problem Relation Age of Onset  . Coronary artery disease Father        also grandfather  . Colon cancer Neg Hx      Social History [name] reports that he has never smoked. He has never used smokeless tobacco. [name] reports that he drinks about 2.0 standard drinks of alcohol per week.   Review of Systems CONSTITUTIONAL: No weight loss, fever, chills, weakness or fatigue.  HEENT: Eyes: No visual loss, blurred vision, double vision or yellow sclerae.No hearing loss, sneezing, congestion, runny nose or sore throat.  SKIN: No rash or itching.  CARDIOVASCULAR: per hpi RESPIRATORY: No shortness of breath, cough or sputum.  GASTROINTESTINAL: No anorexia, nausea, vomiting or diarrhea. No abdominal pain or blood.  GENITOURINARY: No burning on urination, no polyuria NEUROLOGICAL: No headache, dizziness, syncope, paralysis, ataxia, numbness or tingling in the extremities. No change in bowel or bladder control.  MUSCULOSKELETAL: No muscle, back pain, joint pain or stiffness.  LYMPHATICS: No enlarged nodes. No history of splenectomy.  PSYCHIATRIC: No history of depression or anxiety.  ENDOCRINOLOGIC: No reports of sweating, cold or heat intolerance. No polyuria or polydipsia.  Marland Kitchen   Physical Examination Vitals:   12/31/17 1113  BP: (!) 173/85  Pulse: (!) 58  SpO2: 99%   Vitals:   12/31/17 1113  Weight: 182 lb (82.6 kg)  Height: 5\' 10"  (1.778 m)    Gen: resting comfortably, no acute distress HEENT: no scleral icterus, pupils equal round and reactive, no palptable cervical adenopathy,  CV: RRR, no mrg, no jvd Resp: Clear to auscultation bilaterally GI: abdomen is soft, non-tender, non-distended, normal bowel sounds, no hepatosplenomegaly MSK: extremities are warm, no edema.  Skin: warm, no rash Neuro:  no focal  deficits Psych: appropriate affect   Diagnostic Studies 06/2015 echo Study Conclusions  - Left ventricle: The cavity size was normal. Wall thickness was  increased increased in a pattern of mild to moderate LVH.  Systolic function was normal. The estimated ejection fraction was  in the range of 60% to 65%. Wall motion was normal; there were no  regional wall motion abnormalities. Left ventricular diastolic  function parameters were normal. - Aortic valve: Mildly calcified annulus. Trileaflet; mildly  thickened leaflets. Valve area (VTI): 2.58 cm^2. Valve area  (Vmax): 2.89 cm^2. - Mitral valve: There was mild regurgitation. - Systemic veins: Small IVC, suggesting low RA pressures and  hypovolemia. - Technically adequate study.   06/2015 Exercise Stress MPI  Blood pressure demonstrated a hypertensive response to exercise.  The study is normal.  This is a  low risk study.  The left ventricular ejection fraction is normal (55-65%).   08/2016 cath  Mid RCA lesion, 80 %stenosed. Dist RCA lesion, 100 %stenosed.  SVG-mRPDA graft was visualized by angiography and is large and anatomically normal.  Potential Culpril (not PCI Target). Ost RPDA lesion, 90 %stenosed, Ost RPDA to RPDA lesion, 70 %stenosed.- filled retrograde from SVG  Ost 3rd Mrg to 3rd Mrg lesion, 80 %stenosed.  SVG-3rd OM graft was visualized by angiography and is large and anatomically normal.  Y Graft SVG-Lat 3rd OM is moderate in size and anatomically normal. Origin lesion, 40 %stenosed.  Ost LAD to Prox LAD lesion, 80 %stenosed. Prox LAD to Mid LAD lesion, 99 %stenosed.  LIMA graft was visualized by angiography and is normal in caliber and anatomically normal.  The left ventricular systolic function is normal. The left ventricular ejection fraction is 55-65% by visual estimate.  LV end diastolic pressure is normal.  No obvious culprit lesions found. Clearly has small vessel disease involving  the nongrafted diagonal Chudney Scheffler but this is not a very approachable PCI target. The other potential culprit is the severe disease in the retrograde flow from SVG-RPDA back to the PL system. Is also not PCI target.  Recommend: Optimize medical management       Assessment and Plan  1. CAD - no symptoms, continue current meds  2. HTN - above goal. His clinic numbers typically run higher than his home numbers. Reported home SBPs in 150s - increase norvasc to 5mg  daily.   3. Hyperlipidemia - has not tolerated statins - currently enrolled in nonstatin cholesterol medication trial  4. Bradycardia - improved off beta blocker, HR 58 today in clinic. COntinue to monitor.   F/u 6 months. Request labs from pcp and from nephrology      Arnoldo Lenis, M.D

## 2017-12-31 NOTE — Patient Instructions (Signed)
Your physician wants you to follow-up in: [place] will receive a reminder letter in the mail two months in advance. If you don't receive a letter, please call our office to schedule the follow-up appointment.  Your physician has recommended you make the following change in your medication:   INCREASE NORVASC 5 MG DAILY  Thank you for choosing [facility]!!

## 2018-01-01 ENCOUNTER — Other Ambulatory Visit: Payer: Self-pay | Admitting: Physician Assistant

## 2018-01-01 DIAGNOSIS — D0461 Carcinoma in situ of skin of right upper limb, including shoulder: Secondary | ICD-10-CM | POA: Diagnosis not present

## 2018-01-01 DIAGNOSIS — C44622 Squamous cell carcinoma of skin of right upper limb, including shoulder: Secondary | ICD-10-CM | POA: Diagnosis not present

## 2018-01-01 DIAGNOSIS — C4492 Squamous cell carcinoma of skin, unspecified: Secondary | ICD-10-CM

## 2018-01-01 DIAGNOSIS — D2239 Melanocytic nevi of other parts of face: Secondary | ICD-10-CM | POA: Diagnosis not present

## 2018-01-01 DIAGNOSIS — L570 Actinic keratosis: Secondary | ICD-10-CM | POA: Diagnosis not present

## 2018-01-01 DIAGNOSIS — D0462 Carcinoma in situ of skin of left upper limb, including shoulder: Secondary | ICD-10-CM | POA: Diagnosis not present

## 2018-01-01 HISTORY — DX: Squamous cell carcinoma of skin, unspecified: C44.92

## 2018-01-28 DIAGNOSIS — N183 Chronic kidney disease, stage 3 (moderate): Secondary | ICD-10-CM | POA: Diagnosis not present

## 2018-02-10 DIAGNOSIS — D509 Iron deficiency anemia, unspecified: Secondary | ICD-10-CM | POA: Diagnosis not present

## 2018-02-10 DIAGNOSIS — R809 Proteinuria, unspecified: Secondary | ICD-10-CM | POA: Diagnosis not present

## 2018-02-10 DIAGNOSIS — Z79899 Other long term (current) drug therapy: Secondary | ICD-10-CM | POA: Diagnosis not present

## 2018-02-10 DIAGNOSIS — I10 Essential (primary) hypertension: Secondary | ICD-10-CM | POA: Diagnosis not present

## 2018-02-10 DIAGNOSIS — E559 Vitamin D deficiency, unspecified: Secondary | ICD-10-CM | POA: Diagnosis not present

## 2018-02-10 DIAGNOSIS — Z1159 Encounter for screening for other viral diseases: Secondary | ICD-10-CM | POA: Diagnosis not present

## 2018-02-10 DIAGNOSIS — N183 Chronic kidney disease, stage 3 (moderate): Secondary | ICD-10-CM | POA: Diagnosis not present

## 2018-02-11 DIAGNOSIS — N183 Chronic kidney disease, stage 3 (moderate): Secondary | ICD-10-CM | POA: Diagnosis not present

## 2018-02-11 DIAGNOSIS — I10 Essential (primary) hypertension: Secondary | ICD-10-CM | POA: Diagnosis not present

## 2018-02-11 DIAGNOSIS — E559 Vitamin D deficiency, unspecified: Secondary | ICD-10-CM | POA: Diagnosis not present

## 2018-03-06 DIAGNOSIS — Z0001 Encounter for general adult medical examination with abnormal findings: Secondary | ICD-10-CM | POA: Diagnosis not present

## 2018-03-06 DIAGNOSIS — R7309 Other abnormal glucose: Secondary | ICD-10-CM | POA: Diagnosis not present

## 2018-03-06 DIAGNOSIS — N183 Chronic kidney disease, stage 3 (moderate): Secondary | ICD-10-CM | POA: Diagnosis not present

## 2018-03-06 DIAGNOSIS — I10 Essential (primary) hypertension: Secondary | ICD-10-CM | POA: Diagnosis not present

## 2018-03-06 DIAGNOSIS — Z6828 Body mass index (BMI) 28.0-28.9, adult: Secondary | ICD-10-CM | POA: Diagnosis not present

## 2018-03-06 DIAGNOSIS — G4700 Insomnia, unspecified: Secondary | ICD-10-CM | POA: Diagnosis not present

## 2018-03-06 DIAGNOSIS — E782 Mixed hyperlipidemia: Secondary | ICD-10-CM | POA: Diagnosis not present

## 2018-03-06 DIAGNOSIS — I2510 Atherosclerotic heart disease of native coronary artery without angina pectoris: Secondary | ICD-10-CM | POA: Diagnosis not present

## 2018-03-06 DIAGNOSIS — Z1389 Encounter for screening for other disorder: Secondary | ICD-10-CM | POA: Diagnosis not present

## 2018-03-12 ENCOUNTER — Other Ambulatory Visit: Payer: Self-pay | Admitting: Cardiology

## 2018-05-29 DIAGNOSIS — N183 Chronic kidney disease, stage 3 (moderate): Secondary | ICD-10-CM | POA: Diagnosis not present

## 2018-05-29 DIAGNOSIS — N3281 Overactive bladder: Secondary | ICD-10-CM | POA: Diagnosis not present

## 2018-05-29 DIAGNOSIS — K5732 Diverticulitis of large intestine without perforation or abscess without bleeding: Secondary | ICD-10-CM | POA: Diagnosis not present

## 2018-05-29 DIAGNOSIS — I2510 Atherosclerotic heart disease of native coronary artery without angina pectoris: Secondary | ICD-10-CM | POA: Diagnosis not present

## 2018-05-29 DIAGNOSIS — Z6827 Body mass index (BMI) 27.0-27.9, adult: Secondary | ICD-10-CM | POA: Diagnosis not present

## 2018-05-30 ENCOUNTER — Encounter (HOSPITAL_COMMUNITY): Payer: Self-pay | Admitting: Emergency Medicine

## 2018-05-30 ENCOUNTER — Other Ambulatory Visit: Payer: Self-pay

## 2018-05-30 ENCOUNTER — Emergency Department (HOSPITAL_COMMUNITY)
Admission: EM | Admit: 2018-05-30 | Discharge: 2018-05-30 | Disposition: A | Discharge status: Home / Self Care | Payer: Medicare Other | Attending: Emergency Medicine | Admitting: Emergency Medicine

## 2018-05-30 ENCOUNTER — Emergency Department (HOSPITAL_COMMUNITY): Payer: Medicare Other

## 2018-05-30 DIAGNOSIS — Z7982 Long term (current) use of aspirin: Secondary | ICD-10-CM | POA: Diagnosis not present

## 2018-05-30 DIAGNOSIS — R1032 Left lower quadrant pain: Secondary | ICD-10-CM | POA: Diagnosis not present

## 2018-05-30 DIAGNOSIS — Z79899 Other long term (current) drug therapy: Secondary | ICD-10-CM | POA: Insufficient documentation

## 2018-05-30 DIAGNOSIS — R1030 Lower abdominal pain, unspecified: Secondary | ICD-10-CM | POA: Diagnosis not present

## 2018-05-30 DIAGNOSIS — Z951 Presence of aortocoronary bypass graft: Secondary | ICD-10-CM | POA: Diagnosis not present

## 2018-05-30 DIAGNOSIS — N182 Chronic kidney disease, stage 2 (mild): Secondary | ICD-10-CM | POA: Diagnosis not present

## 2018-05-30 DIAGNOSIS — I252 Old myocardial infarction: Secondary | ICD-10-CM | POA: Diagnosis not present

## 2018-05-30 DIAGNOSIS — I2510 Atherosclerotic heart disease of native coronary artery without angina pectoris: Secondary | ICD-10-CM | POA: Diagnosis not present

## 2018-05-30 DIAGNOSIS — I129 Hypertensive chronic kidney disease with stage 1 through stage 4 chronic kidney disease, or unspecified chronic kidney disease: Secondary | ICD-10-CM | POA: Insufficient documentation

## 2018-05-30 DIAGNOSIS — N200 Calculus of kidney: Secondary | ICD-10-CM | POA: Diagnosis not present

## 2018-05-30 DIAGNOSIS — N183 Chronic kidney disease, stage 3 (moderate): Secondary | ICD-10-CM | POA: Diagnosis not present

## 2018-05-30 DIAGNOSIS — N3281 Overactive bladder: Secondary | ICD-10-CM | POA: Diagnosis not present

## 2018-05-30 LAB — COMPREHENSIVE METABOLIC PANEL
ALK PHOS: 39 U/L (ref 38–126)
ALT: 22 U/L (ref 0–44)
AST: 24 U/L (ref 15–41)
Albumin: 4.3 g/dL (ref 3.5–5.0)
Anion gap: 8 (ref 5–15)
BILIRUBIN TOTAL: 0.7 mg/dL (ref 0.3–1.2)
BUN: 21 mg/dL (ref 8–23)
CALCIUM: 9.7 mg/dL (ref 8.9–10.3)
CO2: 26 mmol/L (ref 22–32)
Chloride: 104 mmol/L (ref 98–111)
Creatinine, Ser: 1.51 mg/dL — ABNORMAL HIGH (ref 0.61–1.24)
GFR calc Af Amer: 53 mL/min — ABNORMAL LOW (ref >60)
GFR calc non Af Amer: 46 mL/min — ABNORMAL LOW (ref >60)
Glucose, Bld: 102 mg/dL — ABNORMAL HIGH (ref 70–99)
Potassium: 4.2 mmol/L (ref 3.5–5.1)
Sodium: 138 mmol/L (ref 135–145)
Total Protein: 7 g/dL (ref 6.5–8.1)

## 2018-05-30 LAB — URINALYSIS, ROUTINE W REFLEX MICROSCOPIC
Bilirubin Urine: NEGATIVE
Glucose, UA: NEGATIVE mg/dL
Hgb urine dipstick: NEGATIVE
KETONES UR: NEGATIVE mg/dL
Leukocytes,Ua: NEGATIVE
Nitrite: NEGATIVE
Protein, ur: NEGATIVE mg/dL
Specific Gravity, Urine: 1.013 (ref 1.005–1.030)
pH: 6 (ref 5.0–8.0)

## 2018-05-30 LAB — CBC WITH DIFFERENTIAL/PLATELET
Abs Immature Granulocytes: 0.03 10*3/uL (ref 0.00–0.07)
Basophils Absolute: 0.1 10*3/uL (ref 0.0–0.1)
Basophils Relative: 1 %
Eosinophils Absolute: 0.1 10*3/uL (ref 0.0–0.5)
Eosinophils Relative: 1 %
HCT: 44.1 % (ref 39.0–52.0)
HEMOGLOBIN: 14.1 g/dL (ref 13.0–17.0)
Immature Granulocytes: 0 %
Lymphocytes Relative: 27 %
Lymphs Abs: 1.9 10*3/uL (ref 0.7–4.0)
MCH: 31.1 pg (ref 26.0–34.0)
MCHC: 32 g/dL (ref 30.0–36.0)
MCV: 97.4 fL (ref 80.0–100.0)
MONO ABS: 0.5 10*3/uL (ref 0.1–1.0)
MONOS PCT: 8 %
Neutro Abs: 4.6 10*3/uL (ref 1.7–7.7)
Neutrophils Relative %: 63 %
Platelets: 220 10*3/uL (ref 150–400)
RBC: 4.53 MIL/uL (ref 4.22–5.81)
RDW: 12.9 % (ref 11.5–15.5)
WBC: 7.1 10*3/uL (ref 4.0–10.5)
nRBC: 0 % (ref 0.0–0.2)

## 2018-05-30 LAB — LIPASE, BLOOD: Lipase: 22 U/L (ref 11–51)

## 2018-05-30 MED ORDER — TRAMADOL HCL 50 MG PO TABS: 50.0000 mg | ORAL_TABLET | Freq: Four times a day (QID) | ORAL | 0 refills | Status: DC | PRN

## 2018-05-30 MED ORDER — IOHEXOL 300 MG/ML  SOLN
80.0000 mL | Freq: Once | INTRAMUSCULAR | Status: AC | PRN
  Administered 2018-05-30: 80 mL via INTRAVENOUS

## 2018-05-30 MED ORDER — SODIUM CHLORIDE 0.9 % IV BOLUS
1000.0000 mL | Freq: Once | INTRAVENOUS | Status: AC
  Administered 2018-05-30: 1000 mL via INTRAVENOUS

## 2018-05-30 MED ORDER — ONDANSETRON HCL 4 MG/2ML IJ SOLN
4.0000 mg | Freq: Once | INTRAMUSCULAR | Status: AC
  Administered 2018-05-30: 4 mg via INTRAVENOUS
  Filled 2018-05-30: qty 2

## 2018-05-30 MED ORDER — HYDROMORPHONE HCL 1 MG/ML IJ SOLN
1.0000 mg | Freq: Once | INTRAMUSCULAR | Status: AC
  Administered 2018-05-30: 1 mg via INTRAVENOUS
  Filled 2018-05-30: qty 1

## 2018-05-30 NOTE — ED Provider Notes (Signed)
[facility] EMERGENCY DEPARTMENT Provider Note   CSN: [id number] Arrival date & time: 05/30/18  1514     History   Chief Complaint Chief Complaint  Patient presents with  . Abdominal Pain    HPI [name] is a 73 y.o. male.  Patient complains of lower abdominal pain.  It seems to be getting worse.  He was started on Flagyl and doxy for diverticulitis by his doctor  The history is provided by the patient. No language interpreter was used.  Abdominal Pain  Pain location:  LLQ Pain quality: aching   Pain radiates to:  Does not radiate Pain severity:  Moderate Onset quality:  Gradual Timing:  Constant Progression:  Worsening Chronicity:  New Context: not alcohol use   Relieved by:  Nothing Worsened by:  Nothing Ineffective treatments:  None tried Associated symptoms: no chest pain, no cough, no diarrhea, no fatigue and no hematuria     Past Medical History:  Diagnosis Date  . Arteriosclerotic cardiovascular disease (ASCVD)    CABG in 08/1998  (LIMA-mLAD, SVG-rPDA, SVG-OM3-with Y SVG-lateral OM3.)  . BPH (benign prostatic hypertrophy)    h/x of prostatis/ UTI in 2006  . Chronic kidney disease, stage II (mild) 2010   Creatinine of 1.5 in 2010  . GERD (gastroesophageal reflux disease)    s/p espohageal dilatatin and repair of hiatal hernia  . Hyperlipidemia   . Hypertension   . IBS (irritable bowel syndrome)   . PONV (postoperative nausea and vomiting)   . Sigmoid diverticulosis     Patient Active Problem List   Diagnosis Date Noted  . NSTEMI (non-ST elevated myocardial infarction) ([place]) 08/22/2016  . IBS (irritable bowel syndrome) 08/03/2013  . Chest pain on exertion 07/16/2012  . Acute on chronic renal failure ([place]) 07/16/2012  . Hyponatremia 07/16/2012  . Hx of CABG   . GERD (gastroesophageal reflux disease)   . Hypertension   . Dyslipidemia   . BPH (benign prostatic hypertrophy)   . CRI (chronic renal insufficiency), stage 3 (moderate) ([place])  04/05/2009    Past Surgical History:  Procedure Laterality Date  . BREAST BIOPSY Right 08/17/2013   Procedure: RIGHT BREAST BIOPSY;  Surgeon: [name], MD;  Location: [facility];  Service: General;  Laterality: Right;  . CHOLECYSTECTOMY  1990s  . COLONOSCOPY  03/17/2012   Rourk; colonic diverticulosis, repeat screening colonoscopy in 10 years.  . CORONARY ARTERY BYPASS GRAFT  2000  . ESOPHAGOGASTRODUODENOSCOPY  May 2009   Rourk: Food impaction, critical esophageal ring/stricture requiring dilation  . HEMORRHOID SURGERY  2007   total of three  . HIATAL HERNIA REPAIR  1995  . LEFT HEART CATH AND CORS/GRAFTS ANGIOGRAPHY N/A 08/23/2016   Procedure: Left Heart Cath and Cors/Grafts Angiography;  Surgeon: Leonie Man, MD;  Location: [place] [facility];  Service: Cardiovascular;  Laterality: N/A;  . [place] Medications    Prior to Admission medications   Medication Sig Start Date End Date Taking? Authorizing Provider  acetaminophen (TYLENOL) 325 MG tablet Take 650 mg by mouth every 6 (six) hours as needed for moderate pain.   Yes [provider]  amLODipine (NORVASC) 5 MG tablet Take 1 tablet (5 mg total) by mouth daily. 12/31/17 05/30/18 Yes BranchAlphonse Guild, MD  aspirin 81 MG EC tablet Take 81 mg by mouth at bedtime.    Yes [provider]  finasteride (PROSCAR) 5 MG tablet Take 5 mg by  mouth every evening.   Yes [provider]  Investigational - Study Medication Take 1 tablet by mouth every morning. Study name:Bempedoic Acid 180mg  Additional study details:Cholesterol medication   Yes [provider]  isosorbide mononitrate (IMDUR) 30 MG 24 hr tablet Take 0.5 tablets (15 mg total) daily by mouth. 02/27/17  Yes Branch, Alphonse Guild, MD  metroNIDAZOLE (FLAGYL) 500 MG tablet Take 500 mg by mouth 3 (three) times daily.   Yes [provider]  omeprazole (PRILOSEC) 20 MG capsule Take 20 mg by mouth 2 (two) times  daily. Before LUNCH and Before SUPPER   Yes [provider]  Probiotic Product (PROBIOTIC DAILY PO) Take 1 capsule by mouth daily.    Yes [provider]  psyllium (METAMUCIL) 58.6 % powder Take 1 packet by mouth daily.   Yes [provider]  tamsulosin (FLOMAX) 0.4 MG CAPS capsule Take 0.4 mg by mouth at bedtime.   Yes [provider]  Tetrahydrozoline HCl (VISINE OP) Apply 1 drop to eye 2 (two) times daily as needed (dry eyes).   Yes [provider]  zolpidem (AMBIEN) 10 MG tablet Take 5 tablets by mouth at bedtime. 04/26/18  Yes [provider]  amLODipine (NORVASC) 2.5 MG tablet TAKE 1 TABLET BY MOUTH EVERY DAY 03/12/18   Arnoldo Lenis, MD    Family History Family History  Problem Relation Age of Onset  . Coronary artery disease Father        also grandfather  . Colon cancer Neg Hx     Social History Social History   Tobacco Use  . Smoking status: Never Smoker  . Smokeless tobacco: Never Used  . Tobacco comment: no use of tobacco products  Substance Use Topics  . Alcohol use: Yes    Alcohol/week: 2.0 standard drinks    Types: 2 Standard drinks or equivalent per week    Comment: "Occasionally"  . Drug use: No     Allergies   Statins; Codeine; Demerol [meperidine]; and Nalbuphine   Review of Systems Review of Systems  Constitutional: Negative for appetite change and fatigue.  HENT: Negative for congestion, ear discharge and sinus pressure.   Eyes: Negative for discharge.  Respiratory: Negative for cough.   Cardiovascular: Negative for chest pain.  Gastrointestinal: Positive for abdominal pain. Negative for diarrhea.  Genitourinary: Negative for frequency and hematuria.  Musculoskeletal: Negative for back pain.  Skin: Negative for rash.  Neurological: Negative for seizures and headaches.  Psychiatric/Behavioral: Negative for hallucinations.     Physical Exam Updated Vital Signs BP (!) 189/89 (BP  Location: Right Arm)   Pulse 66   Temp 97.9 F (36.6 C) (Temporal)   Resp 14   Ht 5\' 10"  (1.778 m)   Wt 80.7 kg   SpO2 100%   BMI 25.54 kg/m   Physical Exam Vitals signs and nursing note reviewed.  Constitutional:      Appearance: He is well-developed.  HENT:     Head: Normocephalic.     Nose: Nose normal.  Eyes:     General: No scleral icterus.    Conjunctiva/sclera: Conjunctivae normal.  Neck:     Musculoskeletal: Neck supple.     Thyroid: No thyromegaly.  Cardiovascular:     Rate and Rhythm: Normal rate and regular rhythm.     Heart sounds: No murmur. No friction rub. No gallop.   Pulmonary:     Breath sounds: No stridor. No wheezing or rales.  Chest:     Chest wall:  No tenderness.  Abdominal:     General: There is no distension.     Tenderness: There is abdominal tenderness. There is no rebound.     Comments: Mild to moderate tenderness suprapubic and left lower quadrant  Musculoskeletal: Normal range of motion.  Lymphadenopathy:     Cervical: No cervical adenopathy.  Skin:    Findings: No erythema or rash.  Neurological:     Mental Status: He is oriented to person, place, and time.     Motor: No abnormal muscle tone.     Coordination: Coordination normal.  Psychiatric:        Behavior: Behavior normal.      ED Treatments / Results  Labs (all labs ordered are listed, but only abnormal results are displayed) Labs Reviewed  COMPREHENSIVE METABOLIC PANEL - Abnormal; Notable for the following components:      Result Value   Glucose, Bld 102 (*)    Creatinine, Ser 1.51 (*)    GFR calc non Af Amer 46 (*)    GFR calc Af Amer 53 (*)    All other components within normal limits  URINALYSIS, ROUTINE W REFLEX MICROSCOPIC - Abnormal; Notable for the following components:   Color, Urine STRAW (*)    All other components within normal limits  CBC WITH DIFFERENTIAL/PLATELET  LIPASE, BLOOD    EKG None  Radiology Ct Abdomen Pelvis W Contrast  Result Date:  05/30/2018 CLINICAL DATA:  73 y/o M; right lower quadrant abdominal pain radiating to the right flank. EXAM: CT ABDOMEN AND PELVIS WITH CONTRAST TECHNIQUE: Multidetector CT imaging of the abdomen and pelvis was performed using the standard protocol following bolus administration of intravenous contrast. CONTRAST:  62mL OMNIPAQUE IOHEXOL 300 MG/ML  SOLN COMPARISON:  06/21/2013 CT abdomen and pelvis. FINDINGS: Lower chest: Stable intrapulmonary lymph node along the right major fissure. Coronary artery calcific atherosclerosis. Partially visualize median sternotomy and postsurgical changes within the anterior abdominal wall. Stable moderate hiatal hernia. Hepatobiliary: No significant focal liver abnormality is seen. Status post cholecystectomy. Minimal intrahepatic biliary ductal dilatation within left lobe of liver is stable and likely compensatory post cholecystectomy. Subcentimeter cyst at dome of liver. Pancreas: Unremarkable. No pancreatic ductal dilatation or surrounding inflammatory changes. Spleen: Normal in size without focal abnormality. Adrenals/Urinary Tract: Normal adrenal glands. Left kidney interpolar cyst measuring 32 x 29 mm. Punctate nonobstructing kidney stone in lower pole of right kidney (series 5, image 57). No ureter stone or hydronephrosis. Normal bladder. Stomach/Bowel: Partial colectomy with rectosigmoid anastomosis. Sigmoid diverticulosis without findings of acute diverticulitis. Normal adrenal glands. No obstructive or inflammatory changes of the bowel. Vascular/Lymphatic: Aortic atherosclerosis. No enlarged abdominal or pelvic lymph nodes. Reproductive: Stable prostate enlargement. Other: No abdominal wall hernia or abnormality. No abdominopelvic ascites. Musculoskeletal: No fracture is seen. Moderate lower lumbar spondylosis. IMPRESSION: 1. No acute process identified. 2. Punctate nonobstructing stone in the lower pole of right kidney. No ureter stone or hydronephrosis. 3. Stable moderate  hiatal hernia. 4. Sigmoid diverticulosis without findings of acute diverticulitis. 5. Stable prostate enlargement. 6. Aortic Atherosclerosis (ICD10-I70.0). Electronically Signed   By: Kristine Garbe M.D.   On: 05/30/2018 17:54    Procedures Procedures (including critical care time)  Medications Ordered in ED Medications  sodium chloride 0.9 % bolus 1,000 mL (0 mLs Intravenous Stopped 05/30/18 1937)  ondansetron (ZOFRAN) injection 4 mg (4 mg Intravenous Given 05/30/18 1638)  HYDROmorphone (DILAUDID) injection 1 mg (1 mg Intravenous Given 05/30/18 1638)  iohexol (OMNIPAQUE) 300 MG/ML solution 80 mL (80  mLs Intravenous Contrast Given 05/30/18 1721)     Initial Impression / Assessment and Plan / ED Course  I have reviewed the triage vital signs and the nursing notes.  Pertinent labs & imaging results that were available during my care of the patient were reviewed by me and considered in my medical decision making (see chart for details).     Labs and CT scan unremarkable except for diverticulosis.  I instructed the patient to continue his antibiotics will give him some for pain and have him follow-up with his PCP  Final Clinical Impressions(s) / ED Diagnoses   Final diagnoses:  None    ED Discharge Orders    None       Milton Ferguson, MD 05/30/18 2006

## 2018-05-30 NOTE — Discharge Instructions (Addendum)
Follow-up with your doctor next week for recheck.  Continue taking your antibiotic

## 2018-05-30 NOTE — ED Triage Notes (Signed)
RLQ pain that radiates to RT flank

## 2018-06-03 DIAGNOSIS — Z6828 Body mass index (BMI) 28.0-28.9, adult: Secondary | ICD-10-CM | POA: Diagnosis not present

## 2018-06-03 DIAGNOSIS — E663 Overweight: Secondary | ICD-10-CM | POA: Diagnosis not present

## 2018-06-03 DIAGNOSIS — G4700 Insomnia, unspecified: Secondary | ICD-10-CM | POA: Diagnosis not present

## 2018-06-03 DIAGNOSIS — K5732 Diverticulitis of large intestine without perforation or abscess without bleeding: Secondary | ICD-10-CM | POA: Diagnosis not present

## 2018-06-03 DIAGNOSIS — N200 Calculus of kidney: Secondary | ICD-10-CM | POA: Diagnosis not present

## 2018-06-03 DIAGNOSIS — Z1389 Encounter for screening for other disorder: Secondary | ICD-10-CM | POA: Diagnosis not present

## 2018-06-30 ENCOUNTER — Ambulatory Visit: Payer: Medicare Other | Admitting: Cardiology

## 2018-08-07 ENCOUNTER — Other Ambulatory Visit: Payer: Self-pay | Admitting: Cardiology

## 2018-09-01 ENCOUNTER — Telehealth: Payer: Self-pay | Admitting: *Deleted

## 2018-09-01 NOTE — Telephone Encounter (Signed)
The patient verbally consented for a telehealth (video) visit with [facility] and understands that his/her insurance company will be billed for the encounter.  He will have vitals, weight & medications ready.

## 2018-09-02 ENCOUNTER — Other Ambulatory Visit: Payer: Self-pay | Admitting: *Deleted

## 2018-09-02 ENCOUNTER — Telehealth: Payer: Medicare Other | Admitting: Cardiology

## 2018-09-02 MED ORDER — ISOSORBIDE MONONITRATE ER 30 MG PO TB24: 15.0000 mg | ORAL_TABLET | Freq: Every day | ORAL | 1 refills | Status: DC

## 2018-09-05 ENCOUNTER — Telehealth (INDEPENDENT_AMBULATORY_CARE_PROVIDER_SITE_OTHER): Payer: Medicare Other | Admitting: Cardiology

## 2018-09-05 ENCOUNTER — Encounter: Payer: Self-pay | Admitting: Cardiology

## 2018-09-05 ENCOUNTER — Telehealth: Payer: Medicare Other | Admitting: Cardiology

## 2018-09-05 VITALS — BP 148/72 | HR 65 | Ht 70.0 in | Wt 172.0 lb

## 2018-09-05 DIAGNOSIS — E782 Mixed hyperlipidemia: Secondary | ICD-10-CM

## 2018-09-05 DIAGNOSIS — I2510 Atherosclerotic heart disease of native coronary artery without angina pectoris: Secondary | ICD-10-CM

## 2018-09-05 DIAGNOSIS — I10 Essential (primary) hypertension: Secondary | ICD-10-CM

## 2018-09-05 MED ORDER — AMLODIPINE BESYLATE 10 MG PO TABS: 10.0000 mg | ORAL_TABLET | Freq: Every day | ORAL | 1 refills | Status: DC

## 2018-09-05 NOTE — Patient Instructions (Signed)
Your physician wants you to follow-up in: 6 MONTHS WITH DR BRANCH You will receive a reminder letter in the mail two months in advance. If you don't receive a letter, please call our office to schedule the follow-up appointment.  Your physician has recommended you make the following change in your medication:   INCREASE AMLODIPINE 10 MG DAILY  Thank you for choosing [facility]!!     

## 2018-09-05 NOTE — Addendum Note (Signed)
Addended by: Julian Hy T on: 09/05/2018 09:28 AM   Modules accepted: Orders

## 2018-09-05 NOTE — Progress Notes (Signed)
Virtual Visit via Telephone Note   This visit type was conducted due to national recommendations for restrictions regarding the COVID-19 Pandemic (e.g. social distancing) in an effort to limit this patient's exposure and mitigate transmission in our community.  Due to his co-morbid illnesses, this patient is at least at moderate risk for complications without adequate follow up.  This format is felt to be most appropriate for this patient at this time.  The patient did not have access to video technology/had technical difficulties with video requiring transitioning to audio format only (telephone).  All issues noted in this document were discussed and addressed.  No physical exam could be performed with this format.  Please refer to the patient's chart for his  consent to telehealth for [facility].   Date:  09/05/2018   ID:  Said, Rueb 1945/10/18, MRN [id number]  Patient Location: Home Provider Location: Home  PCP:  Sharilyn Sites, MD  Cardiologist:  Carlyle Dolly, MD  Electrophysiologist:  None   Evaluation Performed:  Follow-Up Visit  Chief Complaint:  6 month follow up  History of Present Illness:    [name] is a 73 y.o. male seen today for follow up of the following medical problems.   1. CAD - CABG in 2000 - 06/2015 completed echo which showed normal LVEF. Exercise MPI he did 8 min 25 sec with no EKG changes and no ischemia by imaging.  - has not been on ACE-I since prior admission with AKI, had also been on NSAIDs at that time.   - admit 08/2016 with NSTEMI, cath as reported below, no interventional targets, managed medically. Imdur added to medical regimen and lopressor 12.5mg  bid added.    - no significant chest pain. Remains very active. No SOB or DOE - compliant with meds  2. HTN - metoprolol stopped due to low HR's in 09/2017  - home bp's 130-140ss/70s. HR 57-68   3. Hyperlipidemia - leg pains on vytorin. Did not tolerate low dose  pravastatin.  - he enrolled in clinical trial for cholesterol medication.Bempedoic acid trial.  - still participating in lipid study.     4. CKD - followed by nephrology by Dr Hinda Lenis.      The patient does not have symptoms concerning for COVID-19 infection (fever, chills, cough, or new shortness of breath).    Past Medical History:  Diagnosis Date  . Arteriosclerotic cardiovascular disease (ASCVD)    CABG in 08/1998  (LIMA-mLAD, SVG-rPDA, SVG-OM3-with Y SVG-lateral OM3.)  . BPH (benign prostatic hypertrophy)    h/x of prostatis/ UTI in 2006  . Chronic kidney disease, stage II (mild) 2010   Creatinine of 1.5 in 2010  . GERD (gastroesophageal reflux disease)    s/p espohageal dilatatin and repair of hiatal hernia  . Hyperlipidemia   . Hypertension   . IBS (irritable bowel syndrome)   . PONV (postoperative nausea and vomiting)   . Sigmoid diverticulosis    Past Surgical History:  Procedure Laterality Date  . BREAST BIOPSY Right 08/17/2013   Procedure: RIGHT BREAST BIOPSY;  Surgeon: [name], MD;  Location: [facility];  Service: General;  Laterality: Right;  . CHOLECYSTECTOMY  1990s  . COLONOSCOPY  03/17/2012   Rourk; colonic diverticulosis, repeat screening colonoscopy in 10 years.  . CORONARY ARTERY BYPASS GRAFT  2000  . ESOPHAGOGASTRODUODENOSCOPY  May 2009   Rourk: Food impaction, critical esophageal ring/stricture requiring dilation  . HEMORRHOID SURGERY  2007   total of three  .  HIATAL HERNIA REPAIR  1995  . LEFT HEART CATH AND CORS/GRAFTS ANGIOGRAPHY N/A 08/23/2016   Procedure: Left Heart Cath and Cors/Grafts Angiography;  Surgeon: Leonie Man, MD;  Location: [place] [facility];  Service: Cardiovascular;  Laterality: N/A;  . [place]     No outpatient medications have been marked as taking for the 09/05/18 encounter (Appointment) with Arnoldo Lenis, MD.     Allergies:   Statins; Codeine; Demerol [meperidine]; and Nalbuphine    Social History   Tobacco Use  . Smoking status: Never Smoker  . Smokeless tobacco: Never Used  . Tobacco comment: no use of tobacco products  Substance Use Topics  . Alcohol use: Yes    Alcohol/week: 2.0 standard drinks    Types: 2 Standard drinks or equivalent per week    Comment: "Occasionally"  . Drug use: No     Family Hx: The patient's family history includes Coronary artery disease in his father. There is no history of Colon cancer.  ROS:   Please see the history of present illness.     All other systems reviewed and are negative.   Prior CV studies:   The following studies were reviewed today:  06/2015 echo Study Conclusions  - Left ventricle: The cavity size was normal. Wall thickness was  increased increased in a pattern of mild to moderate LVH.  Systolic function was normal. The estimated ejection fraction was  in the range of 60% to 65%. Wall motion was normal; there were no  regional wall motion abnormalities. Left ventricular diastolic  function parameters were normal. - Aortic valve: Mildly calcified annulus. Trileaflet; mildly  thickened leaflets. Valve area (VTI): 2.58 cm^2. Valve area  (Vmax): 2.89 cm^2. - Mitral valve: There was mild regurgitation. - Systemic veins: Small IVC, suggesting low RA pressures and  hypovolemia. - Technically adequate study.   06/2015 Exercise Stress MPI  Blood pressure demonstrated a hypertensive response to exercise.  The study is normal.  This is a low risk study.  The left ventricular ejection fraction is normal (55-65%).   08/2016 cath  Mid RCA lesion, 80 %stenosed. Dist RCA lesion, 100 %stenosed.  SVG-mRPDA graft was visualized by angiography and is large and anatomically normal.  Potential Culpril (not PCI Target). Ost RPDA lesion, 90 %stenosed, Ost RPDA to RPDA lesion, 70 %stenosed.- filled retrograde from SVG  Ost 3rd Mrg to 3rd Mrg lesion, 80 %stenosed.  SVG-3rd OM graft was visualized  by angiography and is large and anatomically normal.  Y Graft SVG-Lat 3rd OM is moderate in size and anatomically normal. Origin lesion, 40 %stenosed.  Ost LAD to Prox LAD lesion, 80 %stenosed. Prox LAD to Mid LAD lesion, 99 %stenosed.  LIMA graft was visualized by angiography and is normal in caliber and anatomically normal.  The left ventricular systolic function is normal. The left ventricular ejection fraction is 55-65% by visual estimate.  LV end diastolic pressure is normal.  No obvious culprit lesions found. Clearly has small vessel disease involving the nongrafted diagonal Schneider [place] but this is not a very approachable PCI target. The other potential culprit is the severe disease in the retrograde flow from SVG-RPDA back to the PL system. Is also not PCI target.  Recommend: Optimize medical management     Labs/Other Tests and Data Reviewed:    EKG:  No ECG reviewed.  Recent Labs: 05/30/2018: ALT 22; BUN 21; Creatinine, Ser 1.51; Hemoglobin 14.1; Platelets 220; Potassium 4.2; Sodium 138   Recent Lipid Panel  Lab Results  Component Value Date/Time   CHOL 193 08/23/2016 05:08 AM   TRIG 100 08/23/2016 05:08 AM   HDL 40 (L) 08/23/2016 05:08 AM   CHOLHDL 4.8 08/23/2016 05:08 AM   LDLCALC 133 (H) 08/23/2016 05:08 AM    Wt Readings from Last 3 Encounters:  05/30/18 178 lb (80.7 kg)  12/31/17 182 lb (82.6 kg)  08/28/17 180 lb (81.6 kg)     Objective:    Vital Signs:   Vitals:   09/05/18 0856  BP: (!) 148/72  Pulse: 65    Normal affect. Normal speech pattern and tone. Comfortable, no apparent distress. No audible signs of SOB or wheezing. Alert and oriented  ASSESSMENT & PLAN:    1. CAD - doing well without symptoms, continue current meds - no beta blocker due to bradycardia, no ACEI or ARB due to renal dysfunction  2. HTN - remains above goal, increase norvacs to 10mg  daily.   3. Hyperlipidemia - has not tolerated statins - currently enrolled in  nonstatin cholesterol medication trial - continue current therapy    F/u 6 months.   COVID-19 Education: The signs and symptoms of COVID-19 were discussed with the patient and how to seek care for testing (follow up with PCP or arrange E-visit).  The importance of social distancing was discussed today.  Time:   Today, I have spent 19 minutes with the patient with telehealth technology discussing the above problems.     Medication Adjustments/Labs and Tests Ordered: Current medicines are reviewed at length with the patient today.  Concerns regarding medicines are outlined above.   Tests Ordered: No orders of the defined types were placed in this encounter.   Medication Changes: No orders of the defined types were placed in this encounter.   Disposition:  Follow up 6 months  Signed, Carlyle Dolly, MD  09/05/2018 8:15 AM    [place]

## 2018-09-09 DIAGNOSIS — R972 Elevated prostate specific antigen [PSA]: Secondary | ICD-10-CM | POA: Diagnosis not present

## 2018-09-16 ENCOUNTER — Ambulatory Visit (INDEPENDENT_AMBULATORY_CARE_PROVIDER_SITE_OTHER): Payer: Medicare Other | Admitting: Urology

## 2018-09-16 DIAGNOSIS — R972 Elevated prostate specific antigen [PSA]: Secondary | ICD-10-CM

## 2018-09-16 DIAGNOSIS — N5201 Erectile dysfunction due to arterial insufficiency: Secondary | ICD-10-CM

## 2018-09-16 DIAGNOSIS — N401 Enlarged prostate with lower urinary tract symptoms: Secondary | ICD-10-CM | POA: Diagnosis not present

## 2018-09-23 DIAGNOSIS — H43393 Other vitreous opacities, bilateral: Secondary | ICD-10-CM | POA: Diagnosis not present

## 2018-09-24 DIAGNOSIS — I10 Essential (primary) hypertension: Secondary | ICD-10-CM | POA: Diagnosis not present

## 2018-09-24 DIAGNOSIS — D649 Anemia, unspecified: Secondary | ICD-10-CM | POA: Diagnosis not present

## 2018-09-24 DIAGNOSIS — R809 Proteinuria, unspecified: Secondary | ICD-10-CM | POA: Diagnosis not present

## 2018-09-24 DIAGNOSIS — N183 Chronic kidney disease, stage 3 (moderate): Secondary | ICD-10-CM | POA: Diagnosis not present

## 2018-09-24 DIAGNOSIS — Z79899 Other long term (current) drug therapy: Secondary | ICD-10-CM | POA: Diagnosis not present

## 2018-09-24 DIAGNOSIS — E559 Vitamin D deficiency, unspecified: Secondary | ICD-10-CM | POA: Diagnosis not present

## 2018-09-29 DIAGNOSIS — M1711 Unilateral primary osteoarthritis, right knee: Secondary | ICD-10-CM | POA: Diagnosis not present

## 2018-11-18 ENCOUNTER — Other Ambulatory Visit: Payer: Self-pay | Admitting: Physician Assistant

## 2018-11-18 DIAGNOSIS — C44311 Basal cell carcinoma of skin of nose: Secondary | ICD-10-CM | POA: Diagnosis not present

## 2018-11-18 DIAGNOSIS — D485 Neoplasm of uncertain behavior of skin: Secondary | ICD-10-CM | POA: Diagnosis not present

## 2018-11-18 DIAGNOSIS — L570 Actinic keratosis: Secondary | ICD-10-CM | POA: Diagnosis not present

## 2018-11-18 DIAGNOSIS — D2239 Melanocytic nevi of other parts of face: Secondary | ICD-10-CM | POA: Diagnosis not present

## 2018-11-18 DIAGNOSIS — D0462 Carcinoma in situ of skin of left upper limb, including shoulder: Secondary | ICD-10-CM | POA: Diagnosis not present

## 2018-12-03 DIAGNOSIS — G4700 Insomnia, unspecified: Secondary | ICD-10-CM | POA: Diagnosis not present

## 2018-12-16 DIAGNOSIS — Z1159 Encounter for screening for other viral diseases: Secondary | ICD-10-CM | POA: Diagnosis not present

## 2018-12-29 ENCOUNTER — Other Ambulatory Visit: Payer: Self-pay

## 2018-12-29 ENCOUNTER — Emergency Department (HOSPITAL_COMMUNITY): Payer: Medicare Other

## 2018-12-29 ENCOUNTER — Encounter (HOSPITAL_COMMUNITY): Payer: Self-pay

## 2018-12-29 ENCOUNTER — Emergency Department (HOSPITAL_COMMUNITY)
Admission: EM | Admit: 2018-12-29 | Discharge: 2018-12-29 | Disposition: A | Discharge status: Home / Self Care | Payer: Medicare Other | Attending: Emergency Medicine | Admitting: Emergency Medicine

## 2018-12-29 ENCOUNTER — Telehealth: Payer: Self-pay | Admitting: Cardiology

## 2018-12-29 DIAGNOSIS — N183 Chronic kidney disease, stage 3 (moderate): Secondary | ICD-10-CM | POA: Diagnosis not present

## 2018-12-29 DIAGNOSIS — I252 Old myocardial infarction: Secondary | ICD-10-CM | POA: Insufficient documentation

## 2018-12-29 DIAGNOSIS — I129 Hypertensive chronic kidney disease with stage 1 through stage 4 chronic kidney disease, or unspecified chronic kidney disease: Secondary | ICD-10-CM | POA: Diagnosis not present

## 2018-12-29 DIAGNOSIS — Z79899 Other long term (current) drug therapy: Secondary | ICD-10-CM | POA: Diagnosis not present

## 2018-12-29 DIAGNOSIS — R0789 Other chest pain: Secondary | ICD-10-CM

## 2018-12-29 DIAGNOSIS — R7309 Other abnormal glucose: Secondary | ICD-10-CM | POA: Diagnosis not present

## 2018-12-29 DIAGNOSIS — Z951 Presence of aortocoronary bypass graft: Secondary | ICD-10-CM | POA: Insufficient documentation

## 2018-12-29 DIAGNOSIS — R350 Frequency of micturition: Secondary | ICD-10-CM | POA: Diagnosis not present

## 2018-12-29 DIAGNOSIS — Z6826 Body mass index (BMI) 26.0-26.9, adult: Secondary | ICD-10-CM | POA: Diagnosis not present

## 2018-12-29 DIAGNOSIS — I2510 Atherosclerotic heart disease of native coronary artery without angina pectoris: Secondary | ICD-10-CM | POA: Diagnosis not present

## 2018-12-29 LAB — BASIC METABOLIC PANEL
Anion gap: 11 (ref 5–15)
BUN: 19 mg/dL (ref 8–23)
CO2: 24 mmol/L (ref 22–32)
Calcium: 9.6 mg/dL (ref 8.9–10.3)
Chloride: 101 mmol/L (ref 98–111)
Creatinine, Ser: 1.38 mg/dL — ABNORMAL HIGH (ref 0.61–1.24)
GFR calc Af Amer: 59 mL/min — ABNORMAL LOW (ref >60)
GFR calc non Af Amer: 51 mL/min — ABNORMAL LOW (ref >60)
Glucose, Bld: 101 mg/dL — ABNORMAL HIGH (ref 70–99)
Potassium: 3.8 mmol/L (ref 3.5–5.1)
Sodium: 136 mmol/L (ref 135–145)

## 2018-12-29 LAB — CBC
HCT: 45.7 % (ref 39.0–52.0)
Hemoglobin: 14.7 g/dL (ref 13.0–17.0)
MCH: 32.1 pg (ref 26.0–34.0)
MCHC: 32.2 g/dL (ref 30.0–36.0)
MCV: 99.8 fL (ref 80.0–100.0)
Platelets: 249 10*3/uL (ref 150–400)
RBC: 4.58 MIL/uL (ref 4.22–5.81)
RDW: 13.1 % (ref 11.5–15.5)
WBC: 8.1 10*3/uL (ref 4.0–10.5)
nRBC: 0 % (ref 0.0–0.2)

## 2018-12-29 LAB — TROPONIN I (HIGH SENSITIVITY)
Troponin I (High Sensitivity): 6 ng/L (ref <18)
Troponin I (High Sensitivity): 7 ng/L (ref <18)

## 2018-12-29 NOTE — Telephone Encounter (Signed)
Note reviewed, we will see how his assessment with his pcp goes. If they feel like its an onging significant issue we can work to have him seen. Staci can we get the clinic note from the pcp and also check in with the patient later this week about ongoing symptoms.   Zandra Abts MD

## 2018-12-29 NOTE — ED Notes (Signed)
EKG and blood work completed while patient was out front in triage. Patient denies any chest pain at this time.

## 2018-12-29 NOTE — Telephone Encounter (Signed)
S/w pt he states that yesterday his feet/ankles were very swollen and his BP went up to 180/79 he states that his BP has not been high for "quiet a while" for at least the last several months he states that his BP has been running 140/75's. He states that he took an extra 1/2 (5mg ) amlodipine and in the afternoon it came back down to 150/75. He states that he had " a little irritation in his throat that makes him cough " intermittantly. He states that this is from the insecticide that he was using. He denies any other sx, chest pain or pressure, dizziness, etc. He states that he has no swelling today and his BP today is 146/77 HR 72. He states that he has appt today at 2pm with his PCP for EKG and further advise. Please look for this note/EKG. Pt notified that Dr branch is out of the [place] office.

## 2018-12-29 NOTE — Telephone Encounter (Signed)
Patient's feet and legs swelling along with high Blood pressure 180/79.  Really concerned asking to be seen by Branch today, but explained not in our office today

## 2018-12-29 NOTE — ED Triage Notes (Signed)
Pt presents to ED with right sided chest pressure x 1 week. Pt denies SOB, N/V, dizziness.

## 2018-12-29 NOTE — ED Provider Notes (Signed)
[facility] EMERGENCY DEPARTMENT Provider Note   CSN: [id number] Arrival date & time: 12/29/18  1458     History   Chief Complaint Chief Complaint  Patient presents with  . Chest Pain    HPI [name] is a 73 y.o. male.     Patient followed by Dr. Philis Nettle from cardiology in [place].  Patient with a complaint of right-sided chest pain somewhat intermittent for a week.  Said somewhat worse with using his right arm said he has been lifting some heavy objects.  Not associated with shortness of breath nausea vomiting or dizziness.  Past medical history is significant for coronary artery disease.  Had a CABG in May 2020 thousand.  Also hypertension hyperlipidemia.  Chronic kidney disease.  Patient states the pain has not gotten worse today.  Did have a discussion with his cardiologist.  But they could not see him in the office.     Past Medical History:  Diagnosis Date  . Arteriosclerotic cardiovascular disease (ASCVD)    CABG in 08/1998  (LIMA-mLAD, SVG-rPDA, SVG-OM3-with Y SVG-lateral OM3.)  . BPH (benign prostatic hypertrophy)    h/x of prostatis/ UTI in 2006  . Chronic kidney disease, stage II (mild) 2010   Creatinine of 1.5 in 2010  . GERD (gastroesophageal reflux disease)    s/p espohageal dilatatin and repair of hiatal hernia  . Hyperlipidemia   . Hypertension   . IBS (irritable bowel syndrome)   . PONV (postoperative nausea and vomiting)   . Sigmoid diverticulosis     Patient Active Problem List   Diagnosis Date Noted  . NSTEMI (non-ST elevated myocardial infarction) ([place]) 08/22/2016  . IBS (irritable bowel syndrome) 08/03/2013  . Chest pain on exertion 07/16/2012  . Acute on chronic renal failure ([place]) 07/16/2012  . Hyponatremia 07/16/2012  . Hx of CABG   . GERD (gastroesophageal reflux disease)   . Hypertension   . Dyslipidemia   . BPH (benign prostatic hypertrophy)   . CRI (chronic renal insufficiency), stage 3 (moderate) ([place]) 04/05/2009    Past  Surgical History:  Procedure Laterality Date  . BREAST BIOPSY Right 08/17/2013   Procedure: RIGHT BREAST BIOPSY;  Surgeon: [name], MD;  Location: [facility];  Service: General;  Laterality: Right;  . CHOLECYSTECTOMY  1990s  . COLONOSCOPY  03/17/2012   Rourk; colonic diverticulosis, repeat screening colonoscopy in 10 years.  . CORONARY ARTERY BYPASS GRAFT  2000  . ESOPHAGOGASTRODUODENOSCOPY  May 2009   Rourk: Food impaction, critical esophageal ring/stricture requiring dilation  . HEMORRHOID SURGERY  2007   total of three  . HIATAL HERNIA REPAIR  1995  . LEFT HEART CATH AND CORS/GRAFTS ANGIOGRAPHY N/A 08/23/2016   Procedure: Left Heart Cath and Cors/Grafts Angiography;  Surgeon: Leonie Man, MD;  Location: [facility];  Service: Cardiovascular;  Laterality: N/A;  . [place] Medications    Prior to Admission medications   Medication Sig Start Date End Date Taking? Authorizing Provider  acetaminophen (TYLENOL) 325 MG tablet Take 650 mg by mouth every 6 (six) hours as needed for moderate pain.    [provider]  amLODipine (NORVASC) 10 MG tablet Take 1 tablet (10 mg total) by mouth daily. 09/05/18 12/04/18  Arnoldo Lenis, MD  aspirin 81 MG EC tablet Take 81 mg by mouth at bedtime.     [provider]  finasteride (PROSCAR) 5 MG tablet Take 5 mg by mouth  every evening.    [provider]  Investigational - Study Medication Take 1 tablet by mouth every morning. Study name:Bempedoic Acid 180mg  Additional study details:Cholesterol medication    [provider]  isosorbide mononitrate (IMDUR) 30 MG 24 hr tablet Take 0.5 tablets (15 mg total) by mouth daily. 09/02/18   Arnoldo Lenis, MD  metroNIDAZOLE (FLAGYL) 500 MG tablet Take 500 mg by mouth 3 (three) times daily.    [provider]  omeprazole (PRILOSEC) 20 MG capsule Take 20 mg by mouth 2 (two) times daily. Before LUNCH and Before SUPPER     [provider]  Probiotic Product (PROBIOTIC DAILY PO) Take 1 capsule by mouth daily.     [provider]  psyllium (METAMUCIL) 58.6 % powder Take 1 packet by mouth daily.    [provider]  tamsulosin (FLOMAX) 0.4 MG CAPS capsule Take 0.4 mg by mouth at bedtime.    [provider]  Tetrahydrozoline HCl (VISINE OP) Apply 1 drop to eye 2 (two) times daily as needed (dry eyes).    [provider]  traMADol (ULTRAM) 50 MG tablet Take 1 tablet (50 mg total) by mouth every 6 (six) hours as needed. 05/30/18   Milton Ferguson, MD  zolpidem (AMBIEN) 10 MG tablet Take 5 tablets by mouth at bedtime. 04/26/18   [provider]    Family History Family History  Problem Relation Age of Onset  . Coronary artery disease Father        also grandfather  . Colon cancer Neg Hx     Social History Social History   Tobacco Use  . Smoking status: Never Smoker  . Smokeless tobacco: Never Used  . Tobacco comment: no use of tobacco products  Substance Use Topics  . Alcohol use: Yes    Alcohol/week: 2.0 standard drinks    Types: 2 Standard drinks or equivalent per week    Comment: "Occasionally"  . Drug use: No     Allergies   Statins, Codeine, Demerol [meperidine], and Nalbuphine   Review of Systems Review of Systems  Constitutional: Negative for chills and fever.  HENT: Negative for congestion, rhinorrhea and sore throat.   Eyes: Negative for visual disturbance.  Respiratory: Negative for cough and shortness of breath.   Cardiovascular: Positive for chest pain. Negative for leg swelling.  Gastrointestinal: Negative for abdominal pain, diarrhea, nausea and vomiting.  Genitourinary: Negative for dysuria.  Musculoskeletal: Negative for back pain and neck pain.  Skin: Negative for rash.  Neurological: Negative for dizziness, light-headedness and headaches.  Hematological: Does not bruise/bleed easily.  Psychiatric/Behavioral: Negative for  confusion.     Physical Exam Updated Vital Signs BP (!) 146/83   Pulse (!) 58   Temp 97.9 F (36.6 C) (Oral)   Resp 11   Ht 1.778 m (5\' 10" )   Wt 78 kg   SpO2 98%   BMI 24.68 kg/m   Physical Exam Vitals signs and nursing note reviewed.  Constitutional:      Appearance: Normal appearance. He is well-developed.  HENT:     Head: Normocephalic and atraumatic.  Eyes:     Extraocular Movements: Extraocular movements intact.     Conjunctiva/sclera: Conjunctivae normal.     Pupils: Pupils are equal, round, and reactive to light.  Neck:     Musculoskeletal: Neck supple.  Cardiovascular:     Rate and Rhythm: Normal rate and regular rhythm.     Heart sounds: No murmur.  Pulmonary:  Effort: Pulmonary effort is normal. No respiratory distress.     Breath sounds: Normal breath sounds.  Chest:     Chest wall: No tenderness.  Abdominal:     Palpations: Abdomen is soft.     Tenderness: There is no abdominal tenderness.  Musculoskeletal:     Comments: Trace lower extremity edema.  Equal bilaterally.  Skin:    General: Skin is warm and dry.  Neurological:     General: No focal deficit present.     Mental Status: He is alert and oriented to person, place, and time.      ED Treatments / Results  Labs (all labs ordered are listed, but only abnormal results are displayed) Labs Reviewed  BASIC METABOLIC PANEL - Abnormal; Notable for the following components:      Result Value   Glucose, Bld 101 (*)    Creatinine, Ser 1.38 (*)    GFR calc non Af Amer 51 (*)    GFR calc Af Amer 59 (*)    All other components within normal limits  CBC  TROPONIN I (HIGH SENSITIVITY)  TROPONIN I (HIGH SENSITIVITY)    EKG None   ED ECG REPORT   Date: 12/29/2018  Rate: 65  Rhythm: normal sinus rhythm  QRS Axis: normal  Intervals: normal  ST/T Wave abnormalities: nonspecific T wave changes  Conduction Disutrbances:right bundle branch block  Narrative Interpretation:   Old EKG  Reviewed: none available  I have personally reviewed the EKG tracing and agree with the computerized printout as noted.   Radiology Dg Chest 2 View  Result Date: 12/29/2018 CLINICAL DATA:  Right-sided chest pressure for 1 week. History of CABG and hypertension. EXAM: CHEST - 2 VIEW COMPARISON:  Radiographs 08/22/2016. FINDINGS: The heart size and mediastinal contours are stable status post CABG. There is a stable small hiatal hernia. The lungs are clear. There is no pleural effusion or pneumothorax. No acute osseous findings. IMPRESSION: Stable chest.  No active cardiopulmonary process. Electronically Signed   By: Richardean Sale M.D.   On: 12/29/2018 15:36    Procedures Procedures (including critical care time)  Medications Ordered in ED Medications - No data to display   Initial Impression / Assessment and Plan / ED Course  I have reviewed the triage vital signs and the nursing notes.  Pertinent labs & imaging results that were available during my care of the patient were reviewed by me and considered in my medical decision making (see chart for details).       Patient's work-up for the right-sided chest pain that may be musculoskeletal in nature.  Without any acute findings.  Troponins x2 without any significant delta change or significant elevation.  Chest x-ray no acute process.  EKG had right bundle branch block pattern.  Patient stable for discharge home and follow-up with cardiology.  Clinically not concerned about pulmonary embolus.  No shortness of breath.  No hypoxia.   Final Clinical Impressions(s) / ED Diagnoses   Final diagnoses:  Chest wall pain    ED Discharge Orders    None       Fredia Sorrow, MD 12/29/18 2251

## 2018-12-29 NOTE — Discharge Instructions (Addendum)
Work-up today for the chest pain without any acute findings.  Sounds like it could be right-sided chest wall pain.  Make an appointment to follow-up with cardiology.  Return for any new chest pain severe lasting 15 or 20 minutes or longer.

## 2018-12-30 ENCOUNTER — Encounter: Payer: Self-pay | Admitting: *Deleted

## 2018-12-30 NOTE — Telephone Encounter (Signed)
Normal workup in the ER, he has f/u with me in 2 days, we will go through everything then   J BrancH MD

## 2018-12-30 NOTE — Telephone Encounter (Signed)
Pt aware and will keep f/u as scheduled

## 2018-12-30 NOTE — Telephone Encounter (Signed)
Records requested - looks like pt went to [facility] ED yesterday as well

## 2019-01-01 ENCOUNTER — Ambulatory Visit (INDEPENDENT_AMBULATORY_CARE_PROVIDER_SITE_OTHER): Payer: Medicare Other | Admitting: Cardiology

## 2019-01-01 ENCOUNTER — Other Ambulatory Visit: Payer: Self-pay

## 2019-01-01 ENCOUNTER — Encounter: Payer: Self-pay | Admitting: Cardiology

## 2019-01-01 VITALS — BP 165/72 | HR 78 | Ht 70.0 in | Wt 173.2 lb

## 2019-01-01 DIAGNOSIS — I2510 Atherosclerotic heart disease of native coronary artery without angina pectoris: Secondary | ICD-10-CM

## 2019-01-01 DIAGNOSIS — I10 Essential (primary) hypertension: Secondary | ICD-10-CM | POA: Diagnosis not present

## 2019-01-01 DIAGNOSIS — R0789 Other chest pain: Secondary | ICD-10-CM

## 2019-01-01 DIAGNOSIS — R600 Localized edema: Secondary | ICD-10-CM

## 2019-01-01 MED ORDER — SPIRONOLACTONE 25 MG PO TABS: 12.5000 mg | ORAL_TABLET | Freq: Every day | ORAL | 1 refills | Status: DC

## 2019-01-01 NOTE — Patient Instructions (Signed)
Your physician wants you to follow-up in: [place] will receive a reminder letter in the mail two months in advance. If you don't receive a letter, please call our office to schedule the follow-up appointment.  Your physician has recommended you make the following change in your medication:   START SPIRONOLACTONE 12.5 MG (1/2 TABLET) DAILY  Your physician recommends that you return for lab work in: [place] has requested that you regularly monitor and record your blood pressure readings at home FOR 2 WEEKS AND CALL us WITH READINGS. Please use the same machine at the same time of day to check your readings   Thank you for choosing [facility]!!

## 2019-01-01 NOTE — Progress Notes (Signed)
Clinical Summary [name] is a 73 y.o.male seen today for follow up of the following medical problems. This is a focused visit on recent chest pain, high bp, and leg swelling.   1. CAD - CABG in 2000 - 06/2015 completed echo which showed normal LVEF. Exercise MPI he did 8 min 25 sec with no EKG changes and no ischemia by imaging.  - has not been on ACE-I since prior admission with AKI, had also been on NSAIDs at that time.   - admit 08/2016 with NSTEMI, cath as reported below, no interventional targets, managed medically. Imdur added to medical regimen and lopressor 12.5mg  bid added.   - ER visit 9/14 with chest pain - hstrop neg x 2. CXR no acute process - pain started about 2-3 weeks ago. [place]. Felt pain in his right arm, right sided pain. Tend with pressing on area, radiates to back. Has been improving over time, though worst with heavy lifting. Lasts a few minutes at a time. No other associated symptoms. Continues to walk regularly without troubles.    2. HTN - metoprolol stopped due to low HR's in 09/2017 - prior AKI on ACE-I, though heavy NSAID use at the time also    Home bps 140s/70s. Last week bp's elevated, had some LE edema - last visit we increased norvasc to 10mg  daily. Reports some prior swelling in the past on norvasc but has tolerated reasonablly well since then - reports high sodium intake around the time of swelling. After about a day the swelling resolved on its own.         Past Medical History:  Diagnosis Date  . Arteriosclerotic cardiovascular disease (ASCVD)    CABG in 08/1998  (LIMA-mLAD, SVG-rPDA, SVG-OM3-with Y SVG-lateral OM3.)  . BPH (benign prostatic hypertrophy)    h/x of prostatis/ UTI in 2006  . Chronic kidney disease, stage II (mild) 2010   Creatinine of 1.5 in 2010  . GERD (gastroesophageal reflux disease)    s/p espohageal dilatatin and repair of hiatal hernia  . Hyperlipidemia   .  Hypertension   . IBS (irritable bowel syndrome)   . PONV (postoperative nausea and vomiting)   . Sigmoid diverticulosis      Allergies  Allergen Reactions  . Statins Other (See Comments)    Severe muscle pain  . Codeine Nausea And Vomiting  . Demerol [Meperidine] Nausea And Vomiting  . Nalbuphine Nausea And Vomiting     Current Outpatient Medications  Medication Sig Dispense Refill  . acetaminophen (TYLENOL) 325 MG tablet Take 650 mg by mouth every 6 (six) hours as needed for moderate pain.    Marland Kitchen amLODipine (NORVASC) 10 MG tablet Take 1 tablet (10 mg total) by mouth daily. 90 tablet 1  . aspirin 81 MG EC tablet Take 81 mg by mouth at bedtime.     . finasteride (PROSCAR) 5 MG tablet Take 5 mg by mouth every evening.    . Investigational - Study Medication Take 1 tablet by mouth every morning. Study name:Bempedoic Acid 180mg  Additional study details:Cholesterol medication    . isosorbide mononitrate (IMDUR) 30 MG 24 hr tablet Take 0.5 tablets (15 mg total) by mouth daily. 45 tablet 1  . Ketotifen Fumarate (ALLERGY EYE DROPS OP) Apply to eye as needed.    Marland Kitchen omeprazole (PRILOSEC) 20 MG capsule Take 20 mg by mouth 2 (two) times daily. Before LUNCH and Before SUPPER    . Probiotic  Product (PROBIOTIC DAILY PO) Take 1 capsule by mouth daily.     . psyllium (METAMUCIL) 58.6 % powder Take 1 packet by mouth daily.    . tamsulosin (FLOMAX) 0.4 MG CAPS capsule Take 0.4 mg by mouth at bedtime.    Marland Kitchen zolpidem (AMBIEN) 10 MG tablet Take 5 tablets by mouth at bedtime.     No current facility-administered medications for this visit.      Past Surgical History:  Procedure Laterality Date  . BREAST BIOPSY Right 08/17/2013   Procedure: RIGHT BREAST BIOPSY;  Surgeon: [name], MD;  Location: [facility];  Service: General;  Laterality: Right;  . CHOLECYSTECTOMY  1990s  . COLONOSCOPY  03/17/2012   Rourk; colonic diverticulosis, repeat screening colonoscopy in 10 years.  . CORONARY ARTERY BYPASS  GRAFT  2000  . ESOPHAGOGASTRODUODENOSCOPY  May 2009   Rourk: Food impaction, critical esophageal ring/stricture requiring dilation  . HEMORRHOID SURGERY  2007   total of three  . HIATAL HERNIA REPAIR  1995  . LEFT HEART CATH AND CORS/GRAFTS ANGIOGRAPHY N/A 08/23/2016   Procedure: Left Heart Cath and Cors/Grafts Angiography;  Surgeon: Leonie Man, MD;  Location: [facility];  Service: Cardiovascular;  Laterality: N/A;  . LUMBAR LAMINECTOMY  1999     Allergies  Allergen Reactions  . Statins Other (See Comments)    Severe muscle pain  . Codeine Nausea And Vomiting  . Demerol [Meperidine] Nausea And Vomiting  . Nalbuphine Nausea And Vomiting      Family History  Problem Relation Age of Onset  . Coronary artery disease Father        also grandfather  . Colon cancer Neg Hx      Social History [name] reports that he has never smoked. He has never used smokeless tobacco. [name] reports current alcohol use of about 2.0 standard drinks of alcohol per week.   Review of Systems CONSTITUTIONAL: No weight loss, fever, chills, weakness or fatigue.  HEENT: Eyes: No visual loss, blurred vision, double vision or yellow sclerae.No hearing loss, sneezing, congestion, runny nose or sore throat.  SKIN: No rash or itching.  CARDIOVASCULAR: per hpi RESPIRATORY: No shortness of breath, cough or sputum.  GASTROINTESTINAL: No anorexia, nausea, vomiting or diarrhea. No abdominal pain or blood.  GENITOURINARY: No burning on urination, no polyuria NEUROLOGICAL: No headache, dizziness, syncope, paralysis, ataxia, numbness or tingling in the extremities. No change in bowel or bladder control.  MUSCULOSKELETAL: No muscle, back pain, joint pain or stiffness.  LYMPHATICS: No enlarged nodes. No history of splenectomy.  PSYCHIATRIC: No history of depression or anxiety.  ENDOCRINOLOGIC: No reports of sweating, cold or heat intolerance. No polyuria or polydipsia.  Marland Kitchen   Physical  Examination Vitals:   01/01/19 1340  BP: (!) 165/72  Pulse: 78  SpO2: 99%   Filed Weights   01/01/19 1340  Weight: 173 lb 3.2 oz (78.6 kg)    Gen: resting comfortably, no acute distress HEENT: no scleral icterus, pupils equal round and reactive, no palptable cervical adenopathy,  CV: RRR, no m/r/g, no jvd Resp: Clear to auscultation bilaterally GI: abdomen is soft, non-tender, non-distended, normal bowel sounds, no hepatosplenomegaly MSK: extremities are warm, no edema.  Skin: warm, no rash Neuro:  no focal deficits Psych: appropriate affect   Diagnostic Studies 06/2015 echo Study Conclusions  - Left ventricle: The cavity size was normal. Wall thickness was  increased increased in a pattern of mild to moderate LVH.  Systolic function was normal.  The estimated ejection fraction was  in the range of 60% to 65%. Wall motion was normal; there were no  regional wall motion abnormalities. Left ventricular diastolic  function parameters were normal. - Aortic valve: Mildly calcified annulus. Trileaflet; mildly  thickened leaflets. Valve area (VTI): 2.58 cm^2. Valve area  (Vmax): 2.89 cm^2. - Mitral valve: There was mild regurgitation. - Systemic veins: Small IVC, suggesting low RA pressures and  hypovolemia. - Technically adequate study.   06/2015 Exercise Stress MPI  Blood pressure demonstrated a hypertensive response to exercise.  The study is normal.  This is a low risk study.  The left ventricular ejection fraction is normal (55-65%).   08/2016 cath  Mid RCA lesion, 80 %stenosed. Dist RCA lesion, 100 %stenosed.  SVG-mRPDA graft was visualized by angiography and is large and anatomically normal.  Potential Culpril (not PCI Target). Ost RPDA lesion, 90 %stenosed, Ost RPDA to RPDA lesion, 70 %stenosed.- filled retrograde from SVG  Ost 3rd Mrg to 3rd Mrg lesion, 80 %stenosed.  SVG-3rd OM graft was visualized by angiography and is large and anatomically  normal.  Y Graft SVG-Lat 3rd OM is moderate in size and anatomically normal. Origin lesion, 40 %stenosed.  Ost LAD to Prox LAD lesion, 80 %stenosed. Prox LAD to Mid LAD lesion, 99 %stenosed.  LIMA graft was visualized by angiography and is normal in caliber and anatomically normal.  The left ventricular systolic function is normal. The left ventricular ejection fraction is 55-65% by visual estimate.  LV end diastolic pressure is normal.  No obvious culprit lesions found. Clearly has small vessel disease involving the nongrafted diagonal [name] but this is not a very approachable PCI target. The other potential culprit is the severe disease in the retrograde flow from SVG-RPDA back to the PL system. Is also not PCI target.  Recommend: Optimize medical management     Assessment and Plan  1. 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   2. HTN -bp above goal. Bradycardia on beta blockers, AKI on ACEI, also avoiding diuretic due to renal dysfunction - would try aldactone 12.5mg  daily, check BMET in 2 weeks and he will update [place] on home bps in 2 weeks.   3. LE edema - resolved, high sodium intake at the time may have been the cause. We had increased his norvasc but that was several months prior to the swelling - monitor at this time, if needed would use prn diuretic, avoid daily diuretic due to his renal dysfunction   F/u 6 months      Arnoldo Lenis, M.D

## 2019-01-15 ENCOUNTER — Other Ambulatory Visit: Payer: Self-pay | Admitting: Cardiology

## 2019-01-15 ENCOUNTER — Telehealth: Payer: Self-pay | Admitting: Cardiology

## 2019-01-15 DIAGNOSIS — I10 Essential (primary) hypertension: Secondary | ICD-10-CM | POA: Diagnosis not present

## 2019-01-15 LAB — BASIC METABOLIC PANEL WITH GFR
BUN/Creatinine Ratio: 15 (calc) (ref 6–22)
BUN: 23 mg/dL (ref 7–25)
CO2: 28 mmol/L (ref 20–32)
Calcium: 10.3 mg/dL (ref 8.6–10.3)
Chloride: 102 mmol/L (ref 98–110)
Creat: 1.52 mg/dL — ABNORMAL HIGH (ref 0.70–1.18)
GFR, Est African American: 52 mL/min/{1.73_m2} — ABNORMAL LOW (ref >=60)
GFR, Est Non African American: 45 mL/min/{1.73_m2} — ABNORMAL LOW (ref >=60)
Glucose, Bld: 92 mg/dL (ref 65–139)
Potassium: 4.9 mmol/L (ref 3.5–5.3)
Sodium: 138 mmol/L (ref 135–146)

## 2019-01-15 NOTE — Telephone Encounter (Signed)
Patient calling back to give BP readings

## 2019-01-15 NOTE — Telephone Encounter (Signed)
Pt called with BP readings - starting taking Aldactone 12.5 mg daily on 9/18 - says he has occasional swelling in his feet in the afternoon but says not bothersome and has already discussed with Dr Harl Bowie - had labs done today across from [facility]   9/20 BP 136/63 HR 62 9/22 BP 135/62 HR 62 9/24 BP 129/76 HR 68 9/27 BP 136/68 HR 64 9/29 BP 129/76 HR 62 10/1 BP 128/62 HR 63

## 2019-01-15 NOTE — Telephone Encounter (Signed)
Patient calling with his BP readings for past two weeks

## 2019-01-16 NOTE — Telephone Encounter (Signed)
Bp's are reasonable, his labs look fine as well. Continue current therapy   J BranchMD

## 2019-01-19 NOTE — Telephone Encounter (Signed)
Pt made aware

## 2019-01-19 NOTE — Telephone Encounter (Signed)
Patient returned call

## 2019-01-21 ENCOUNTER — Telehealth: Payer: Self-pay | Admitting: *Deleted

## 2019-01-21 NOTE — Telephone Encounter (Signed)
-----   Message from Arnoldo Lenis, MD sent at 01/20/2019  2:04 PM EDT ----- Labs look fine. Renal function is decreased but overall stable from prior labs   Zandra Abts MD

## 2019-01-21 NOTE — Telephone Encounter (Signed)
Pt voiced understanding - routed to pcp  

## 2019-01-26 ENCOUNTER — Telehealth: Payer: Self-pay | Admitting: Cardiology

## 2019-01-26 NOTE — Telephone Encounter (Signed)
Reports having an aching in both legs like muscle pain, similar to what he had when taking statin drugs that started last week. Patient said he thought it was the new medication given on 01/01/2019 (spironolactone). Also reports itching in both legs that started last week and says he used benadryl cream that improved the itching. Reports stopping spironolactone yesterday morning and the pain in legs improved. Denies redness or swelling in legs. Advised to stay off spironolactone for now to see if he continues to have improvement. Advised that his provider would be back in the office later on this week and he would be contacted with a response after message is reviewed by his provider. Verbalized understanding of plan.

## 2019-01-26 NOTE — Telephone Encounter (Signed)
Patient called stating that he needs to speak with someone in regards to his medications. States that his legs and feet are bothering him.

## 2019-02-03 NOTE — Telephone Encounter (Signed)
Pt says he has stopped aldactone for a little over a week and legs stopped hurting - says he re started aldactone for 1 day and legs almost instantly started hurting again - he stopped this again and denies any leg pain since - wanted to know if it may be an interaction with Aldactone and Bembeonic acid the trial medication he is on for cholesterol

## 2019-02-03 NOTE — Telephone Encounter (Signed)
*  Bempeodic Acid

## 2019-02-03 NOTE — Telephone Encounter (Signed)
Can we check back with this patinet how he is doing since stopping aldactone   J Jobanny Mavis MD

## 2019-02-04 NOTE — Telephone Encounter (Signed)
I think its just coming from the aldactone alone. Can we try eplerenone as an alternative, it has a lot of the same benefits as aldactone but less side effects. Would start 12.5mg  daily, if any issues with cost through his insurance company have him reach out back to [place]   Carlyle Dolly MD

## 2019-02-05 MED ORDER — EPLERENONE 25 MG PO TABS: 12.5000 mg | ORAL_TABLET | Freq: Every day | ORAL | 3 refills | Status: DC

## 2019-02-05 NOTE — Telephone Encounter (Signed)
Pt voiced understanding - update medication list and eplerenone sent to [facility]

## 2019-02-05 NOTE — Addendum Note (Signed)
Addended by: Julian Hy T on: 02/05/2019 08:49 AM   Modules accepted: Orders

## 2019-02-06 ENCOUNTER — Telehealth: Payer: Self-pay | Admitting: Cardiology

## 2019-02-06 NOTE — Telephone Encounter (Signed)
Patient called stating that his insurance needs authorization for his new medication that Dr. Harl Bowie called in. [facility] [place], [place]

## 2019-02-06 NOTE — Telephone Encounter (Signed)
Pt insurance will not cover this medication when checking with cover my meds - will forward to provider for alternatives

## 2019-02-10 MED ORDER — HYDRALAZINE HCL 25 MG PO TABS: 25.0000 mg | ORAL_TABLET | Freq: Two times a day (BID) | ORAL | 3 refills | Status: DC

## 2019-02-10 NOTE — Telephone Encounter (Signed)
Pt voiced understanding - updated medication list 

## 2019-02-10 NOTE — Telephone Encounter (Signed)
Stop eplerenone. Can start hydralazine 25mg  bid   Zandra Abts MD

## 2019-02-10 NOTE — Addendum Note (Signed)
Addended by: Julian Hy T on: 02/10/2019 04:55 PM   Modules accepted: Orders

## 2019-02-17 ENCOUNTER — Other Ambulatory Visit: Payer: Self-pay | Admitting: Cardiology

## 2019-02-23 ENCOUNTER — Other Ambulatory Visit: Payer: Self-pay | Admitting: Cardiology

## 2019-02-24 DIAGNOSIS — T50905S Adverse effect of unspecified drugs, medicaments and biological substances, sequela: Secondary | ICD-10-CM | POA: Diagnosis not present

## 2019-02-24 DIAGNOSIS — M79604 Pain in right leg: Secondary | ICD-10-CM | POA: Diagnosis not present

## 2019-02-24 DIAGNOSIS — Z6826 Body mass index (BMI) 26.0-26.9, adult: Secondary | ICD-10-CM | POA: Diagnosis not present

## 2019-02-24 DIAGNOSIS — E663 Overweight: Secondary | ICD-10-CM | POA: Diagnosis not present

## 2019-03-11 ENCOUNTER — Other Ambulatory Visit: Payer: Self-pay

## 2019-03-20 DIAGNOSIS — Z6825 Body mass index (BMI) 25.0-25.9, adult: Secondary | ICD-10-CM | POA: Diagnosis not present

## 2019-03-20 DIAGNOSIS — Z1389 Encounter for screening for other disorder: Secondary | ICD-10-CM | POA: Diagnosis not present

## 2019-03-20 DIAGNOSIS — Z0001 Encounter for general adult medical examination with abnormal findings: Secondary | ICD-10-CM | POA: Diagnosis not present

## 2019-03-20 DIAGNOSIS — E663 Overweight: Secondary | ICD-10-CM | POA: Diagnosis not present

## 2019-04-21 ENCOUNTER — Ambulatory Visit: Payer: Medicare Other | Admitting: Urology

## 2019-04-27 ENCOUNTER — Other Ambulatory Visit: Payer: Self-pay

## 2019-04-27 DIAGNOSIS — R972 Elevated prostate specific antigen [PSA]: Secondary | ICD-10-CM

## 2019-05-06 ENCOUNTER — Telehealth: Payer: Self-pay | Admitting: *Deleted

## 2019-05-06 NOTE — Telephone Encounter (Signed)
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

## 2019-05-12 DIAGNOSIS — S338XXA Sprain of other parts of lumbar spine and pelvis, initial encounter: Secondary | ICD-10-CM | POA: Diagnosis not present

## 2019-05-12 DIAGNOSIS — M9903 Segmental and somatic dysfunction of lumbar region: Secondary | ICD-10-CM | POA: Diagnosis not present

## 2019-05-13 DIAGNOSIS — S338XXA Sprain of other parts of lumbar spine and pelvis, initial encounter: Secondary | ICD-10-CM | POA: Diagnosis not present

## 2019-05-13 DIAGNOSIS — M9903 Segmental and somatic dysfunction of lumbar region: Secondary | ICD-10-CM | POA: Diagnosis not present

## 2019-05-15 DIAGNOSIS — S338XXA Sprain of other parts of lumbar spine and pelvis, initial encounter: Secondary | ICD-10-CM | POA: Diagnosis not present

## 2019-05-15 DIAGNOSIS — M9903 Segmental and somatic dysfunction of lumbar region: Secondary | ICD-10-CM | POA: Diagnosis not present

## 2019-05-18 DIAGNOSIS — M9903 Segmental and somatic dysfunction of lumbar region: Secondary | ICD-10-CM | POA: Diagnosis not present

## 2019-05-18 DIAGNOSIS — S338XXA Sprain of other parts of lumbar spine and pelvis, initial encounter: Secondary | ICD-10-CM | POA: Diagnosis not present

## 2019-05-19 DIAGNOSIS — S338XXA Sprain of other parts of lumbar spine and pelvis, initial encounter: Secondary | ICD-10-CM | POA: Diagnosis not present

## 2019-05-19 DIAGNOSIS — M9903 Segmental and somatic dysfunction of lumbar region: Secondary | ICD-10-CM | POA: Diagnosis not present

## 2019-05-21 DIAGNOSIS — Z23 Encounter for immunization: Secondary | ICD-10-CM | POA: Diagnosis not present

## 2019-06-02 DIAGNOSIS — N1831 Chronic kidney disease, stage 3a: Secondary | ICD-10-CM | POA: Diagnosis not present

## 2019-06-02 DIAGNOSIS — D631 Anemia in chronic kidney disease: Secondary | ICD-10-CM | POA: Diagnosis not present

## 2019-06-02 DIAGNOSIS — R809 Proteinuria, unspecified: Secondary | ICD-10-CM | POA: Diagnosis not present

## 2019-06-02 DIAGNOSIS — N178 Other acute kidney failure: Secondary | ICD-10-CM | POA: Diagnosis not present

## 2019-06-11 ENCOUNTER — Other Ambulatory Visit: Payer: Self-pay | Admitting: Urology

## 2019-06-11 DIAGNOSIS — R972 Elevated prostate specific antigen [PSA]: Secondary | ICD-10-CM | POA: Diagnosis not present

## 2019-06-12 DIAGNOSIS — N1831 Chronic kidney disease, stage 3a: Secondary | ICD-10-CM | POA: Diagnosis not present

## 2019-06-12 DIAGNOSIS — E559 Vitamin D deficiency, unspecified: Secondary | ICD-10-CM | POA: Diagnosis not present

## 2019-06-12 DIAGNOSIS — I129 Hypertensive chronic kidney disease with stage 1 through stage 4 chronic kidney disease, or unspecified chronic kidney disease: Secondary | ICD-10-CM | POA: Diagnosis not present

## 2019-06-12 DIAGNOSIS — Z79899 Other long term (current) drug therapy: Secondary | ICD-10-CM | POA: Diagnosis not present

## 2019-06-12 DIAGNOSIS — R600 Localized edema: Secondary | ICD-10-CM | POA: Diagnosis not present

## 2019-06-12 DIAGNOSIS — I517 Cardiomegaly: Secondary | ICD-10-CM | POA: Diagnosis not present

## 2019-06-12 LAB — PSA: PSA: 4.8 ng/mL — ABNORMAL HIGH (ref <=4.0)

## 2019-06-16 ENCOUNTER — Other Ambulatory Visit: Payer: Self-pay

## 2019-06-16 ENCOUNTER — Encounter: Payer: Self-pay | Admitting: Urology

## 2019-06-16 ENCOUNTER — Ambulatory Visit (INDEPENDENT_AMBULATORY_CARE_PROVIDER_SITE_OTHER): Payer: Medicare Other | Admitting: Urology

## 2019-06-16 VITALS — BP 141/64 | HR 75 | Temp 97.2°F | Ht 70.0 in | Wt 177.0 lb

## 2019-06-16 DIAGNOSIS — R972 Elevated prostate specific antigen [PSA]: Secondary | ICD-10-CM | POA: Diagnosis not present

## 2019-06-16 DIAGNOSIS — I2510 Atherosclerotic heart disease of native coronary artery without angina pectoris: Secondary | ICD-10-CM

## 2019-06-16 LAB — POCT URINALYSIS DIPSTICK
Bilirubin, UA: NEGATIVE
Blood, UA: NEGATIVE
Glucose, UA: NEGATIVE
Ketones, UA: NEGATIVE
Leukocytes, UA: NEGATIVE
Nitrite, UA: NEGATIVE
Protein, UA: NEGATIVE
Spec Grav, UA: 1.015 (ref 1.010–1.025)
Urobilinogen, UA: NEGATIVE E.U./dL — AB
pH, UA: 5 (ref 5.0–8.0)

## 2019-06-16 NOTE — Progress Notes (Signed)
Urological Symptom Review  Patient is experiencing the following symptoms: Frequent urination Get up at night to urinate   Review of Systems  Gastrointestinal (upper)  : Negative for upper GI symptoms  Gastrointestinal (lower) : Negative for lower GI symptoms  Constitutional : Negative for symptoms  Skin: Negative for skin symptoms  Eyes: Negative for eye symptoms  Ear/Nose/Throat : Negative for Ear/Nose/Throat symptoms  Hematologic/Lymphatic: Negative for Hematologic/Lymphatic symptoms  Cardiovascular : Leg swelling  Respiratory : Negative for respiratory symptoms  Endocrine: Negative for endocrine symptoms  Musculoskeletal: Negative for musculoskeletal symptoms  Neurological: Negative for neurological symptoms  Psychologic: Negative for psychiatric symptoms   

## 2019-06-16 NOTE — Progress Notes (Signed)
H&P  Chief Complaint: Elevated PSA  History of Present Illness:   3.2.2021: He returns today for follow-up. Most recent PSA was 4.8 -- this is on finasteride. He also continues on tamsulosin reporting very tolerable urinary sx's that are not at all bothersome or disruptive. He does report that he was recently started on lasix and knows this is increasing his urinary frequency -- he feels like he will eventually get used to this. He attributes the nocturia he has primarily to his poor sleep quality -- he is on Ambien and notes that he always first wakes up to urinate when this medication starts to wear off.   IPSS Questionnaire (AUA-7): Over the past month.   1)  How often have you had a sensation of not emptying your bladder completely after you finish urinating?  0 - Not at all  2)  How often have you had to urinate again less than two hours after you finished urinating? 1 - Less than 1 time in 5  3)  How often have you found you stopped and started again several times when you urinated?  0 - Not at all  4) How difficult have you found it to postpone urination?  0 - Not at all  5) How often have you had a weak urinary stream?  0 - Not at all  6) How often have you had to push or strain to begin urination?  0 - Not at all  7) How many times did you most typically get up to urinate from the time you went to bed until the time you got up in the morning?  3 - 3 times  Total score:  0-7 mildly symptomatic   8-19 moderately symptomatic   20-35 severely symptomatic   Total: 4 QoL: 3  (below copied from [facility] records):  PSA: [name] is a 74 year-old male established patient who is here for follow up for further evaluation of his elevated PSA.  His last PSA was performed 09/09/2018. The last PSA value was 2.7. The patient states he does take 5 alpha reductase inhibitor medication.   He has had a prostate biopsy done. Patient does not have a family history of prostate cancer.   He had a  negative biopsy in Aug 2014. At that time, prostatic volume was 90 mL. He is on finasteride. Since started, PSA has appropriately decreased.   6.2.2020: Most recent PSA 2.7 (corrected 5.4)     09/09/18 07/23/17 01/16/17 01/13/16 01/13/16 12/15/14 02/09/14 04/08/13  PSA  Total PSA 2.7 ng/dl 3.3 ng/dl 2.7 ng/dl 3.4 ng/dl 3.4 ng/dl 4.08  5.14  9.64   Free PSA      0.97   1.78   % Free PSA      24   18    BPH: Patient is currently treated with Finasteride for his symptoms.   He is on finasteride   Erectile Dysfunction: He first stated noticing pain on approximately 01/14/2014. His symptoms did begin gradually. His symptoms have been worse over the last year.   He does have difficulties achieving an erection. He does have problems maintaining his erections. His erections are straight. He has tried Viagra.   He does not have trouble reaching climax.   NO interst in Rx for this at this time.  Kidney Stones:  He has passed 3 stones since his last visit in April 2019.  No current stone pain.  Usually takes 3-4 days to pass them.   His CT  scan in February (2020) apparently showed diverticulitis and he was told that he was passing a right-sided kidney stone at that time.    Past Medical History:  Diagnosis Date  . Arteriosclerotic cardiovascular disease (ASCVD)    CABG in 08/1998  (LIMA-mLAD, SVG-rPDA, SVG-OM3-with Y SVG-lateral OM3.)  . Atypical nevus 11/29/2005   slight atypia - left hand  . Basal cell carcinoma of skin 03/22/2004   Left scapula, superior - CX3 + 5FU  . Basal cell carcinoma of skin 03/22/2004   Left scapula, inferior - CX3 + excision  . Basal cell carcinoma of skin 03/22/2004   Center mid back - CX3 + 5FU  . Basal cell carcinoma of skin 07/24/2004   Left shin - CX3 + 5FU  . Basal cell carcinoma of skin 11/29/2005   Left shoulder - CX3 + 5FU  . BPH (benign prostatic hypertrophy)    h/x of prostatis/ UTI in 2006  . Chronic kidney disease, stage II (mild) 2010    Creatinine of 1.5 in 2010  . GERD (gastroesophageal reflux disease)    s/p espohageal dilatatin and repair of hiatal hernia  . Hyperlipidemia   . Hypertension   . IBS (irritable bowel syndrome)   . PONV (postoperative nausea and vomiting)   . Sigmoid diverticulosis   . 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   Right hand - CX3 + 5FU  . 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   Right ear - CX3 + 5FU  . Squamous cell carcinoma of skin 01/21/2006   Upper mid back - tx p bx    Past Surgical History:  Procedure Laterality Date  . BREAST BIOPSY Right 08/17/2013   Procedure: RIGHT BREAST BIOPSY;  Surgeon: [name], MD;  Location: [facility];  Service: General;  Laterality: Right;  . CHOLECYSTECTOMY  1990s  . COLONOSCOPY  03/17/2012   Rourk; colonic diverticulosis, repeat screening colonoscopy in 10 years.  . CORONARY ARTERY BYPASS GRAFT  2000  . ESOPHAGOGASTRODUODENOSCOPY  May 2009   Rourk: Food impaction, critical esophageal ring/stricture requiring dilation  . HEMORRHOID SURGERY  2007   total of three  . HIATAL HERNIA REPAIR  1995  . LEFT HEART CATH AND CORS/GRAFTS ANGIOGRAPHY N/A 08/23/2016   Procedure: Left Heart Cath and Cors/Grafts Angiography;  Surgeon: Leonie Man, MD;  Location: [facility];  Service: Cardiovascular;  Laterality: N/A;  . LUMBAR LAMINECTOMY  1999    Home Medications:  Allergies as of 06/16/2019      Reactions   Statins Other (See Comments)   Severe muscle pain   Codeine Nausea And Vomiting   Demerol [meperidine] Nausea And Vomiting   Hydralazine    neuropathy    Nalbuphine Nausea And Vomiting      Medication List       Accurate as of June 16, 2019 10:05 AM. If you have any questions, ask your nurse or doctor.        acetaminophen 325 MG tablet Commonly known as: TYLENOL Take 650 mg by mouth every 6 (six) hours as needed for moderate pain.   ALLERGY EYE DROPS OP Apply to eye as needed.   amLODipine 10 MG tablet Commonly known  as: NORVASC TAKE 1 TABLET BY MOUTH EVERY DAY What changed: Another medication with the same name was removed. Continue taking this medication, and follow the directions you see here. Changed by: Jorja Loa, MD   aspirin 81 MG EC tablet Take 81 mg by mouth at bedtime.  Bempedoic Acid 180 MG Tabs Take by mouth.   cholecalciferol 25 MCG (1000 UNIT) tablet Commonly known as: VITAMIN D3 Take 1,000 Units by mouth daily.   finasteride 5 MG tablet Commonly known as: PROSCAR Take 5 mg by mouth every evening.   furosemide 20 MG tablet Commonly known as: LASIX   Investigational - Study Medication Take 1 tablet by mouth every morning. Study name:Bempedoic Acid 180mg  Additional study details:Cholesterol medication   isosorbide mononitrate 30 MG 24 hr tablet Commonly known as: IMDUR TAKE 0.5 TABLETS (15 MG TOTAL) BY MOUTH DAILY.   omeprazole 20 MG capsule Commonly known as: PRILOSEC Take 20 mg by mouth 2 (two) times daily. Before LUNCH and Before SUPPER   PROBIOTIC DAILY PO Take 1 capsule by mouth daily.   psyllium 58.6 % powder Commonly known as: METAMUCIL Take 1 packet by mouth daily.   tamsulosin 0.4 MG Caps capsule Commonly known as: FLOMAX Take 0.4 mg by mouth at bedtime.   traMADol 50 MG tablet Commonly known as: ULTRAM Take 50 mg by mouth every 6 (six) hours as needed.   zolpidem 10 MG tablet Commonly known as: AMBIEN Take 5 tablets by mouth at bedtime.       Allergies:  Allergies  Allergen Reactions  . Statins Other (See Comments)    Severe muscle pain  . Codeine Nausea And Vomiting  . Demerol [Meperidine] Nausea And Vomiting  . Hydralazine     neuropathy   . Nalbuphine Nausea And Vomiting    Family History  Problem Relation Age of Onset  . Coronary artery disease Father        also grandfather  . Colon cancer Neg Hx     Social History:  reports that he has never smoked. He has never used smokeless tobacco. He reports current alcohol use  of about 2.0 standard drinks of alcohol per week. He reports that he does not use drugs.  ROS: A complete review of systems was performed.  All systems are negative except for pertinent findings as noted.  Physical Exam:  Vital signs in last 24 hours: BP (!) 141/64   Pulse 75   Temp (!) 97.2 F (36.2 C)   Ht 5\' 10"  (1.778 m)   Wt 177 lb (80.3 kg)   BMI 25.40 kg/m  Constitutional:  Alert and oriented, No acute distress Cardiovascular: Regular rate  Respiratory: Normal respiratory effort GI: Abdomen is soft, nontender, nondistended, no abdominal masses. No CVAT. No hernias. Genitourinary: Normal male phallus, testes are descended bilaterally and non-tender and without masses, scrotum is normal in appearance without lesions or masses, perineum is normal on inspection. Prostate feels around 60 g in size, no nodularity or asymmetry Lymphatic: No lymphadenopathy Neurologic: Grossly intact, no focal deficits Psychiatric: Normal mood and affect  Laboratory Data:  No results for input(s): WBC, HGB, HCT, PLT in the last 72 hours.  No results for input(s): NA, K, CL, GLUCOSE, BUN, CALCIUM, CREATININE in the last 72 hours.  Invalid input(s): CO3   Results for orders placed or performed in visit on 06/16/19 (from the past 24 hour(s))  POCT urinalysis dipstick     Status: Abnormal   Collection Time: 06/16/19  9:25 AM  Result Value Ref Range   Color, UA yellow    Clarity, UA clear    Glucose, UA Negative Negative   Bilirubin, UA neg    Ketones, UA neg    Spec Grav, UA 1.015 1.010 - 1.025   Blood, UA neg  pH, UA 5.0 5.0 - 8.0   Protein, UA Negative Negative   Urobilinogen, UA negative (A) 0.2 or 1.0 E.U./dL   Nitrite, UA neg    Leukocytes, UA Negative Negative   Appearance clear    Odor     No results found for this or any previous visit (from the past 240 hour(s)).  Renal Function: No results for input(s): CREATININE in the last 168 hours. CrCl cannot be calculated  (Patient's most recent lab result is older than the maximum 21 days allowed.).  Radiologic Imaging: No results found.  Impression/Assessment:  His PSA is significantly elevated from last visit -- nearly doubled while on finasteride. We will recheck this in 2 mo's and plan appropriate follow-up pending those results. His DRE today is normal w/ no signs of malignancy and is seemingly stable from when he was last examined.  He remains on dual medical therapy and feels like this manages his sx's well. I agree with him that his nocturia is largely secondary to his sleep disorder. I think it is appropriate for him to continue on these medications and to try some minor behavioral/dietary changes to help alleviate his sx's. The lasix he was just started on should also help.  Plan:  1. PSA (lab visit only) in 2 mo's  2. Pending results, will call to plan appropriate follow-up. If his PSA remains elevated, I think a prostate MRI would be an appropriate next step for evaluation.   3. Continue on dual medical therapy.   4. If his PSA returns back down to his normal range, I will see him back in 6 mo's.

## 2019-06-18 ENCOUNTER — Encounter (HOSPITAL_COMMUNITY): Payer: Self-pay

## 2019-06-18 ENCOUNTER — Emergency Department (HOSPITAL_COMMUNITY)
Admission: EM | Admit: 2019-06-18 | Discharge: 2019-06-18 | Disposition: A | Discharge status: Home / Self Care | Payer: Medicare Other | Attending: Emergency Medicine | Admitting: Emergency Medicine

## 2019-06-18 ENCOUNTER — Other Ambulatory Visit: Payer: Self-pay

## 2019-06-18 DIAGNOSIS — R0789 Other chest pain: Secondary | ICD-10-CM | POA: Diagnosis not present

## 2019-06-18 DIAGNOSIS — I2510 Atherosclerotic heart disease of native coronary artery without angina pectoris: Secondary | ICD-10-CM | POA: Insufficient documentation

## 2019-06-18 DIAGNOSIS — N182 Chronic kidney disease, stage 2 (mild): Secondary | ICD-10-CM | POA: Insufficient documentation

## 2019-06-18 DIAGNOSIS — I1310 Hypertensive heart and chronic kidney disease without heart failure, with stage 1 through stage 4 chronic kidney disease, or unspecified chronic kidney disease: Secondary | ICD-10-CM | POA: Diagnosis not present

## 2019-06-18 DIAGNOSIS — R6883 Chills (without fever): Secondary | ICD-10-CM

## 2019-06-18 DIAGNOSIS — Z79899 Other long term (current) drug therapy: Secondary | ICD-10-CM | POA: Diagnosis not present

## 2019-06-18 DIAGNOSIS — R079 Chest pain, unspecified: Secondary | ICD-10-CM

## 2019-06-18 DIAGNOSIS — R519 Headache, unspecified: Secondary | ICD-10-CM

## 2019-06-18 DIAGNOSIS — Z20822 Contact with and (suspected) exposure to covid-19: Secondary | ICD-10-CM | POA: Insufficient documentation

## 2019-06-18 DIAGNOSIS — Z951 Presence of aortocoronary bypass graft: Secondary | ICD-10-CM | POA: Diagnosis not present

## 2019-06-18 LAB — CBC WITH DIFFERENTIAL/PLATELET
Abs Immature Granulocytes: 0.02 10*3/uL (ref 0.00–0.07)
Basophils Absolute: 0 10*3/uL (ref 0.0–0.1)
Basophils Relative: 1 %
Eosinophils Absolute: 0.1 10*3/uL (ref 0.0–0.5)
Eosinophils Relative: 1 %
HCT: 44.6 % (ref 39.0–52.0)
Hemoglobin: 15 g/dL (ref 13.0–17.0)
Immature Granulocytes: 0 %
Lymphocytes Relative: 23 %
Lymphs Abs: 1.5 10*3/uL (ref 0.7–4.0)
MCH: 33.2 pg (ref 26.0–34.0)
MCHC: 33.6 g/dL (ref 30.0–36.0)
MCV: 98.7 fL (ref 80.0–100.0)
Monocytes Absolute: 0.7 10*3/uL (ref 0.1–1.0)
Monocytes Relative: 10 %
Neutro Abs: 4.5 10*3/uL (ref 1.7–7.7)
Neutrophils Relative %: 65 %
Platelets: 234 10*3/uL (ref 150–400)
RBC: 4.52 MIL/uL (ref 4.22–5.81)
RDW: 12.5 % (ref 11.5–15.5)
WBC: 6.8 10*3/uL (ref 4.0–10.5)
nRBC: 0 % (ref 0.0–0.2)

## 2019-06-18 LAB — URINALYSIS, ROUTINE W REFLEX MICROSCOPIC
Bilirubin Urine: NEGATIVE
Glucose, UA: NEGATIVE mg/dL
Hgb urine dipstick: NEGATIVE
Ketones, ur: NEGATIVE mg/dL
Leukocytes,Ua: NEGATIVE
Nitrite: NEGATIVE
Protein, ur: NEGATIVE mg/dL
Specific Gravity, Urine: 1.005 (ref 1.005–1.030)
pH: 6 (ref 5.0–8.0)

## 2019-06-18 LAB — COMPREHENSIVE METABOLIC PANEL
ALT: 18 U/L (ref 0–44)
AST: 23 U/L (ref 15–41)
Albumin: 4.5 g/dL (ref 3.5–5.0)
Alkaline Phosphatase: 48 U/L (ref 38–126)
Anion gap: 9 (ref 5–15)
BUN: 27 mg/dL — ABNORMAL HIGH (ref 8–23)
CO2: 29 mmol/L (ref 22–32)
Calcium: 10 mg/dL (ref 8.9–10.3)
Chloride: 99 mmol/L (ref 98–111)
Creatinine, Ser: 1.56 mg/dL — ABNORMAL HIGH (ref 0.61–1.24)
GFR calc Af Amer: 51 mL/min — ABNORMAL LOW (ref >60)
GFR calc non Af Amer: 44 mL/min — ABNORMAL LOW (ref >60)
Glucose, Bld: 107 mg/dL — ABNORMAL HIGH (ref 70–99)
Potassium: 4.1 mmol/L (ref 3.5–5.1)
Sodium: 137 mmol/L (ref 135–145)
Total Bilirubin: 1.1 mg/dL (ref 0.3–1.2)
Total Protein: 7.5 g/dL (ref 6.5–8.1)

## 2019-06-18 LAB — TROPONIN I (HIGH SENSITIVITY): Troponin I (High Sensitivity): 5 ng/L (ref <18)

## 2019-06-18 LAB — SARS CORONAVIRUS 2 (TAT 6-24 HRS): SARS Coronavirus 2: NEGATIVE

## 2019-06-18 MED ORDER — ACETAMINOPHEN 500 MG PO TABS
1000.0000 mg | ORAL_TABLET | Freq: Once | ORAL | Status: AC
  Administered 2019-06-18: 1000 mg via ORAL
  Filled 2019-06-18: qty 2

## 2019-06-18 NOTE — Discharge Instructions (Addendum)
Test showed no life-threatening condition.  Your Covid test is pending.  Stable for discharge.

## 2019-06-18 NOTE — ED Notes (Signed)
Patient denies pain and is resting comfortably.  

## 2019-06-18 NOTE — ED Triage Notes (Signed)
PT reports started taking lasix last Friday and reports  having a dull headache since Sunday.  Also reports chills and some intermittent chest pain since Monday.  Pt says is supposed to have his second covid shot tomorrow and his kidney doctor wanted him evaluated to make sure he didn't have covid and to recheck potassium.

## 2019-06-18 NOTE — ED Provider Notes (Addendum)
[facility] Provider Note   CSN: [id number] Arrival date & time: 06/18/19  [id number]     History Chief Complaint  Patient presents with  . Headache  . Chest Pain    [name] is a 74 y.o. male.  Chief complaint headache, chills, right-sided chest pain.  Symptoms started this past Friday after a new prescription for Lasix was given for peripheral edema.  Patient has had one Covid shot and is scheduled for a second 1 tomorrow.  His edema has improved.  Other symptoms are fleeting and not associated with activity.  Severity is minimal.  Nothing makes symptoms better or worse.        Past Medical History:  Diagnosis Date  . Arteriosclerotic cardiovascular disease (ASCVD)    CABG in 08/1998  (LIMA-mLAD, SVG-rPDA, SVG-OM3-with Y SVG-lateral OM3.)  . Atypical nevus 11/29/2005   slight atypia - left hand  . Basal cell carcinoma of skin 03/22/2004   Left scapula, superior - CX3 + 5FU  . Basal cell carcinoma of skin 03/22/2004   Left scapula, inferior - CX3 + excision  . Basal cell carcinoma of skin 03/22/2004   Center mid back - CX3 + 5FU  . Basal cell carcinoma of skin 07/24/2004   Left shin - CX3 + 5FU  . Basal cell carcinoma of skin 11/29/2005   Left shoulder - CX3 + 5FU  . BPH (benign prostatic hypertrophy)    h/x of prostatis/ UTI in 2006  . Chronic kidney disease, stage II (mild) 2010   Creatinine of 1.5 in 2010  . GERD (gastroesophageal reflux disease)    s/p espohageal dilatatin and repair of hiatal hernia  . Hyperlipidemia   . Hypertension   . IBS (irritable bowel syndrome)   . PONV (postoperative nausea and vomiting)   . Sigmoid diverticulosis   . 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   Right hand - CX3 + 5FU  . 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   Right ear - CX3 + 5FU  . Squamous cell carcinoma of skin 01/21/2006   Upper mid back - tx p bx    Patient Active Problem List   Diagnosis Date Noted  . NSTEMI (non-ST elevated  myocardial infarction) ([place]) 08/22/2016  . IBS (irritable bowel syndrome) 08/03/2013  . Chest pain on exertion 07/16/2012  . Acute on chronic renal failure ([place]) 07/16/2012  . Hyponatremia 07/16/2012  . Hx of CABG   . GERD (gastroesophageal reflux disease)   . Hypertension   . Dyslipidemia   . BPH (benign prostatic hypertrophy)   . CRI (chronic renal insufficiency), stage 3 (moderate) 04/05/2009    Past Surgical History:  Procedure Laterality Date  . BREAST BIOPSY Right 08/17/2013   Procedure: RIGHT BREAST BIOPSY;  Surgeon: [name], MD;  Location: [facility];  Service: General;  Laterality: Right;  . CHOLECYSTECTOMY  1990s  . COLONOSCOPY  03/17/2012   Rourk; colonic diverticulosis, repeat screening colonoscopy in 10 years.  . CORONARY ARTERY BYPASS GRAFT  2000  . ESOPHAGOGASTRODUODENOSCOPY  May 2009   Rourk: Food impaction, critical esophageal ring/stricture requiring dilation  . HEMORRHOID SURGERY  2007   total of three  . HIATAL HERNIA REPAIR  1995  . LEFT HEART CATH AND CORS/GRAFTS ANGIOGRAPHY N/A 08/23/2016   Procedure: Left Heart Cath and Cors/Grafts Angiography;  Surgeon: Leonie Man, MD;  Location: [facility];  Service: Cardiovascular;  Laterality: N/A;  . [place]  Family History  Problem Relation Age of Onset  . Coronary artery disease Father        also grandfather  . Colon cancer Neg Hx     Social History   Tobacco Use  . Smoking status: Never Smoker  . Smokeless tobacco: Never Used  . Tobacco comment: no use of tobacco products  Substance Use Topics  . Alcohol use: Yes    Alcohol/week: 2.0 standard drinks    Types: 2 Standard drinks or equivalent per week    Comment: "Occasionally"  . Drug use: No    Home Medications Prior to Admission medications   Medication Sig Start Date End Date Taking? Authorizing Provider  acetaminophen (TYLENOL) 325 MG tablet Take 650 mg by mouth every 6 (six) hours as needed for moderate  pain.    [provider]  amLODipine (NORVASC) 10 MG tablet TAKE 1 TABLET BY MOUTH EVERY DAY 02/17/19   Arnoldo Lenis, MD  aspirin 81 MG EC tablet Take 81 mg by mouth at bedtime.     [provider]  Bempedoic Acid 180 MG TABS Take by mouth.    [provider]  cholecalciferol (VITAMIN D3) 25 MCG (1000 UNIT) tablet Take 1,000 Units by mouth daily.    [provider]  finasteride (PROSCAR) 5 MG tablet Take 5 mg by mouth every evening.    [provider]  furosemide (LASIX) 20 MG tablet  06/12/19   [provider]  Investigational - Study Medication Take 1 tablet by mouth every morning. Study name:Bempedoic Acid 180mg  Additional study details:Cholesterol medication    [provider]  isosorbide mononitrate (IMDUR) 30 MG 24 hr tablet TAKE 0.5 TABLETS (15 MG TOTAL) BY MOUTH DAILY. 02/23/19   Arnoldo Lenis, MD  Ketotifen Fumarate (ALLERGY EYE DROPS OP) Apply to eye as needed.    [provider]  omeprazole (PRILOSEC) 20 MG capsule Take 20 mg by mouth 2 (two) times daily. Before LUNCH and Before SUPPER    [provider]  Probiotic Product (PROBIOTIC DAILY PO) Take 1 capsule by mouth daily.     [provider]  psyllium (METAMUCIL) 58.6 % powder Take 1 packet by mouth daily.    [provider]  tamsulosin (FLOMAX) 0.4 MG CAPS capsule Take 0.4 mg by mouth at bedtime.    [provider]  traMADol (ULTRAM) 50 MG tablet Take 50 mg by mouth every 6 (six) hours as needed. 02/24/19   [provider]  zolpidem (AMBIEN) 10 MG tablet Take 5 tablets by mouth at bedtime. 04/26/18   [provider]    Allergies    Statins, Codeine, Demerol [meperidine], Hydralazine, and Nalbuphine  Review of Systems   Review of Systems  All other systems reviewed and are negative.   Physical Exam Updated Vital Signs BP 129/73   Pulse 61   Temp 97.9 F (36.6 C) (Oral)   Resp 17   Ht 5'  10" (1.778 m)   Wt 80.3 kg   SpO2 95%   BMI 25.40 kg/m   Physical Exam Vitals and nursing note reviewed.  Constitutional:      Appearance: He is well-developed.     Comments: nad  HENT:     Head: Normocephalic and atraumatic.  Eyes:     Conjunctiva/sclera: Conjunctivae normal.  Cardiovascular:     Rate and Rhythm: Normal rate and regular rhythm.  Pulmonary:     Effort: Pulmonary effort is normal.     Breath sounds:  Normal breath sounds.  Abdominal:     General: Bowel sounds are normal.     Palpations: Abdomen is soft.  Musculoskeletal:        General: Normal range of motion.     Cervical back: Neck supple.  Skin:    General: Skin is warm and dry.     Comments: No peripheral edema.  Neurological:     General: No focal deficit present.     Mental Status: He is alert and oriented to person, place, and time.  Psychiatric:        Behavior: Behavior normal.     ED Results / Procedures / Treatments   Labs (all labs ordered are listed, but only abnormal results are displayed) Labs Reviewed  COMPREHENSIVE METABOLIC PANEL - Abnormal; Notable for the following components:      Result Value   Glucose, Bld 107 (*)    BUN 27 (*)    Creatinine, Ser 1.56 (*)    GFR calc non Af Amer 44 (*)    GFR calc Af Amer 51 (*)    All other components within normal limits  SARS CORONAVIRUS 2 (TAT 6-24 HRS)  CBC WITH DIFFERENTIAL/PLATELET  URINALYSIS, ROUTINE W REFLEX MICROSCOPIC  TROPONIN I (HIGH SENSITIVITY)    EKG EKG Interpretation  Date/Time:  Thursday June 18 2019 09:47:09 EST Ventricular Rate:  68 PR Interval:    QRS Duration: 124 QT Interval:  415 QTC Calculation: 442 R Axis:   36 Text Interpretation: Sinus rhythm Right bundle branch block Abnormal inferior Q waves Confirmed by Nat Christen [phone]) on 06/18/2019 10:09:57 AM   Radiology No results found.  Procedures Procedures (including critical care time)  Medications Ordered in ED Medications  acetaminophen  (TYLENOL) tablet 1,000 mg (1,000 mg Oral Given 06/18/19 1147)    ED Course  I have reviewed the triage vital signs and the nursing notes.  Pertinent labs & imaging results that were available during my care of the patient were reviewed by me and considered in my medical decision making (see chart for details).    MDM Rules/Calculators/A&P                      Patient seems to be most concerned about Covid.  We will check Covid test, basic labs, ua.  He is in no acute distress.  1245: Patient rechecked prior to discharge.  He is hemodynamically stable.  Stable for outpatient management. Final Clinical Impression(s) / ED Diagnoses Final diagnoses:  Intractable headache, unspecified chronicity pattern, unspecified headache type  Chills  Chest pain, unspecified type    Rx / DC Orders ED Discharge Orders    None       Nat Christen, MD 06/18/19 1023    Nat Christen, MD 06/18/19 1247

## 2019-06-19 DIAGNOSIS — Z23 Encounter for immunization: Secondary | ICD-10-CM | POA: Diagnosis not present

## 2019-06-22 ENCOUNTER — Telehealth (INDEPENDENT_AMBULATORY_CARE_PROVIDER_SITE_OTHER): Payer: Medicare Other | Admitting: Cardiology

## 2019-06-22 ENCOUNTER — Encounter: Payer: Self-pay | Admitting: Cardiology

## 2019-06-22 VITALS — BP 138/68 | HR 66 | Ht 70.0 in | Wt 177.0 lb

## 2019-06-22 DIAGNOSIS — I2510 Atherosclerotic heart disease of native coronary artery without angina pectoris: Secondary | ICD-10-CM

## 2019-06-22 DIAGNOSIS — I10 Essential (primary) hypertension: Secondary | ICD-10-CM

## 2019-06-22 DIAGNOSIS — R600 Localized edema: Secondary | ICD-10-CM

## 2019-06-22 NOTE — Progress Notes (Signed)
Virtual Visit via Telephone Note   This visit type was conducted due to national recommendations for restrictions regarding the COVID-19 Pandemic (e.g. social distancing) in an effort to limit this patient's exposure and mitigate transmission in our community.  Due to his co-morbid illnesses, this patient is at least at moderate risk for complications without adequate follow up.  This format is felt to be most appropriate for this patient at this time.  The patient did not have access to video technology/had technical difficulties with video requiring transitioning to audio format only (telephone).  All issues noted in this document were discussed and addressed.  No physical exam could be performed with this format.  Please refer to the patient's chart for his  consent to telehealth for [facility].   The patient was identified using 2 identifiers.  Date:  06/22/2019   ID:  [name] 1946/01/24, MRN [id number]  Patient Location: Home Provider Location: Office  PCP:  Sharilyn Sites, MD  Cardiologist:  Carlyle Dolly, MD  Electrophysiologist:  None   Evaluation Performed:  Follow-Up Visit  Chief Complaint:  Follow up visit  History of Present Illness:    [name] is a 74 y.o. male seen today for follow up of the following medical problems.   1. CAD - CABG in 2000 - 06/2015 completed echo which showed normal LVEF. Exercise MPI he did 8 min 25 sec with no EKG changes and no ischemia by imaging.  - has not been on ACE-I since prior admission with AKI, had also been on NSAIDs at that time.   - admit 08/2016 with NSTEMI, cath as reported below, no interventional targets, managed medically. Imdur added to medical regimen and lopressor 12.5mg  bid added.    06/18/2019 ER visit with chest pain, headache, chills - described as right sided.Trop neg x 1, EKG SR no acute ischemic changes.  - COVID test neg  2. HTN - metoprolol stopped due to low HR's in 09/2017 -  prior AKI on ACE-I, though heavy NSAID use at the time also   - we previously increased norvasc to 10mg  daily. Reports some prior swelling in the past on norvasc but has tolerated reasonablly well since then - reports high sodium intake around the time of swelling. After about a day the swelling resolved on its own.    - home bp's on average 140s/70s.   3. Hyperlipidemia - leg pains on vytorin. Did not tolerate low dose pravastatin.  - he enrolled in clinical trial for cholesterol medication.Bempedoic acid trial.  - still participating in lipid study.     4. CKD - followed by nephrologyby Dr Theador Hawthorne   5. LE edema - started on lasix by nephrology. Was to take 20mg  bid x 2 weeks.  - swelling improved - nonspecific symptoms of headaches, chills, chest pains. Stopped taking.  - symptoms have resolved.  - wears compression stockings.   The patient does not have symptoms concerning for COVID-19 infection (fever, chills, cough, or new shortness of breath).    Past Medical History:  Diagnosis Date  . Arteriosclerotic cardiovascular disease (ASCVD)    CABG in 08/1998  (LIMA-mLAD, SVG-rPDA, SVG-OM3-with Y SVG-lateral OM3.)  . Atypical nevus 11/29/2005   slight atypia - left hand  . Basal cell carcinoma of skin 03/22/2004   Left scapula, superior - CX3 + 5FU  . Basal cell carcinoma of skin 03/22/2004   Left scapula, inferior - CX3 + excision  . Basal cell carcinoma of skin  03/22/2004   Center mid back - CX3 + 5FU  . Basal cell carcinoma of skin 07/24/2004   Left shin - CX3 + 5FU  . Basal cell carcinoma of skin 11/29/2005   Left shoulder - CX3 + 5FU  . BPH (benign prostatic hypertrophy)    h/x of prostatis/ UTI in 2006  . Chronic kidney disease, stage II (mild) 2010   Creatinine of 1.5 in 2010  . GERD (gastroesophageal reflux disease)    s/p espohageal dilatatin and repair of hiatal hernia  . Hyperlipidemia   . Hypertension   . IBS (irritable bowel syndrome)   .  PONV (postoperative nausea and vomiting)   . Sigmoid diverticulosis   . 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   Right hand - CX3 + 5FU  . 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   Right ear - CX3 + 5FU  . Squamous cell carcinoma of skin 01/21/2006   Upper mid back - tx p bx   Past Surgical History:  Procedure Laterality Date  . BREAST BIOPSY Right 08/17/2013   Procedure: RIGHT BREAST BIOPSY;  Surgeon: [name], MD;  Location: [facility];  Service: General;  Laterality: Right;  . CHOLECYSTECTOMY  1990s  . COLONOSCOPY  03/17/2012   [place]; colonic diverticulosis, repeat screening colonoscopy in 10 years.  . CORONARY ARTERY BYPASS GRAFT  2000  . ESOPHAGOGASTRODUODENOSCOPY  May 2009   [place]k: Food impaction, critical esophageal ring/stricture requiring dilation  . HEMORRHOID SURGERY  2007   total of three  . HIATAL HERNIA REPAIR  1995  . LEFT HEART CATH AND CORS/GRAFTS ANGIOGRAPHY N/A 08/23/2016   Procedure: Left Heart Cath and Cors/Grafts Angiography;  Surgeon: Leonie Man, MD;  Location: [facility];  Service: Cardiovascular;  Laterality: N/A;  . [place]     No outpatient medications have been marked as taking for the 06/22/19 encounter (Appointment) with Arnoldo Lenis, MD.     Allergies:   Statins, Codeine, Demerol [meperidine], Hydralazine, and Nalbuphine   Social History   Tobacco Use  . Smoking status: Never Smoker  . Smokeless tobacco: Never Used  . Tobacco comment: no use of tobacco products  Substance Use Topics  . Alcohol use: Yes    Alcohol/week: 2.0 standard drinks    Types: 2 Standard drinks or equivalent per week    Comment: "Occasionally"  . Drug use: No     Family Hx: The patient's family history includes Coronary artery disease in his father. There is no history of Colon cancer.  ROS:   Please see the history of present illness.     All other systems reviewed and are negative.   Prior CV studies:   The following  studies were reviewed today:  06/2015 echo Study Conclusions  - Left ventricle: The cavity size was normal. Wall thickness was  increased increased in a pattern of mild to moderate LVH.  Systolic function was normal. The estimated ejection fraction was  in the range of 60% to 65%. Wall motion was normal; there were no  regional wall motion abnormalities. Left ventricular diastolic  function parameters were normal. - Aortic valve: Mildly calcified annulus. Trileaflet; mildly  thickened leaflets. Valve area (VTI): 2.58 cm^2. Valve area  (Vmax): 2.89 cm^2. - Mitral valve: There was mild regurgitation. - Systemic veins: Small IVC, suggesting low RA pressures and  hypovolemia. - Technically adequate study.   06/2015 Exercise Stress MPI  Blood pressure demonstrated a hypertensive response to exercise.  The study is normal.  This is a low risk study.  The left ventricular ejection fraction is normal (55-65%).   08/2016 cath  Mid RCA lesion, 80 %stenosed. Dist RCA lesion, 100 %stenosed.  SVG-mRPDA graft was visualized by angiography and is large and anatomically normal.  Potential Culpril (not PCI Target). Ost RPDA lesion, 90 %stenosed, Ost RPDA to RPDA lesion, 70 %stenosed.- filled retrograde from SVG  Ost 3rd Mrg to 3rd Mrg lesion, 80 %stenosed.  SVG-3rd OM graft was visualized by angiography and is large and anatomically normal.  Y Graft SVG-Lat 3rd OM is moderate in size and anatomically normal. Origin lesion, 40 %stenosed.  Ost LAD to Prox LAD lesion, 80 %stenosed. Prox LAD to Mid LAD lesion, 99 %stenosed.  LIMA graft was visualized by angiography and is normal in caliber and anatomically normal.  The left ventricular systolic function is normal. The left ventricular ejection fraction is 55-65% by visual estimate.  LV end diastolic pressure is normal.  No obvious culprit lesions found. Clearly has small vessel disease involving the nongrafted diagonal  Saleem Coccia but this is not a very approachable PCI target. The other potential culprit is the severe disease in the retrograde flow from SVG-RPDA back to the PL system. Is also not PCI target.  Recommend: Optimize medical management  Labs/Other Tests and Data Reviewed:    EKG:  n/a  Recent Labs: 06/18/2019: ALT 18; BUN 27; Creatinine, Ser 1.56; Hemoglobin 15.0; Platelets 234; Potassium 4.1; Sodium 137   Recent Lipid Panel Lab Results  Component Value Date/Time   CHOL 193 08/23/2016 05:08 AM   TRIG 100 08/23/2016 05:08 AM   HDL 40 (L) 08/23/2016 05:08 AM   CHOLHDL 4.8 08/23/2016 05:08 AM   LDLCALC 133 (H) 08/23/2016 05:08 AM    Wt Readings from Last 3 Encounters:  06/18/19 177 lb (80.3 kg)  06/16/19 177 lb (80.3 kg)  01/01/19 173 lb 3.2 oz (78.6 kg)     Objective:    Vital Signs:   Today's Vitals   06/22/19 0856  BP: 138/68  Pulse: 66  Weight: 177 lb (80.3 kg)  Height: 5\' 10"  (1.778 m)   Body mass index is 25.4 kg/m. Normal affect. Normal speech pattern and tone. Comfortable, no apparent distress. No audible signs of SOB or wheezinng  ASSESSMENT & PLAN:    1. CAD -no recent cardiac symptoms, continue current meds  2. HTN -home bp's mildly above goal Bradycardia on beta blockers, AKI on ACEI, also avoiding diuretic due to renal dysfunction. Allergies to hydralazine and aldactone.  - given limitations on medications acceptable bp's at this time.   3. LE edema -continue diuertic and compression stockings - I don't think his symptoms were related to lasix, but will take prn going forward and keep [place] updated.    F/u 6 months  COVID-19 Education: The signs and symptoms of COVID-19 were discussed with the patient and how to seek care for testing (follow up with PCP or arrange E-visit).  The importance of social distancing was discussed today.  Time:   Today, I have spent 22 minutes with the patient with telehealth technology discussing the above problems.      Medication Adjustments/Labs and Tests Ordered: Current medicines are reviewed at length with the patient today.  Concerns regarding medicines are outlined above.   Tests Ordered: No orders of the defined types were placed in this encounter.   Medication Changes: No orders of the defined types were placed in this encounter.   Follow  Up:  Either In Person or Virtual in 6 month(s)  Signed, Carlyle Dolly, MD  06/22/2019 8:19 AM    [place]

## 2019-06-22 NOTE — Patient Instructions (Addendum)

## 2019-06-24 ENCOUNTER — Encounter: Payer: Self-pay | Admitting: Urology

## 2019-07-13 DIAGNOSIS — H0015 Chalazion left lower eyelid: Secondary | ICD-10-CM | POA: Diagnosis not present

## 2019-07-13 DIAGNOSIS — R7309 Other abnormal glucose: Secondary | ICD-10-CM | POA: Diagnosis not present

## 2019-07-15 DIAGNOSIS — N183 Chronic kidney disease, stage 3 unspecified: Secondary | ICD-10-CM | POA: Diagnosis not present

## 2019-07-15 DIAGNOSIS — E7849 Other hyperlipidemia: Secondary | ICD-10-CM | POA: Diagnosis not present

## 2019-07-15 DIAGNOSIS — I129 Hypertensive chronic kidney disease with stage 1 through stage 4 chronic kidney disease, or unspecified chronic kidney disease: Secondary | ICD-10-CM | POA: Diagnosis not present

## 2019-07-27 DIAGNOSIS — H0100B Unspecified blepharitis left eye, upper and lower eyelids: Secondary | ICD-10-CM | POA: Diagnosis not present

## 2019-07-27 DIAGNOSIS — H0015 Chalazion left lower eyelid: Secondary | ICD-10-CM | POA: Diagnosis not present

## 2019-07-27 DIAGNOSIS — H0100A Unspecified blepharitis right eye, upper and lower eyelids: Secondary | ICD-10-CM | POA: Diagnosis not present

## 2019-08-06 DIAGNOSIS — H0015 Chalazion left lower eyelid: Secondary | ICD-10-CM | POA: Diagnosis not present

## 2019-08-06 DIAGNOSIS — D485 Neoplasm of uncertain behavior of skin: Secondary | ICD-10-CM | POA: Diagnosis not present

## 2019-08-11 DIAGNOSIS — M1711 Unilateral primary osteoarthritis, right knee: Secondary | ICD-10-CM | POA: Diagnosis not present

## 2019-08-12 ENCOUNTER — Other Ambulatory Visit: Payer: Self-pay | Admitting: Cardiology

## 2019-08-12 DIAGNOSIS — M791 Myalgia, unspecified site: Secondary | ICD-10-CM | POA: Diagnosis not present

## 2019-08-12 DIAGNOSIS — M6790 Unspecified disorder of synovium and tendon, unspecified site: Secondary | ICD-10-CM | POA: Diagnosis not present

## 2019-08-12 DIAGNOSIS — Z6826 Body mass index (BMI) 26.0-26.9, adult: Secondary | ICD-10-CM | POA: Diagnosis not present

## 2019-08-12 DIAGNOSIS — G4700 Insomnia, unspecified: Secondary | ICD-10-CM | POA: Diagnosis not present

## 2019-08-12 DIAGNOSIS — I10 Essential (primary) hypertension: Secondary | ICD-10-CM | POA: Diagnosis not present

## 2019-08-12 DIAGNOSIS — I2510 Atherosclerotic heart disease of native coronary artery without angina pectoris: Secondary | ICD-10-CM | POA: Diagnosis not present

## 2019-08-17 ENCOUNTER — Other Ambulatory Visit: Payer: Self-pay | Admitting: Urology

## 2019-08-17 DIAGNOSIS — R972 Elevated prostate specific antigen [PSA]: Secondary | ICD-10-CM | POA: Diagnosis not present

## 2019-08-18 LAB — PSA: PSA: 2.5 ng/mL (ref <=4.0)

## 2019-08-20 DIAGNOSIS — D485 Neoplasm of uncertain behavior of skin: Secondary | ICD-10-CM | POA: Diagnosis not present

## 2019-08-20 DIAGNOSIS — H0015 Chalazion left lower eyelid: Secondary | ICD-10-CM | POA: Diagnosis not present

## 2019-08-21 ENCOUNTER — Telehealth: Payer: Self-pay | Admitting: Urology

## 2019-08-21 ENCOUNTER — Other Ambulatory Visit: Payer: Self-pay | Admitting: Urology

## 2019-08-21 NOTE — Telephone Encounter (Signed)
Pt. made aware of lab results and next f/u appt.

## 2019-08-21 NOTE — Telephone Encounter (Signed)
Pt called for his PSA results, he also wanted to know if he needed to make an appt.

## 2019-08-21 NOTE — Telephone Encounter (Signed)
Pt came by the office today. [facility]sa order gave to pt. No appt needed per last office visit note Dr. Diona Fanti.

## 2019-08-24 ENCOUNTER — Telehealth: Payer: Self-pay

## 2019-08-24 NOTE — Telephone Encounter (Signed)
Pt. Notified.

## 2019-08-25 ENCOUNTER — Other Ambulatory Visit: Payer: Self-pay

## 2019-08-25 ENCOUNTER — Ambulatory Visit (INDEPENDENT_AMBULATORY_CARE_PROVIDER_SITE_OTHER): Payer: Medicare Other | Admitting: Physician Assistant

## 2019-08-25 ENCOUNTER — Encounter: Payer: Self-pay | Admitting: Physician Assistant

## 2019-08-25 DIAGNOSIS — C44619 Basal cell carcinoma of skin of left upper limb, including shoulder: Secondary | ICD-10-CM

## 2019-08-25 DIAGNOSIS — D0430 Carcinoma in situ of skin of unspecified part of face: Secondary | ICD-10-CM

## 2019-08-25 DIAGNOSIS — D0439 Carcinoma in situ of skin of other parts of face: Secondary | ICD-10-CM

## 2019-08-25 DIAGNOSIS — C44319 Basal cell carcinoma of skin of other parts of face: Secondary | ICD-10-CM

## 2019-08-25 DIAGNOSIS — L578 Other skin changes due to chronic exposure to nonionizing radiation: Secondary | ICD-10-CM

## 2019-08-25 DIAGNOSIS — L821 Other seborrheic keratosis: Secondary | ICD-10-CM | POA: Diagnosis not present

## 2019-08-25 DIAGNOSIS — C4491 Basal cell carcinoma of skin, unspecified: Secondary | ICD-10-CM

## 2019-08-25 DIAGNOSIS — C44622 Squamous cell carcinoma of skin of right upper limb, including shoulder: Secondary | ICD-10-CM | POA: Diagnosis not present

## 2019-08-25 DIAGNOSIS — L814 Other melanin hyperpigmentation: Secondary | ICD-10-CM | POA: Diagnosis not present

## 2019-08-25 DIAGNOSIS — L570 Actinic keratosis: Secondary | ICD-10-CM

## 2019-08-25 DIAGNOSIS — Z85828 Personal history of other malignant neoplasm of skin: Secondary | ICD-10-CM

## 2019-08-25 DIAGNOSIS — D1800 Hemangioma unspecified site: Secondary | ICD-10-CM

## 2019-08-25 DIAGNOSIS — D229 Melanocytic nevi, unspecified: Secondary | ICD-10-CM

## 2019-08-25 DIAGNOSIS — Z1283 Encounter for screening for malignant neoplasm of skin: Secondary | ICD-10-CM | POA: Diagnosis not present

## 2019-08-25 DIAGNOSIS — D485 Neoplasm of uncertain behavior of skin: Secondary | ICD-10-CM

## 2019-08-25 DIAGNOSIS — C44310 Basal cell carcinoma of skin of unspecified parts of face: Secondary | ICD-10-CM

## 2019-08-25 DIAGNOSIS — C4492 Squamous cell carcinoma of skin, unspecified: Secondary | ICD-10-CM

## 2019-08-25 HISTORY — DX: Basal cell carcinoma of skin, unspecified: C44.91

## 2019-08-25 HISTORY — DX: Squamous cell carcinoma of skin, unspecified: C44.92

## 2019-08-25 MED ORDER — CLOCORTOLONE PIVALATE 0.1 % EX CREA: 1.0000 "application " | TOPICAL_CREAM | Freq: Two times a day (BID) | CUTANEOUS | 0 refills | Status: DC

## 2019-08-25 NOTE — Patient Instructions (Signed)

## 2019-08-27 NOTE — Progress Notes (Addendum)
Follow-Up Visit   Subjective  [name] is a 74 y.o. male who presents for the following: Skin Problem (Place on right outer mouth/chin x couple months that stays sore with bleeding. Also, has a crusty place on right cheek x 1 year. Has flaking but no bleeding. Does have a place that just popped up a week ago above left elbow crease. Has bled in the past week. Right middle finger is slightly swollen due to a possibly insect bite.).    The following portions of the chart were reviewed this encounter and updated as appropriate: Tobacco  Allergies  Problems  Med Hx  Surg Hx  Fam Hx      Objective  Well appearing patient in no apparent distress; mood and affect are within normal limits.  All skin waist up examined.  Objective  Left Forearm - Anterior, Right Superior Helix: Erythematous patches with gritty scale.  Objective  Right Buccal Cheek : Pearly papule with telangectasia.      Objective  Right Chin: pearly papule with arborizing vessels.      Objective  Right Elbow - Posterior: Scaly pink papule or plaque.      Objective  Left Upper Arm - Anterior: Scaly pink papule or plaque.      Objective  waist up: No atypical nevi   Objective  Head - Anterior (Face): observe  Objective  Right Breast: All scars clear  Assessment & Plan  AK (actinic keratosis) (2) Left Forearm - Anterior; Right Superior Helix  Destruction of lesion - Left Forearm - Anterior, Right Superior Helix Complexity: simple   Destruction method: cryotherapy   Informed consent: discussed and consent obtained   Timeout:  patient name, date of birth, surgical site, and procedure verified Anesthesia: the lesion was anesthetized in a standard fashion   Anesthetic:  1% lidocaine w/ epinephrine 1-100,000 local infiltration Lesion destroyed using liquid nitrogen: Yes   Cryotherapy cycles:  1 Hemostasis achieved with:  aluminum chloride Outcome: patient tolerated procedure  well with no complications   Post-procedure details: wound care instructions given   Additional details:  Wound inoculated with 5% fluorouracil solution  Carcinoma in situ of skin of face, unspecified location Right Buccal Cheek   Skin / nail biopsy Type of biopsy: tangential   Informed consent: discussed and consent obtained   Timeout: patient name, date of birth, surgical site, and procedure verified   Procedure prep:  Patient was prepped and draped in usual sterile fashion Prep type:  Chlorhexidine Anesthesia: the lesion was anesthetized in a standard fashion   Anesthetic:  1% lidocaine w/ epinephrine 1-100,000 local infiltration Instrument used: flexible razor blade   Hemostasis achieved with: aluminum chloride   Outcome: patient tolerated procedure well   Post-procedure details: wound care instructions given   Additional details:  PERFORMED ED&C AFTER BIOPSY 1.4cm  Destruction of lesion Complexity: simple   Destruction method: electrodesiccation and curettage   Informed consent: discussed and consent obtained   Timeout:  patient name, date of birth, surgical site, and procedure verified Anesthesia: the lesion was anesthetized in a standard fashion   Anesthetic:  1% lidocaine w/ epinephrine 1-100,000 local infiltration Curettage performed in three different directions: Yes   Electrodesiccation performed over the curetted area: Yes   Curettage cycles:  3 Lesion length (cm):  1.4 Lesion width (cm):  1.4 Margin per side (cm):  0.1 Final wound size (cm):  1.6 Hemostasis achieved with:  aluminum chloride Outcome: patient tolerated procedure well with no complications  Post-procedure details: wound care instructions given    Specimen 2 - Surgical pathology Differential Diagnosis: BCC vs SCC Check Margins: No  BCC (basal cell carcinoma), face Right Chin  Skin / nail biopsy Type of biopsy: tangential   Informed consent: discussed and consent obtained   Timeout: patient  name, date of birth, surgical site, and procedure verified   Procedure prep:  Patient was prepped and draped in usual sterile fashion Prep type:  Chlorhexidine Anesthesia: the lesion was anesthetized in a standard fashion   Anesthetic:  1% lidocaine w/ epinephrine 1-100,000 local infiltration Instrument used: flexible razor blade   Hemostasis achieved with: aluminum chloride   Outcome: patient tolerated procedure well   Post-procedure details: wound care instructions given   Additional details:  PERFORMED ED&C AFTER BIOPSY 1.5cm May still need MOHs depending on results.  Destruction of lesion Complexity: simple   Destruction method: electrodesiccation and curettage   Informed consent: discussed and consent obtained   Timeout:  patient name, date of birth, surgical site, and procedure verified Anesthesia: the lesion was anesthetized in a standard fashion   Anesthetic:  1% lidocaine w/ epinephrine 1-100,000 local infiltration Curettage performed in three different directions: Yes   Electrodesiccation performed over the curetted area: Yes   Curettage cycles:  3 Lesion length (cm):  1.5 Lesion width (cm):  1.5 Margin per side (cm):  0.1 Final wound size (cm):  1.7 Hemostasis achieved with:  aluminum chloride Outcome: patient tolerated procedure well with no complications   Post-procedure details: wound care instructions given    Specimen 3 - Surgical pathology Differential Diagnosis: BCC vs SCC Check Margins: No  SCC (squamous cell carcinoma), arm, right Right Elbow - Posterior  Skin / nail biopsy Type of biopsy: tangential   Informed consent: discussed and consent obtained   Timeout: patient name, date of birth, surgical site, and procedure verified   Procedure prep:  Patient was prepped and draped in usual sterile fashion Prep type:  Chlorhexidine Anesthesia: the lesion was anesthetized in a standard fashion   Anesthetic:  1% lidocaine w/ epinephrine 1-100,000 local  infiltration Instrument used: flexible razor blade   Hemostasis achieved with: aluminum chloride   Outcome: patient tolerated procedure well   Post-procedure details: wound care instructions given   Additional details:  PERFORMED ED&C AFTER BIOPSY 1.6cm  Destruction of lesion Complexity: simple   Destruction method: electrodesiccation and curettage   Informed consent: discussed and consent obtained   Timeout:  patient name, date of birth, surgical site, and procedure verified Anesthesia: the lesion was anesthetized in a standard fashion   Anesthetic:  1% lidocaine w/ epinephrine 1-100,000 local infiltration Curettage performed in three different directions: Yes   Electrodesiccation performed over the curetted area: Yes   Curettage cycles:  3 Lesion length (cm):  1.6 Lesion width (cm):  0.6 Margin per side (cm):  0.1 Final wound size (cm):  1.8 Hemostasis achieved with:  aluminum chloride Outcome: patient tolerated procedure well with no complications   Post-procedure details: wound care instructions given    Specimen 4 - Surgical pathology Differential Diagnosis: BCC vs SCC Check Margins: No  BCC (basal cell carcinoma), arm, left Left Upper Arm - Anterior  Skin / nail biopsy Type of biopsy: tangential   Informed consent: discussed and consent obtained   Timeout: patient name, date of birth, surgical site, and procedure verified   Procedure prep:  Patient was prepped and draped in usual sterile fashion Prep type:  Chlorhexidine Anesthesia: the lesion was anesthetized in  a standard fashion   Anesthetic:  1% lidocaine w/ epinephrine 1-100,000 local infiltration Instrument used: flexible razor blade   Hemostasis achieved with: aluminum chloride   Outcome: patient tolerated procedure well   Post-procedure details: wound care instructions given   Additional details:  PERFORMED ED&C AFTER BIOPSY 1.0cm   Destruction of lesion Complexity: simple   Destruction method:  electrodesiccation and curettage   Informed consent: discussed and consent obtained   Timeout:  patient name, date of birth, surgical site, and procedure verified Anesthesia: the lesion was anesthetized in a standard fashion   Anesthetic:  1% lidocaine w/ epinephrine 1-100,000 local infiltration Curettage performed in three different directions: Yes   Electrodesiccation performed over the curetted area: Yes   Curettage cycles:  3 Lesion length (cm):  1 Lesion width (cm):  1 Margin per side (cm):  0.1 Final wound size (cm):  1.2 Hemostasis achieved with:  aluminum chloride Outcome: patient tolerated procedure well with no complications   Post-procedure details: wound care instructions given    Specimen 1 - Surgical pathology Differential Diagnosis: BCC vs SCC Check Margins: No  Screening exam for skin cancer waist up  Skin examinations  History of basal cell carcinoma (BCC) Head - Anterior (Face)  Yearly skin exams  History of SCC (squamous cell carcinoma) of skin Right Breast  Skin examinations Lentigines - Scattered tan macules - Discussed due to sun exposure - Benign, observe - Call for any changes  Seborrheic Keratoses - Stuck-on, waxy, tan-brown papules and plaques  - Discussed benign etiology and prognosis. - Observe - Call for any changes  Melanocytic Nevi - Tan-brown and/or pink-flesh-colored symmetric macules and papules - Benign appearing on exam today - Observation - Call clinic for new or changing moles - Recommend daily use of broad spectrum spf 30+ sunscreen to sun-exposed areas.   Hemangiomas - Red papules - Discussed benign nature - Observe - Call for any changes  Actinic Damage - diffuse scaly erythematous macules with underlying dyspigmentation - Recommend daily broad spectrum sunscreen SPF 30+ to sun-exposed areas, reapply every 2 hours as needed.  - Call for new or changing lesions. I, Alante Tolan, PA-C, have reviewed all  documentation for this visit. The documentation on 12/14/19 for the exam, diagnosis, procedures, and orders are all accurate and complete.

## 2019-09-03 DIAGNOSIS — E559 Vitamin D deficiency, unspecified: Secondary | ICD-10-CM | POA: Diagnosis not present

## 2019-09-03 DIAGNOSIS — C441192 Basal cell carcinoma of skin of left lower eyelid, including canthus: Secondary | ICD-10-CM | POA: Diagnosis not present

## 2019-09-03 DIAGNOSIS — N1831 Chronic kidney disease, stage 3a: Secondary | ICD-10-CM | POA: Diagnosis not present

## 2019-09-03 DIAGNOSIS — H0015 Chalazion left lower eyelid: Secondary | ICD-10-CM | POA: Diagnosis not present

## 2019-09-03 DIAGNOSIS — Z09 Encounter for follow-up examination after completed treatment for conditions other than malignant neoplasm: Secondary | ICD-10-CM | POA: Diagnosis not present

## 2019-09-03 DIAGNOSIS — Z79899 Other long term (current) drug therapy: Secondary | ICD-10-CM | POA: Diagnosis not present

## 2019-09-03 DIAGNOSIS — I129 Hypertensive chronic kidney disease with stage 1 through stage 4 chronic kidney disease, or unspecified chronic kidney disease: Secondary | ICD-10-CM | POA: Diagnosis not present

## 2019-09-09 DIAGNOSIS — E559 Vitamin D deficiency, unspecified: Secondary | ICD-10-CM | POA: Diagnosis not present

## 2019-09-09 DIAGNOSIS — R600 Localized edema: Secondary | ICD-10-CM | POA: Diagnosis not present

## 2019-09-09 DIAGNOSIS — N1831 Chronic kidney disease, stage 3a: Secondary | ICD-10-CM | POA: Diagnosis not present

## 2019-09-09 DIAGNOSIS — I129 Hypertensive chronic kidney disease with stage 1 through stage 4 chronic kidney disease, or unspecified chronic kidney disease: Secondary | ICD-10-CM | POA: Diagnosis not present

## 2019-09-24 ENCOUNTER — Encounter: Payer: Self-pay | Admitting: Physician Assistant

## 2019-09-24 ENCOUNTER — Telehealth: Payer: Self-pay | Admitting: Physician Assistant

## 2019-09-24 NOTE — Telephone Encounter (Signed)
Results, KRS 

## 2019-09-24 NOTE — Telephone Encounter (Signed)
Left patient message for patient to call he needs surgery with [facility]

## 2019-09-24 NOTE — Telephone Encounter (Signed)
Path to patient 12/17/19 surgery mad and patient aware its possible not all 4 spots will be treated that day.

## 2019-10-14 DIAGNOSIS — E7849 Other hyperlipidemia: Secondary | ICD-10-CM | POA: Diagnosis not present

## 2019-10-14 DIAGNOSIS — N183 Chronic kidney disease, stage 3 unspecified: Secondary | ICD-10-CM | POA: Diagnosis not present

## 2019-10-14 DIAGNOSIS — I129 Hypertensive chronic kidney disease with stage 1 through stage 4 chronic kidney disease, or unspecified chronic kidney disease: Secondary | ICD-10-CM | POA: Diagnosis not present

## 2019-10-27 ENCOUNTER — Telehealth: Payer: Self-pay | Admitting: Urology

## 2019-10-27 ENCOUNTER — Other Ambulatory Visit: Payer: Self-pay

## 2019-10-27 MED ORDER — FINASTERIDE 5 MG PO TABS: 5.0000 mg | ORAL_TABLET | Freq: Every evening | ORAL | 5 refills | Status: DC

## 2019-10-27 NOTE — Telephone Encounter (Signed)
Pt called and states he needs refill on finasteride 5 mg to [facility] [place]. He has appt on 12/22/19 fyi.

## 2019-10-27 NOTE — Telephone Encounter (Signed)
Refill submitted. 

## 2019-11-05 DIAGNOSIS — M7661 Achilles tendinitis, right leg: Secondary | ICD-10-CM | POA: Diagnosis not present

## 2019-11-05 DIAGNOSIS — M79671 Pain in right foot: Secondary | ICD-10-CM | POA: Diagnosis not present

## 2019-11-19 DIAGNOSIS — M79671 Pain in right foot: Secondary | ICD-10-CM | POA: Diagnosis not present

## 2019-11-19 DIAGNOSIS — M7661 Achilles tendinitis, right leg: Secondary | ICD-10-CM | POA: Diagnosis not present

## 2019-11-24 ENCOUNTER — Other Ambulatory Visit: Payer: Self-pay | Admitting: Cardiology

## 2019-11-30 DIAGNOSIS — G4700 Insomnia, unspecified: Secondary | ICD-10-CM | POA: Diagnosis not present

## 2019-11-30 DIAGNOSIS — Z6826 Body mass index (BMI) 26.0-26.9, adult: Secondary | ICD-10-CM | POA: Diagnosis not present

## 2019-11-30 DIAGNOSIS — E663 Overweight: Secondary | ICD-10-CM | POA: Diagnosis not present

## 2019-12-03 ENCOUNTER — Ambulatory Visit: Payer: Medicare Other | Admitting: Physician Assistant

## 2019-12-08 DIAGNOSIS — E559 Vitamin D deficiency, unspecified: Secondary | ICD-10-CM | POA: Diagnosis not present

## 2019-12-08 DIAGNOSIS — N1831 Chronic kidney disease, stage 3a: Secondary | ICD-10-CM | POA: Diagnosis not present

## 2019-12-08 DIAGNOSIS — R600 Localized edema: Secondary | ICD-10-CM | POA: Diagnosis not present

## 2019-12-08 DIAGNOSIS — I129 Hypertensive chronic kidney disease with stage 1 through stage 4 chronic kidney disease, or unspecified chronic kidney disease: Secondary | ICD-10-CM | POA: Diagnosis not present

## 2019-12-10 DIAGNOSIS — I129 Hypertensive chronic kidney disease with stage 1 through stage 4 chronic kidney disease, or unspecified chronic kidney disease: Secondary | ICD-10-CM | POA: Diagnosis not present

## 2019-12-10 DIAGNOSIS — N1831 Chronic kidney disease, stage 3a: Secondary | ICD-10-CM | POA: Diagnosis not present

## 2019-12-10 DIAGNOSIS — E559 Vitamin D deficiency, unspecified: Secondary | ICD-10-CM | POA: Diagnosis not present

## 2019-12-14 NOTE — Addendum Note (Signed)
Addended by: Robyne Askew R on: 12/14/2019 04:17 PM   Modules accepted: Level of Service

## 2019-12-15 ENCOUNTER — Other Ambulatory Visit: Payer: Self-pay

## 2019-12-15 ENCOUNTER — Other Ambulatory Visit: Payer: Medicare Other

## 2019-12-15 DIAGNOSIS — R972 Elevated prostate specific antigen [PSA]: Secondary | ICD-10-CM | POA: Diagnosis not present

## 2019-12-16 DIAGNOSIS — C441192 Basal cell carcinoma of skin of left lower eyelid, including canthus: Secondary | ICD-10-CM | POA: Diagnosis not present

## 2019-12-16 DIAGNOSIS — Z09 Encounter for follow-up examination after completed treatment for conditions other than malignant neoplasm: Secondary | ICD-10-CM | POA: Diagnosis not present

## 2019-12-16 DIAGNOSIS — H019 Unspecified inflammation of eyelid: Secondary | ICD-10-CM | POA: Diagnosis not present

## 2019-12-16 DIAGNOSIS — H00025 Hordeolum internum left lower eyelid: Secondary | ICD-10-CM | POA: Diagnosis not present

## 2019-12-16 LAB — PSA: Prostate Specific Ag, Serum: 3.5 ng/mL (ref 0.0–4.0)

## 2019-12-17 ENCOUNTER — Other Ambulatory Visit: Payer: Self-pay

## 2019-12-17 ENCOUNTER — Ambulatory Visit (INDEPENDENT_AMBULATORY_CARE_PROVIDER_SITE_OTHER): Payer: Medicare Other | Admitting: Physician Assistant

## 2019-12-17 ENCOUNTER — Encounter: Payer: Self-pay | Admitting: Physician Assistant

## 2019-12-17 DIAGNOSIS — C44619 Basal cell carcinoma of skin of left upper limb, including shoulder: Secondary | ICD-10-CM | POA: Diagnosis not present

## 2019-12-17 DIAGNOSIS — C44319 Basal cell carcinoma of skin of other parts of face: Secondary | ICD-10-CM

## 2019-12-17 DIAGNOSIS — C44310 Basal cell carcinoma of skin of unspecified parts of face: Secondary | ICD-10-CM

## 2019-12-17 DIAGNOSIS — D0439 Carcinoma in situ of skin of other parts of face: Secondary | ICD-10-CM

## 2019-12-17 DIAGNOSIS — C44622 Squamous cell carcinoma of skin of right upper limb, including shoulder: Secondary | ICD-10-CM

## 2019-12-17 NOTE — Progress Notes (Signed)
Follow-Up Visit   Subjective  [name] is a 74 y.o. male who presents for the following: Procedure (here for tmt- left upper arm-bcc, right buccal cheek, scc, right chin-bcc, right elbow posterior-scc ).   The following portions of the chart were reviewed this encounter and updated as appropriate: Tobacco  Allergies  Meds  Problems  Med Hx  Surg Hx  Fam Hx      Objective  Well appearing patient in no apparent distress; mood and affect are within normal limits.  A focused examination was performed including face, neck, chest and back. Relevant physical exam findings are noted in the Assessment and Plan.  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   Destruction of lesion Complexity: simple   Destruction method: cryotherapy   Informed consent: discussed and consent obtained   Timeout:  patient name, date of birth, surgical site, and procedure verified Lesion destroyed using liquid nitrogen: Yes   Region frozen until ice ball extended beyond lesion: Yes   Cryotherapy cycles:  1 Final wound size (cm):  1.5 Outcome: patient tolerated procedure well with no complications   Post-procedure details: wound care instructions given    BCC (basal cell carcinoma), arm, left Left Upper Arm - Anterior  Destruction of lesion Complexity: simple   Destruction method: electrodesiccation and curettage   Informed consent: discussed and consent obtained   Timeout:  patient name, date of birth, surgical site, and procedure verified Anesthesia: the lesion was anesthetized in a standard fashion   Anesthetic:  1% lidocaine w/ epinephrine 1-100,000 local infiltration Curettage performed in three different directions: Yes   Electrodesiccation  performed over the curetted area: Yes   Curettage cycles:  3 Margin per side (cm):  0.1 Final wound size (cm):  1.1 Hemostasis achieved with:  aluminum chloride Outcome: patient tolerated procedure well with no complications   Post-procedure details: wound care instructions given    BCC (basal cell carcinoma), face right chin  Destruction of lesion Complexity: simple   Destruction method: electrodesiccation and curettage   Informed consent: discussed and consent obtained   Timeout:  patient name, date of birth, surgical site, and procedure verified Anesthesia: the lesion was anesthetized in a standard fashion   Anesthetic:  1% lidocaine w/ epinephrine 1-100,000 local infiltration Curettage performed in three different directions: Yes   Electrodesiccation performed over the curetted area: Yes   Curettage cycles:  3 Margin per side (cm):  0.1 Final wound size (cm):  1.1 Hemostasis achieved with:  aluminum chloride Outcome: patient tolerated procedure well with no complications   Post-procedure details: wound care instructions given    SCC (squamous cell carcinoma), arm, right Right Elbow - Posterior  Destruction of lesion Complexity: simple   Destruction method: electrodesiccation and curettage   Informed consent: discussed and consent obtained   Timeout:  patient name, date of birth, surgical site, and procedure verified Anesthesia: the lesion was anesthetized in a standard fashion   Anesthetic:  1% lidocaine w/ epinephrine 1-100,000 local infiltration Curettage performed in three different directions: Yes   Electrodesiccation performed over the curetted area: Yes   Curettage cycles:  3 Margin per side (cm):  0.1 Final wound size (cm):  1.5 Hemostasis achieved with:  aluminum chloride  Outcome: patient tolerated procedure well with no complications   Post-procedure details: wound care instructions given     I, Daisa Stennis, PA-C, have reviewed all documentation's for  this visit.  The documentation on 12/17/19 for the exam, diagnosis, procedures and orders are all accurate and complete.

## 2019-12-22 ENCOUNTER — Ambulatory Visit (INDEPENDENT_AMBULATORY_CARE_PROVIDER_SITE_OTHER): Payer: Medicare Other | Admitting: Urology

## 2019-12-22 ENCOUNTER — Other Ambulatory Visit: Payer: Self-pay

## 2019-12-22 ENCOUNTER — Encounter: Payer: Self-pay | Admitting: Urology

## 2019-12-22 ENCOUNTER — Ambulatory Visit: Payer: Medicare Other | Admitting: Urology

## 2019-12-22 VITALS — BP 143/73 | HR 74 | Temp 97.8°F | Wt 168.0 lb

## 2019-12-22 DIAGNOSIS — R351 Nocturia: Secondary | ICD-10-CM

## 2019-12-22 DIAGNOSIS — R972 Elevated prostate specific antigen [PSA]: Secondary | ICD-10-CM | POA: Diagnosis not present

## 2019-12-22 DIAGNOSIS — N401 Enlarged prostate with lower urinary tract symptoms: Secondary | ICD-10-CM

## 2019-12-22 DIAGNOSIS — I2510 Atherosclerotic heart disease of native coronary artery without angina pectoris: Secondary | ICD-10-CM

## 2019-12-22 DIAGNOSIS — R350 Frequency of micturition: Secondary | ICD-10-CM | POA: Diagnosis not present

## 2019-12-22 LAB — URINALYSIS, ROUTINE W REFLEX MICROSCOPIC
Bilirubin, UA: NEGATIVE
Glucose, UA: NEGATIVE
Ketones, UA: NEGATIVE
Leukocytes,UA: NEGATIVE
Nitrite, UA: NEGATIVE
Protein,UA: NEGATIVE
RBC, UA: NEGATIVE
Specific Gravity, UA: 1.01 (ref 1.005–1.030)
Urobilinogen, Ur: 0.2 mg/dL (ref 0.2–1.0)
pH, UA: 5.5 (ref 5.0–7.5)

## 2019-12-22 NOTE — Progress Notes (Signed)
H&P  Chief Complaint: Elevated PSA  History of Present Illness:   9.7.2021: Pt reports that he was placed on lasix in February and that has increased urinary frequency and nocturia. Pt taking lasix QOD PRN and continues on finasteride. Pt submits no other urinary complaints or concerns at this time.  PSA: 3.5  IPSS Questionnaire (AUA-7): Over the past month.   1)  How often have you had a sensation of not emptying your bladder completely after you finish urinating?  1 - Less than 1 time in 5  2)  How often have you had to urinate again less than two hours after you finished urinating? 1 - Less than 1 time in 5  3)  How often have you found you stopped and started again several times when you urinated?  0 - Not at all  4) How difficult have you found it to postpone urination?  0 - Not at all  5) How often have you had a weak urinary stream?  0 - Not at all  6) How often have you had to push or strain to begin urination?  0 - Not at all  7) How many times did you most typically get up to urinate from the time you went to bed until the time you got up in the morning?  3 - 3 times  Total score:  0-7 mildly symptomatic   8-19 moderately symptomatic   20-35 severely symptomatic  QOL score: 3   (below copied from [facility] records):  PSA: [name] is a 74 year-old male established patient who is here for follow up for further evaluation of his elevated PSA.  His last PSA was performed 09/09/2018. The last PSA value was 2.7. The patient states he does take 5 alpha reductase inhibitor medication.   He has had a prostate biopsy done. Patient does not have a family history of prostate cancer.   He had a negative biopsy in Aug 2014. At that time, prostatic volume was 90 mL. He is on finasteride. Since started, PSA has appropriately decreased.   6.2.2020: Most recent PSA 2.7 (corrected 5.4)   3.2.2021: He returns today for follow-up. Most recent PSA was 4.8 -- this is on finasteride. He  also continues on tamsulosin reporting very tolerable urinary sx's that are not at all bothersome or disruptive. He does report that he was recently started on lasix and knows this is increasing his urinary frequency -- he feels like he will eventually get used to this. He attributes the nocturia he has primarily to his poor sleep quality -- he is on Ambien and notes that he always first wakes up to urinate when this medication starts to wear off.   Past Medical History:  Diagnosis Date  . Arteriosclerotic cardiovascular disease (ASCVD)    CABG in 08/1998  (LIMA-mLAD, SVG-rPDA, SVG-OM3-with Y SVG-lateral OM3.)  . Atypical nevus 11/29/2005   slight atypia - left hand  . Basal cell carcinoma of skin 03/22/2004   Left scapula, superior - CX3 + 5FU  . Basal cell carcinoma of skin 03/22/2004   Left scapula, inferior - CX3 + excision  . Basal cell carcinoma of skin 03/22/2004   Center mid back - CX3 + 5FU  . Basal cell carcinoma of skin 07/24/2004   Left shin - CX3 + 5FU  . Basal cell carcinoma of skin 11/29/2005   Left shoulder - CX3 + 5FU  . Basal cell carcinoma of skin 10/22/2006   ulcerated on right outer  knee - CX3+5FU  . Basal cell carcinoma of skin 12/02/2008   left upper arm, superior - CX3+5FU  . Basal cell carcinoma of skin 05/16/2011   right shoulder  . Basal cell carcinoma of skin 05/16/2011   ulcerated above left sideburn  . Basal cell carcinoma of skin 05/16/2011   lateral left arm  . Basal cell carcinoma of skin 06/29/2015   nodular on left ear - tx p bx  . Basal cell carcinoma of skin 06/08/2016   superficial on left scapha, ear [facility])  . Basal cell carcinoma of skin 07/25/2017   nodular on left ear (MOHS)  . Basal cell carcinoma of skin 07/25/2017   nodular on left thigh - tx p bx  . Basal cell carcinoma of skin 11/18/2018   nodular on right nare - tx p bx  . BCC (basal cell carcinoma of skin) 08/25/2019   left upper arm anterior (CX35FU)  . BCC (basal cell carcinoma  of skin) 08/25/2019   right chin  . BPH (benign prostatic hypertrophy)    h/x of prostatis/ UTI in 2006  . Chronic kidney disease, stage II (mild) 2010   Creatinine of 1.5 in 2010  . GERD (gastroesophageal reflux disease)    s/p espohageal dilatatin and repair of hiatal hernia  . Hyperlipidemia   . Hypertension   . IBS (irritable bowel syndrome)   . PONV (postoperative nausea and vomiting)   . 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   right buccal cheek (CX35FU)  . 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   right elbow(CX35FU)  . 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   Right Forearm Upper(well diff) (tx p bx)  . 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   Right Index Finger(in situ) (tx p bx)  . 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   Left Forearm,upper(in situ) (tx p bx)  . 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/18/2018   Base of Left Middle Finger(in situ) (tx p bx)  . 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/18/2018   Right Outer Forearm,sup(in situ) (tx p bx)  . 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/18/2018   Right Forearm,Inf(in situ) (tx p bx)  . Sigmoid diverticulosis   . 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   in situ on right hand - CX3 + 5FU  . 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   in situ on right ear - CX3 + 5FU  . Squamous cell carcinoma of skin 01/21/2006   in situ on upper mid back - tx p bx  . Squamous cell carcinoma of skin 01/21/2006   in situ on top left shoulder - tx p bx  . Squamous cell carcinoma of skin 10/22/2006   KA under left eye - MOHs  . Squamous cell carcinoma of skin 12/02/2008   in situ on left base of 4th finger - CX3+5FU  . Squamous cell carcinoma of skin 08/30/2009   well differentiated on right jawline  . Squamous cell carcinoma of skin 08/30/2009   in situ on left upper arm - CX3+imiquimod  . Squamous cell carcinoma of skin 02/25/2014   in situ on left temple  . Squamous cell  carcinoma of skin 02/25/2014   in situ on left arm  . Squamous cell carcinoma of skin 06/08/2016   in situ on left lower side neck - tx p bx  . Squamous cell carcinoma of skin 06/08/2016   left shin - tx p bx  . Squamous cell  carcinoma of skin 06/08/2016   in situ on right temple - tx p bx  . Squamous cell carcinoma of skin 06/08/2016   in situ on right jawline - tx p bx    Past Surgical History:  Procedure Laterality Date  . BREAST BIOPSY Right 08/17/2013   Procedure: RIGHT BREAST BIOPSY;  Surgeon: [name], MD;  Location: [facility];  Service: General;  Laterality: Right;  . CHOLECYSTECTOMY  1990s  . COLONOSCOPY  03/17/2012   [place]ourk; colonic diverticulosis, repeat screening colonoscopy in 10 years.  . CORONARY ARTERY BYPASS GRAFT  2000  . ESOPHAGOGASTRODUODENOSCOPY  May 2009   Rourk: Food impaction, critical esophageal ring/stricture requiring dilation  . HEMORRHOID SURGERY  2007   total of three  . HIATAL HERNIA REPAIR  1995  . LEFT HEART CATH AND CORS/GRAFTS ANGIOGRAPHY N/A 08/23/2016   Procedure: Left Heart Cath and Cors/Grafts Angiography;  Surgeon: Leonie Man, MD;  Location: [facility];  Service: Cardiovascular;  Laterality: N/A;  . LUMBAR LAMINECTOMY  1999    Home Medications:  Allergies as of 12/22/2019      Reactions   Meperidine Nausea And Vomiting, Nausea Only   Statins Other (See Comments)   Severe muscle pain   Codeine Nausea And Vomiting   Hydralazine    neuropathy    Nalbuphine Nausea And Vomiting   Spironolactone    MUSCLE PAIN       Medication List       Accurate as of December 22, 2019 10:11 AM. If you have any questions, ask your nurse or doctor.        acetaminophen 325 MG tablet Commonly known as: TYLENOL Take 650 mg by mouth every 6 (six) hours as needed for moderate pain.   amLODipine 10 MG tablet Commonly known as: NORVASC TAKE 1 TABLET BY MOUTH EVERY DAY   aspirin 81 MG EC tablet Take 81 mg by mouth at bedtime.     Bempedoic Acid 180 MG Tabs Take by mouth.   cholecalciferol 25 MCG (1000 UNIT) tablet Commonly known as: VITAMIN D3 Take 1,000 Units by mouth daily.   Clocortolone Pivalate 0.1 % cream Commonly known as: Cloderm Apply 1 application topically 2 (two) times daily.   finasteride 5 MG tablet Commonly known as: PROSCAR Take 1 tablet (5 mg total) by mouth every evening.   furosemide 20 MG tablet Commonly known as: LASIX   Investigational - Study Medication Take 1 tablet by mouth every morning. Study name:Bempedoic Acid 180mg  Additional study details:Cholesterol medication   isosorbide mononitrate 30 MG 24 hr tablet Commonly known as: IMDUR TAKE 1/2 A TABLET BY MOUTH DAILY   neomycin-polymyxin b-dexamethasone 3.5-10000-0.1 Oint Commonly known as: MAXITROL   omeprazole 20 MG capsule Commonly known as: PRILOSEC Take 20 mg by mouth 2 (two) times daily. Before LUNCH and Before SUPPER   PROBIOTIC DAILY PO Take 1 capsule by mouth daily.   psyllium 58.6 % powder Commonly known as: METAMUCIL Take 1 packet by mouth daily.   tamsulosin 0.4 MG Caps capsule Commonly known as: FLOMAX Take 0.4 mg by mouth at bedtime.   traMADol 50 MG tablet Commonly known as: ULTRAM Take 50 mg by mouth every 6 (six) hours as needed.   zolpidem 10 MG tablet Commonly known as: AMBIEN Take 5 tablets by mouth at bedtime.       Allergies:  Allergies  Allergen Reactions  . Meperidine Nausea And Vomiting and Nausea Only  . Statins Other (See Comments)  Severe muscle pain  . Codeine Nausea And Vomiting  . Hydralazine     neuropathy   . Nalbuphine Nausea And Vomiting  . Spironolactone     MUSCLE PAIN     Family History  Problem Relation Age of Onset  . Coronary artery disease Father        also grandfather  . Colon cancer Neg Hx     Social History:  reports that he has never smoked. He has never used smokeless tobacco. He reports previous alcohol use of about 2.0 standard drinks of  alcohol per week. He reports that he does not use drugs.  ROS: A complete review of systems was performed.  All systems are negative except for pertinent findings as noted.  Physical Exam:  Vital signs in last 24 hours: BP (!) 143/73   Pulse 74   Temp 97.8 F (36.6 C)   Wt 168 lb (76.2 kg)   BMI 24.11 kg/m  Constitutional:  Alert and oriented, No acute distress Cardiovascular: Regular rate  Respiratory: Normal respiratory effort GI: Abdomen is soft, nontender, nondistended, no abdominal masses. No CVAT. No hernias Genitourinary: Normal male phallus, testes are descended bilaterally and non-tender and without masses, scrotum is normal in appearance without lesions or masses, perineum is normal on inspection. Prostate feels about 60 grams in size.  Neurologic: Grossly intact, no focal deficits Psychiatric: Normal mood and affect  I have reviewed prior pt notes  I have reviewed notes from referring/previous physicians  I have reviewed urinalysis results  I have reviewed prior PSA results    Impression/Assessment:  BPH, on finasteride w/ minimal symptomatology. Nocturia may ne d/t caffeine use/sleep issues  PSA, - TRUS/Bx 2014 w/ fairly stable PSA on finasteride  Plan:  1. Pt advised to reduce his sodium intake to reduce peripheral edema and nocturia. Pt advised to reduce his caffeine intake to aid with his sleep.  2. Pt continued on finasteride.  3. F/U in 6 months for OV, PSA, and symptom recheck  CC: Dr. Hilma Favors

## 2019-12-22 NOTE — Progress Notes (Signed)
Urological Symptom Review  Patient is experiencing the following symptoms: Frequent urination Get up at night to urinate   Review of Systems  Gastrointestinal (upper)  : Negative for upper GI symptoms  Gastrointestinal (lower) : Negative for lower GI symptoms  Constitutional : Negative for symptoms  Skin: Negative for skin symptoms  Eyes: Negative for eye symptoms  Ear/Nose/Throat : Negative for Ear/Nose/Throat symptoms  Hematologic/Lymphatic: Negative for Hematologic/Lymphatic symptoms  Cardiovascular : Leg swelling  Respiratory : Negative for respiratory symptoms  Endocrine: Negative for endocrine symptoms  Musculoskeletal: Back pain  Neurological: Dizziness  Psychologic: Negative for psychiatric symptoms

## 2019-12-31 DIAGNOSIS — M1711 Unilateral primary osteoarthritis, right knee: Secondary | ICD-10-CM | POA: Diagnosis not present

## 2020-01-12 ENCOUNTER — Other Ambulatory Visit: Payer: Self-pay

## 2020-01-12 ENCOUNTER — Ambulatory Visit (INDEPENDENT_AMBULATORY_CARE_PROVIDER_SITE_OTHER): Payer: Medicare Other | Admitting: Physician Assistant

## 2020-01-12 ENCOUNTER — Encounter: Payer: Self-pay | Admitting: Physician Assistant

## 2020-01-12 DIAGNOSIS — Z86018 Personal history of other benign neoplasm: Secondary | ICD-10-CM

## 2020-01-12 DIAGNOSIS — Z1283 Encounter for screening for malignant neoplasm of skin: Secondary | ICD-10-CM

## 2020-01-12 DIAGNOSIS — L570 Actinic keratosis: Secondary | ICD-10-CM

## 2020-01-12 DIAGNOSIS — Z85828 Personal history of other malignant neoplasm of skin: Secondary | ICD-10-CM | POA: Diagnosis not present

## 2020-01-12 DIAGNOSIS — I2510 Atherosclerotic heart disease of native coronary artery without angina pectoris: Secondary | ICD-10-CM | POA: Diagnosis not present

## 2020-01-12 DIAGNOSIS — C4441 Basal cell carcinoma of skin of scalp and neck: Secondary | ICD-10-CM | POA: Diagnosis not present

## 2020-01-12 DIAGNOSIS — C4491 Basal cell carcinoma of skin, unspecified: Secondary | ICD-10-CM

## 2020-01-12 HISTORY — DX: Basal cell carcinoma of skin, unspecified: C44.91

## 2020-01-12 NOTE — Progress Notes (Addendum)
   Follow-Up Visit   Subjective  [name] is a 74 y.o. male who presents for the following: Follow-up (f/u LN2).   The following portions of the chart were reviewed this encounter and updated as appropriate: Tobacco  Allergies  Meds  Problems  Med Hx  Surg Hx  Fam Hx      Objective  Well appearing patient in no apparent distress; mood and affect are within normal limits.  A focused examination was performed including waist up and legs. Relevant physical exam findings are noted in the Assessment and Plan.  Objective  Mid Tip of Nose, Neck - Posterior, Right Popliteal Fossa: Erythematous patches with gritty scale.  Objective  Waist up and legs: No atypical nevi   Objective  Left Hand - Posterior: All scars clear  Objective  Right Buccal Cheek , Right elbow, Right nare: All scars clear  Objective  Left Upper Arm - Anterior, Right Chin: All scars clear  Objective  Neck - Posterior: Pearly papule with telangectasia.      Assessment & Plan  AK (actinic keratosis) (3) Right Popliteal Fossa; Neck - Posterior; Mid Tip of Nose  Destruction of lesion - Mid Tip of Nose, Neck - Posterior, Right Popliteal Fossa Complexity: simple   Destruction method: cryotherapy   Informed consent: discussed and consent obtained   Timeout:  patient name, date of birth, surgical site, and procedure verified Lesion destroyed using liquid nitrogen: Yes   Cryotherapy cycles:  1 Outcome: patient tolerated procedure well with no complications   Post-procedure details: wound care instructions given    Screening exam for skin cancer Waist up and legs  Biannual skin exams  History of dysplastic nevus Left Hand - Posterior  observe  History of SCC (squamous cell carcinoma) of skin (3) Right elbow; Right nare; Right Buccal Cheek   observe  History of basal cell carcinoma (BCC) (2) Left Upper Arm - Anterior; Right Chin  Basal cell carcinoma (BCC) of skin of neck Neck -  Posterior  Epidermal / dermal shaving  Lesion diameter (cm):  1 Informed consent: discussed and consent obtained   Timeout: patient name, date of birth, surgical site, and procedure verified   Procedure prep:  Patient was prepped and draped in usual sterile fashion Prep type:  Chlorhexidine Anesthesia: the lesion was anesthetized in a standard fashion   Anesthetic:  1% lidocaine w/ epinephrine 1-100,000 local infiltration Instrument used: DermaBlade   Hemostasis achieved with: aluminum chloride   Outcome: patient tolerated procedure well   Post-procedure details: sterile dressing applied and wound care instructions given   Dressing type: petrolatum gauze, petrolatum and bandage    Specimen 1 - Surgical pathology Differential Diagnosis: bcc Check Margins: yes   I, Dyna Figuereo, PA-C, have reviewed all documentation's for this visit.  The documentation on 03/07/20 for the exam, diagnosis, procedures and orders are all accurate and complete.

## 2020-01-14 DIAGNOSIS — N183 Chronic kidney disease, stage 3 unspecified: Secondary | ICD-10-CM | POA: Diagnosis not present

## 2020-01-14 DIAGNOSIS — I129 Hypertensive chronic kidney disease with stage 1 through stage 4 chronic kidney disease, or unspecified chronic kidney disease: Secondary | ICD-10-CM | POA: Diagnosis not present

## 2020-01-14 DIAGNOSIS — E7849 Other hyperlipidemia: Secondary | ICD-10-CM | POA: Diagnosis not present

## 2020-02-13 DIAGNOSIS — N183 Chronic kidney disease, stage 3 unspecified: Secondary | ICD-10-CM | POA: Diagnosis not present

## 2020-02-13 DIAGNOSIS — E7849 Other hyperlipidemia: Secondary | ICD-10-CM | POA: Diagnosis not present

## 2020-02-13 DIAGNOSIS — I129 Hypertensive chronic kidney disease with stage 1 through stage 4 chronic kidney disease, or unspecified chronic kidney disease: Secondary | ICD-10-CM | POA: Diagnosis not present

## 2020-02-16 ENCOUNTER — Telehealth: Payer: Self-pay

## 2020-02-16 NOTE — Telephone Encounter (Signed)
Phone call to patient with his pathology results. Voicemail left for patient to give the office a call back.  ?

## 2020-02-16 NOTE — Telephone Encounter (Signed)
-----   Message from Lavonna Monarch, MD sent at 02/15/2020 12:50 PM EDT ----- If the base was not treated at the time the biopsy, and skin can schedule 30 minutes with [facility] or me

## 2020-02-17 NOTE — Telephone Encounter (Signed)
Pathology to patient; recheck in 3 months

## 2020-02-19 DIAGNOSIS — M1711 Unilateral primary osteoarthritis, right knee: Secondary | ICD-10-CM | POA: Diagnosis not present

## 2020-02-22 DIAGNOSIS — I8312 Varicose veins of left lower extremity with inflammation: Secondary | ICD-10-CM | POA: Diagnosis not present

## 2020-02-22 DIAGNOSIS — I8311 Varicose veins of right lower extremity with inflammation: Secondary | ICD-10-CM | POA: Diagnosis not present

## 2020-02-22 DIAGNOSIS — I83813 Varicose veins of bilateral lower extremities with pain: Secondary | ICD-10-CM | POA: Diagnosis not present

## 2020-02-22 DIAGNOSIS — I83893 Varicose veins of bilateral lower extremities with other complications: Secondary | ICD-10-CM | POA: Diagnosis not present

## 2020-02-26 DIAGNOSIS — M1711 Unilateral primary osteoarthritis, right knee: Secondary | ICD-10-CM | POA: Diagnosis not present

## 2020-03-01 DIAGNOSIS — I8311 Varicose veins of right lower extremity with inflammation: Secondary | ICD-10-CM | POA: Diagnosis not present

## 2020-03-04 DIAGNOSIS — M1711 Unilateral primary osteoarthritis, right knee: Secondary | ICD-10-CM | POA: Diagnosis not present

## 2020-03-08 ENCOUNTER — Telehealth: Payer: Self-pay

## 2020-03-08 DIAGNOSIS — I8311 Varicose veins of right lower extremity with inflammation: Secondary | ICD-10-CM | POA: Diagnosis not present

## 2020-03-08 DIAGNOSIS — I83891 Varicose veins of right lower extremities with other complications: Secondary | ICD-10-CM | POA: Diagnosis not present

## 2020-03-08 DIAGNOSIS — I83811 Varicose veins of right lower extremities with pain: Secondary | ICD-10-CM | POA: Diagnosis not present

## 2020-03-08 NOTE — Telephone Encounter (Signed)
Phone call to [facility] to have them check the margins per [employer], PA-C.  Per [facility] rep she will send the request over to have the margins check and then they will fax [place] the addendum.

## 2020-03-08 NOTE — Telephone Encounter (Signed)
-----   Message from Warren Danes, [place] sent at 03/08/2020 10:45 AM EST ----- Need pathology margins for posterior neck lesion from [facility]... addendum.

## 2020-03-09 DIAGNOSIS — I83811 Varicose veins of right lower extremities with pain: Secondary | ICD-10-CM | POA: Diagnosis not present

## 2020-03-09 DIAGNOSIS — I8311 Varicose veins of right lower extremity with inflammation: Secondary | ICD-10-CM | POA: Diagnosis not present

## 2020-03-09 DIAGNOSIS — I83891 Varicose veins of right lower extremities with other complications: Secondary | ICD-10-CM | POA: Diagnosis not present

## 2020-03-14 DIAGNOSIS — N1831 Chronic kidney disease, stage 3a: Secondary | ICD-10-CM | POA: Diagnosis not present

## 2020-03-14 DIAGNOSIS — I129 Hypertensive chronic kidney disease with stage 1 through stage 4 chronic kidney disease, or unspecified chronic kidney disease: Secondary | ICD-10-CM | POA: Diagnosis not present

## 2020-03-14 DIAGNOSIS — E559 Vitamin D deficiency, unspecified: Secondary | ICD-10-CM | POA: Diagnosis not present

## 2020-03-14 NOTE — Telephone Encounter (Signed)
Verbal order given by Robyne Askew, PA-C to contact patient and schedule him for surgery on his neck.  Phone call to patient to schedule surgery on his neck.  Appointment scheduled.

## 2020-03-14 NOTE — Telephone Encounter (Signed)
-----   Message from Warren Danes, [place] sent at 03/08/2020 10:45 AM EST ----- Need pathology margins for posterior neck lesion from [facility]... addendum.

## 2020-03-15 DIAGNOSIS — I8311 Varicose veins of right lower extremity with inflammation: Secondary | ICD-10-CM | POA: Diagnosis not present

## 2020-03-17 DIAGNOSIS — E559 Vitamin D deficiency, unspecified: Secondary | ICD-10-CM | POA: Diagnosis not present

## 2020-03-17 DIAGNOSIS — R600 Localized edema: Secondary | ICD-10-CM | POA: Diagnosis not present

## 2020-03-17 DIAGNOSIS — I129 Hypertensive chronic kidney disease with stage 1 through stage 4 chronic kidney disease, or unspecified chronic kidney disease: Secondary | ICD-10-CM | POA: Diagnosis not present

## 2020-03-17 DIAGNOSIS — N1831 Chronic kidney disease, stage 3a: Secondary | ICD-10-CM | POA: Diagnosis not present

## 2020-03-28 DIAGNOSIS — E7849 Other hyperlipidemia: Secondary | ICD-10-CM | POA: Diagnosis not present

## 2020-03-28 DIAGNOSIS — Z1389 Encounter for screening for other disorder: Secondary | ICD-10-CM | POA: Diagnosis not present

## 2020-03-28 DIAGNOSIS — N3281 Overactive bladder: Secondary | ICD-10-CM | POA: Diagnosis not present

## 2020-03-28 DIAGNOSIS — I2510 Atherosclerotic heart disease of native coronary artery without angina pectoris: Secondary | ICD-10-CM | POA: Diagnosis not present

## 2020-03-28 DIAGNOSIS — I10 Essential (primary) hypertension: Secondary | ICD-10-CM | POA: Diagnosis not present

## 2020-03-28 DIAGNOSIS — Z888 Allergy status to other drugs, medicaments and biological substances status: Secondary | ICD-10-CM | POA: Diagnosis not present

## 2020-03-28 DIAGNOSIS — Z6826 Body mass index (BMI) 26.0-26.9, adult: Secondary | ICD-10-CM | POA: Diagnosis not present

## 2020-03-28 DIAGNOSIS — M791 Myalgia, unspecified site: Secondary | ICD-10-CM | POA: Diagnosis not present

## 2020-03-28 DIAGNOSIS — Z0001 Encounter for general adult medical examination with abnormal findings: Secondary | ICD-10-CM | POA: Diagnosis not present

## 2020-03-28 DIAGNOSIS — Z1331 Encounter for screening for depression: Secondary | ICD-10-CM | POA: Diagnosis not present

## 2020-03-28 DIAGNOSIS — R7309 Other abnormal glucose: Secondary | ICD-10-CM | POA: Diagnosis not present

## 2020-03-31 DIAGNOSIS — I8311 Varicose veins of right lower extremity with inflammation: Secondary | ICD-10-CM | POA: Diagnosis not present

## 2020-04-07 DIAGNOSIS — I83891 Varicose veins of right lower extremities with other complications: Secondary | ICD-10-CM | POA: Diagnosis not present

## 2020-04-07 DIAGNOSIS — I83811 Varicose veins of right lower extremities with pain: Secondary | ICD-10-CM | POA: Diagnosis not present

## 2020-04-07 DIAGNOSIS — I8311 Varicose veins of right lower extremity with inflammation: Secondary | ICD-10-CM | POA: Diagnosis not present

## 2020-04-14 DIAGNOSIS — M7981 Nontraumatic hematoma of soft tissue: Secondary | ICD-10-CM | POA: Diagnosis not present

## 2020-04-14 DIAGNOSIS — I8311 Varicose veins of right lower extremity with inflammation: Secondary | ICD-10-CM | POA: Diagnosis not present

## 2020-04-21 ENCOUNTER — Other Ambulatory Visit: Payer: Self-pay | Admitting: Urology

## 2020-04-21 DIAGNOSIS — R972 Elevated prostate specific antigen [PSA]: Secondary | ICD-10-CM

## 2020-04-28 DIAGNOSIS — M7981 Nontraumatic hematoma of soft tissue: Secondary | ICD-10-CM | POA: Diagnosis not present

## 2020-04-28 DIAGNOSIS — I8311 Varicose veins of right lower extremity with inflammation: Secondary | ICD-10-CM | POA: Diagnosis not present

## 2020-05-11 ENCOUNTER — Other Ambulatory Visit: Payer: Self-pay | Admitting: Cardiology

## 2020-05-11 ENCOUNTER — Telehealth: Payer: Self-pay | Admitting: Cardiology

## 2020-05-11 MED ORDER — AMLODIPINE BESYLATE 10 MG PO TABS: 10.0000 mg | ORAL_TABLET | Freq: Every day | ORAL | 1 refills | Status: DC

## 2020-05-11 NOTE — Telephone Encounter (Signed)
Medication sent to pharmacy - has appt with Dr Harl Bowie in March

## 2020-05-11 NOTE — Telephone Encounter (Signed)
New message   Pt due for yearly check up in March ? Was only give 15 pills    *STAT* If patient is at the pharmacy, call can be transferred to refill team.   1. Which medications need to be refilled? (please list name of each medication and dose if known)amLODipine (NORVASC) 10 MG tablet  2. Which pharmacy/location (including street and city if local pharmacy) is medication to be sent to? [facility] in [place]   3. Do they need a 30 day or 90 day supply? [place]

## 2020-05-14 DIAGNOSIS — N183 Chronic kidney disease, stage 3 unspecified: Secondary | ICD-10-CM | POA: Diagnosis not present

## 2020-05-14 DIAGNOSIS — E7849 Other hyperlipidemia: Secondary | ICD-10-CM | POA: Diagnosis not present

## 2020-05-14 DIAGNOSIS — I129 Hypertensive chronic kidney disease with stage 1 through stage 4 chronic kidney disease, or unspecified chronic kidney disease: Secondary | ICD-10-CM | POA: Diagnosis not present

## 2020-05-18 ENCOUNTER — Other Ambulatory Visit: Payer: Self-pay | Admitting: Cardiology

## 2020-06-07 DIAGNOSIS — M9903 Segmental and somatic dysfunction of lumbar region: Secondary | ICD-10-CM | POA: Diagnosis not present

## 2020-06-07 DIAGNOSIS — M9901 Segmental and somatic dysfunction of cervical region: Secondary | ICD-10-CM | POA: Diagnosis not present

## 2020-06-07 DIAGNOSIS — M9902 Segmental and somatic dysfunction of thoracic region: Secondary | ICD-10-CM | POA: Diagnosis not present

## 2020-06-07 DIAGNOSIS — M47816 Spondylosis without myelopathy or radiculopathy, lumbar region: Secondary | ICD-10-CM | POA: Diagnosis not present

## 2020-06-07 DIAGNOSIS — M47812 Spondylosis without myelopathy or radiculopathy, cervical region: Secondary | ICD-10-CM | POA: Diagnosis not present

## 2020-06-07 DIAGNOSIS — M546 Pain in thoracic spine: Secondary | ICD-10-CM | POA: Diagnosis not present

## 2020-06-08 DIAGNOSIS — M9901 Segmental and somatic dysfunction of cervical region: Secondary | ICD-10-CM | POA: Diagnosis not present

## 2020-06-08 DIAGNOSIS — M9902 Segmental and somatic dysfunction of thoracic region: Secondary | ICD-10-CM | POA: Diagnosis not present

## 2020-06-08 DIAGNOSIS — M546 Pain in thoracic spine: Secondary | ICD-10-CM | POA: Diagnosis not present

## 2020-06-08 DIAGNOSIS — M9903 Segmental and somatic dysfunction of lumbar region: Secondary | ICD-10-CM | POA: Diagnosis not present

## 2020-06-08 DIAGNOSIS — M47812 Spondylosis without myelopathy or radiculopathy, cervical region: Secondary | ICD-10-CM | POA: Diagnosis not present

## 2020-06-08 DIAGNOSIS — M47816 Spondylosis without myelopathy or radiculopathy, lumbar region: Secondary | ICD-10-CM | POA: Diagnosis not present

## 2020-06-10 DIAGNOSIS — I129 Hypertensive chronic kidney disease with stage 1 through stage 4 chronic kidney disease, or unspecified chronic kidney disease: Secondary | ICD-10-CM | POA: Diagnosis not present

## 2020-06-10 DIAGNOSIS — N1831 Chronic kidney disease, stage 3a: Secondary | ICD-10-CM | POA: Diagnosis not present

## 2020-06-10 DIAGNOSIS — E559 Vitamin D deficiency, unspecified: Secondary | ICD-10-CM | POA: Diagnosis not present

## 2020-06-10 DIAGNOSIS — R7309 Other abnormal glucose: Secondary | ICD-10-CM | POA: Diagnosis not present

## 2020-06-14 ENCOUNTER — Other Ambulatory Visit: Payer: Self-pay

## 2020-06-14 ENCOUNTER — Other Ambulatory Visit: Payer: Medicare Other

## 2020-06-14 DIAGNOSIS — R972 Elevated prostate specific antigen [PSA]: Secondary | ICD-10-CM | POA: Diagnosis not present

## 2020-06-15 ENCOUNTER — Other Ambulatory Visit: Payer: Self-pay | Admitting: Urology

## 2020-06-15 DIAGNOSIS — R972 Elevated prostate specific antigen [PSA]: Secondary | ICD-10-CM

## 2020-06-15 LAB — PSA: Prostate Specific Ag, Serum: 5.9 ng/mL — ABNORMAL HIGH (ref 0.0–4.0)

## 2020-06-17 DIAGNOSIS — N1832 Chronic kidney disease, stage 3b: Secondary | ICD-10-CM | POA: Diagnosis not present

## 2020-06-17 DIAGNOSIS — I129 Hypertensive chronic kidney disease with stage 1 through stage 4 chronic kidney disease, or unspecified chronic kidney disease: Secondary | ICD-10-CM | POA: Diagnosis not present

## 2020-06-17 DIAGNOSIS — E559 Vitamin D deficiency, unspecified: Secondary | ICD-10-CM | POA: Diagnosis not present

## 2020-06-20 NOTE — Progress Notes (Signed)
History of Present Illness: Here for f/u of BPH and elevated PSA. He had a negative biopsy in Aug 2014. At that time, prostatic volume was 90 mL. He is on finasteride. Since started, PSA has appropriately decreased.    6.2.2020: PSA 2.7 (corrected 5.4)    3.2.2021: PSA 4.8--corrected 9.6     3.8.2022: PSA 5.9 (corrected 11.8) IPSS 4   QoL score 2.  He has had no significant urinary symptoms recently.  Past Medical History:  Diagnosis Date  . Arteriosclerotic cardiovascular disease (ASCVD)    CABG in 08/1998  (LIMA-mLAD, SVG-rPDA, SVG-OM3-with Y SVG-lateral OM3.)  . Atypical nevus 11/29/2005   slight atypia - left hand  . Basal cell carcinoma of skin 03/22/2004   Left scapula, superior - CX3 + 5FU  . Basal cell carcinoma of skin 03/22/2004   Left scapula, inferior - CX3 + excision  . Basal cell carcinoma of skin 03/22/2004   Center mid back - CX3 + 5FU  . Basal cell carcinoma of skin 07/24/2004   Left shin - CX3 + 5FU  . Basal cell carcinoma of skin 11/29/2005   Left shoulder - CX3 + 5FU  . Basal cell carcinoma of skin 10/22/2006   ulcerated on right outer knee - CX3+5FU  . Basal cell carcinoma of skin 12/02/2008   left upper arm, superior - CX3+5FU  . Basal cell carcinoma of skin 05/16/2011   right shoulder  . Basal cell carcinoma of skin 05/16/2011   ulcerated above left sideburn  . Basal cell carcinoma of skin 05/16/2011   lateral left arm  . Basal cell carcinoma of skin 06/29/2015   nodular on left ear - tx p bx  . Basal cell carcinoma of skin 06/08/2016   superficial on left scapha, ear [facility])  . Basal cell carcinoma of skin 07/25/2017   nodular on left ear (MOHS)  . Basal cell carcinoma of skin 07/25/2017   nodular on left thigh - tx p bx  . Basal cell carcinoma of skin 11/18/2018   nodular on right nare - tx p bx  . BCC (basal cell carcinoma of skin) 08/25/2019   left upper arm anterior (CX35FU)  . BCC (basal cell carcinoma of skin) 08/25/2019   right chin  . BPH  (benign prostatic hypertrophy)    h/x of prostatis/ UTI in 2006  . Chronic kidney disease, stage II (mild) 2010   Creatinine of 1.5 in 2010  . GERD (gastroesophageal reflux disease)    s/p espohageal dilatatin and repair of hiatal hernia  . Hyperlipidemia   . Hypertension   . IBS (irritable bowel syndrome)   . 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   Neck-posterior  . PONV (postoperative nausea and vomiting)   . 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   right buccal cheek (CX35FU)  . 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   right elbow(CX35FU)  . 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   Right Forearm Upper(well diff) (tx p bx)  . 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   Right Index Finger(in situ) (tx p bx)  . 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   Left Forearm,upper(in situ) (tx p bx)  . 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/18/2018   Base of Left Middle Finger(in situ) (tx p bx)  . 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/18/2018   Right Outer Forearm,sup(in situ) (tx p bx)  . 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/18/2018   Right Forearm,Inf(in situ) (tx p bx)  .  Sigmoid diverticulosis   . 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   in situ on right hand - CX3 + 5FU  . 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   in situ on right ear - CX3 + 5FU  . Squamous cell carcinoma of skin 01/21/2006   in situ on upper mid back - tx p bx  . Squamous cell carcinoma of skin 01/21/2006   in situ on top left shoulder - tx p bx  . Squamous cell carcinoma of skin 10/22/2006   KA under left eye - MOHs  . Squamous cell carcinoma of skin 12/02/2008   in situ on left base of 4th finger - CX3+5FU  . Squamous cell carcinoma of skin 08/30/2009   well differentiated on right jawline  . Squamous cell carcinoma of skin 08/30/2009   in situ on left upper arm - CX3+imiquimod  . Squamous cell carcinoma of skin 02/25/2014   in situ on left  temple  . Squamous cell carcinoma of skin 02/25/2014   in situ on left arm  . Squamous cell carcinoma of skin 06/08/2016   in situ on left lower side neck - tx p bx  . Squamous cell carcinoma of skin 06/08/2016   left shin - tx p bx  . Squamous cell carcinoma of skin 06/08/2016   in situ on right temple - tx p bx  . Squamous cell carcinoma of skin 06/08/2016   in situ on right jawline - tx p bx    Past Surgical History:  Procedure Laterality Date  . BREAST BIOPSY Right 08/17/2013   Procedure: RIGHT BREAST BIOPSY;  Surgeon: [name], MD;  Location: [facility];  Service: General;  Laterality: Right;  . CHOLECYSTECTOMY  1990s  . COLONOSCOPY  03/17/2012   Rourk; colonic diverticulosis, repeat screening colonoscopy in 10 years.  . CORONARY ARTERY BYPASS GRAFT  2000  . ESOPHAGOGASTRODUODENOSCOPY  May 2009   Rourk: Food impaction, critical esophageal ring/stricture requiring dilation  . HEMORRHOID SURGERY  2007   total of three  . HIATAL HERNIA REPAIR  1995  . LEFT HEART CATH AND CORS/GRAFTS ANGIOGRAPHY N/A 08/23/2016   Procedure: Left Heart Cath and Cors/Grafts Angiography;  Surgeon: Leonie Man, MD;  Location: [place] [facility];  Service: Cardiovascular;  Laterality: N/A;  . LUMBAR LAMINECTOMY  1999    Home Medications:  Allergies as of 06/21/2020       Reactions   Meperidine Nausea And Vomiting, Nausea Only   Statins Other (See Comments)   Severe muscle pain   Codeine Nausea And Vomiting   Hydralazine    neuropathy    Nalbuphine Nausea And Vomiting   Spironolactone    MUSCLE PAIN         Medication List        Accurate as of June 20, 2020  7:59 PM. If you have any questions, ask your nurse or doctor.          acetaminophen 325 MG tablet Commonly known as: TYLENOL Take 650 mg by mouth every 6 (six) hours as needed for moderate pain.   amLODipine 10 MG tablet Commonly known as: NORVASC Take 1 tablet (10 mg total) by mouth daily.   aspirin 81 MG EC  tablet Take 81 mg by mouth at bedtime.   Bempedoic Acid 180 MG Tabs Take by mouth.   cholecalciferol 25 MCG (1000 UNIT) tablet Commonly known as: VITAMIN D3 Take 1,000 Units by mouth daily.   Clocortolone Pivalate 0.1 %  cream Commonly known as: Cloderm Apply 1 application topically 2 (two) times daily.   finasteride 5 MG tablet Commonly known as: PROSCAR Take 1 tablet (5 mg total) by mouth every evening.   furosemide 20 MG tablet Commonly known as: LASIX   Investigational - Study Medication Take 1 tablet by mouth every morning. Study name:Bempedoic Acid 180mg  Additional study details:Cholesterol medication   isosorbide mononitrate 30 MG 24 hr tablet Commonly known as: IMDUR TAKE 1/2 A TABLET BY MOUTH DAILY   neomycin-polymyxin b-dexamethasone 3.5-10000-0.1 Oint Commonly known as: MAXITROL   omeprazole 20 MG capsule Commonly known as: PRILOSEC Take 20 mg by mouth 2 (two) times daily. Before LUNCH and Before SUPPER   PROBIOTIC DAILY PO Take 1 capsule by mouth daily.   psyllium 58.6 % powder Commonly known as: METAMUCIL Take 1 packet by mouth daily.   tamsulosin 0.4 MG Caps capsule Commonly known as: FLOMAX Take 0.4 mg by mouth at bedtime.   traMADol 50 MG tablet Commonly known as: ULTRAM Take 50 mg by mouth every 6 (six) hours as needed.   zolpidem 10 MG tablet Commonly known as: AMBIEN Take 5 tablets by mouth at bedtime.        Allergies:  Allergies  Allergen Reactions  . Meperidine Nausea And Vomiting and Nausea Only  . Statins Other (See Comments)    Severe muscle pain  . Codeine Nausea And Vomiting  . Hydralazine     neuropathy   . Nalbuphine Nausea And Vomiting  . Spironolactone     MUSCLE PAIN     Family History  Problem Relation Age of Onset  . Coronary artery disease Father        also grandfather  . Colon cancer Neg Hx     Social History:  reports that he has never smoked. He has never used smokeless tobacco. He reports previous  alcohol use of about 2.0 standard drinks of alcohol per week. He reports that he does not use drugs.  ROS: A complete review of systems was performed.  All systems are negative except for pertinent findings as noted.  Physical Exam:  Vital signs in last 24 hours: There were no vitals taken for this visit. Constitutional:  Alert and oriented, No acute distress Cardiovascular: Regular rate  Respiratory: Normal respiratory effort GI: No inguinal hernias Genitourinary: Normal male phallus, testes are descended bilaterally and non-tender and without masses, scrotum is normal in appearance without lesions or masses, perineum is normal on inspection.  State 80 g, symmetrical, nonnodular, nontender. Lymphatic: No lymphadenopathy Neurologic: Grossly intact, no focal deficits Psychiatric: Normal mood and affect  I have reviewed prior pt notes  I have reviewed notes from referring/previous physicians  I have reviewed urinalysis results  I have reviewed prior PSA results   Impression/Assessment:  1.  History of elevated PSA with negative biopsy in 2014  2.  BPH, large gland with stable symptoms.    Plan:  1.  I will recheck his PSA in a couple of months  2.  If PSA still elevated consider biopsy with or without MRI beforehand.

## 2020-06-21 ENCOUNTER — Other Ambulatory Visit: Payer: Self-pay

## 2020-06-21 ENCOUNTER — Ambulatory Visit (INDEPENDENT_AMBULATORY_CARE_PROVIDER_SITE_OTHER): Payer: Medicare Other | Admitting: Urology

## 2020-06-21 ENCOUNTER — Encounter: Payer: Self-pay | Admitting: Urology

## 2020-06-21 VITALS — BP 158/69 | HR 80 | Temp 97.7°F | Wt 172.0 lb

## 2020-06-21 DIAGNOSIS — R350 Frequency of micturition: Secondary | ICD-10-CM | POA: Diagnosis not present

## 2020-06-21 DIAGNOSIS — R972 Elevated prostate specific antigen [PSA]: Secondary | ICD-10-CM

## 2020-06-21 DIAGNOSIS — N401 Enlarged prostate with lower urinary tract symptoms: Secondary | ICD-10-CM

## 2020-06-21 LAB — URINALYSIS, ROUTINE W REFLEX MICROSCOPIC
Bilirubin, UA: NEGATIVE
Glucose, UA: NEGATIVE
Ketones, UA: NEGATIVE
Leukocytes,UA: NEGATIVE
Nitrite, UA: NEGATIVE
Protein,UA: NEGATIVE
RBC, UA: NEGATIVE
Specific Gravity, UA: 1.01 (ref 1.005–1.030)
Urobilinogen, Ur: 0.2 mg/dL (ref 0.2–1.0)
pH, UA: 5.5 (ref 5.0–7.5)

## 2020-06-21 NOTE — Progress Notes (Signed)
Urological Symptom Review  Patient is experiencing the following symptoms: Frequent urination Get up at night to urinate   Review of Systems  Gastrointestinal (upper)  : Negative for upper GI symptoms  Gastrointestinal (lower) : Negative for lower GI symptoms  Constitutional : Negative for symptoms  Skin: Itching  Eyes: Negative for eye symptoms  Ear/Nose/Throat : Sinus problems  Hematologic/Lymphatic: Negative for Hematologic/Lymphatic symptoms  Cardiovascular : Negative for cardiovascular symptoms  Respiratory : Negative for respiratory symptoms  Endocrine: Negative for endocrine symptoms  Musculoskeletal: Back pain  Neurological: Negative for neurological symptoms  Psychologic negative

## 2020-06-28 DIAGNOSIS — M9903 Segmental and somatic dysfunction of lumbar region: Secondary | ICD-10-CM | POA: Diagnosis not present

## 2020-06-28 DIAGNOSIS — M9901 Segmental and somatic dysfunction of cervical region: Secondary | ICD-10-CM | POA: Diagnosis not present

## 2020-06-28 DIAGNOSIS — M9902 Segmental and somatic dysfunction of thoracic region: Secondary | ICD-10-CM | POA: Diagnosis not present

## 2020-06-28 DIAGNOSIS — M546 Pain in thoracic spine: Secondary | ICD-10-CM | POA: Diagnosis not present

## 2020-06-28 DIAGNOSIS — M47812 Spondylosis without myelopathy or radiculopathy, cervical region: Secondary | ICD-10-CM | POA: Diagnosis not present

## 2020-06-28 DIAGNOSIS — M47816 Spondylosis without myelopathy or radiculopathy, lumbar region: Secondary | ICD-10-CM | POA: Diagnosis not present

## 2020-06-30 DIAGNOSIS — M9901 Segmental and somatic dysfunction of cervical region: Secondary | ICD-10-CM | POA: Diagnosis not present

## 2020-06-30 DIAGNOSIS — M47812 Spondylosis without myelopathy or radiculopathy, cervical region: Secondary | ICD-10-CM | POA: Diagnosis not present

## 2020-06-30 DIAGNOSIS — M546 Pain in thoracic spine: Secondary | ICD-10-CM | POA: Diagnosis not present

## 2020-06-30 DIAGNOSIS — M47816 Spondylosis without myelopathy or radiculopathy, lumbar region: Secondary | ICD-10-CM | POA: Diagnosis not present

## 2020-06-30 DIAGNOSIS — M9903 Segmental and somatic dysfunction of lumbar region: Secondary | ICD-10-CM | POA: Diagnosis not present

## 2020-06-30 DIAGNOSIS — M9902 Segmental and somatic dysfunction of thoracic region: Secondary | ICD-10-CM | POA: Diagnosis not present

## 2020-07-05 DIAGNOSIS — M9903 Segmental and somatic dysfunction of lumbar region: Secondary | ICD-10-CM | POA: Diagnosis not present

## 2020-07-05 DIAGNOSIS — M47812 Spondylosis without myelopathy or radiculopathy, cervical region: Secondary | ICD-10-CM | POA: Diagnosis not present

## 2020-07-05 DIAGNOSIS — M546 Pain in thoracic spine: Secondary | ICD-10-CM | POA: Diagnosis not present

## 2020-07-05 DIAGNOSIS — M9902 Segmental and somatic dysfunction of thoracic region: Secondary | ICD-10-CM | POA: Diagnosis not present

## 2020-07-05 DIAGNOSIS — M9901 Segmental and somatic dysfunction of cervical region: Secondary | ICD-10-CM | POA: Diagnosis not present

## 2020-07-05 DIAGNOSIS — M47816 Spondylosis without myelopathy or radiculopathy, lumbar region: Secondary | ICD-10-CM | POA: Diagnosis not present

## 2020-07-07 ENCOUNTER — Encounter: Payer: Self-pay | Admitting: Physician Assistant

## 2020-07-07 ENCOUNTER — Other Ambulatory Visit: Payer: Self-pay

## 2020-07-07 ENCOUNTER — Ambulatory Visit (INDEPENDENT_AMBULATORY_CARE_PROVIDER_SITE_OTHER): Payer: Medicare Other | Admitting: Physician Assistant

## 2020-07-07 DIAGNOSIS — C4441 Basal cell carcinoma of skin of scalp and neck: Secondary | ICD-10-CM

## 2020-07-07 DIAGNOSIS — M9901 Segmental and somatic dysfunction of cervical region: Secondary | ICD-10-CM | POA: Diagnosis not present

## 2020-07-07 DIAGNOSIS — M47816 Spondylosis without myelopathy or radiculopathy, lumbar region: Secondary | ICD-10-CM | POA: Diagnosis not present

## 2020-07-07 DIAGNOSIS — M9902 Segmental and somatic dysfunction of thoracic region: Secondary | ICD-10-CM | POA: Diagnosis not present

## 2020-07-07 DIAGNOSIS — C4491 Basal cell carcinoma of skin, unspecified: Secondary | ICD-10-CM

## 2020-07-07 DIAGNOSIS — M546 Pain in thoracic spine: Secondary | ICD-10-CM | POA: Diagnosis not present

## 2020-07-07 DIAGNOSIS — M9903 Segmental and somatic dysfunction of lumbar region: Secondary | ICD-10-CM | POA: Diagnosis not present

## 2020-07-07 DIAGNOSIS — M47812 Spondylosis without myelopathy or radiculopathy, cervical region: Secondary | ICD-10-CM | POA: Diagnosis not present

## 2020-07-07 DIAGNOSIS — L988 Other specified disorders of the skin and subcutaneous tissue: Secondary | ICD-10-CM | POA: Diagnosis not present

## 2020-07-07 NOTE — Progress Notes (Signed)
   Follow-Up Visit   Subjective  [name] is a 75 y.o. male who presents for the following: Procedure (Skin, neck-posterior/BCC, nodular pattern deep margin involved. ).   The following portions of the chart were reviewed this encounter and updated as appropriate:  Allergies  Meds      Objective  Well appearing patient in no apparent distress; mood and affect are within normal limits.  A focused examination was performed including neck. Relevant physical exam findings are noted in the Assessment and Plan.  Objective  Neck - Posterior: White scar   Assessment & Plan  Basal cell carcinoma (BCC), unspecified site Neck - Posterior  Skin excision  Total excision diameter (cm):  3.5 Informed consent: discussed and consent obtained   Timeout: patient name, date of birth, surgical site, and procedure verified   Anesthesia: the lesion was anesthetized in a standard fashion   Anesthetic:  1% lidocaine w/ epinephrine 1-100,000 local infiltration Instrument used: #15 blade   Hemostasis achieved with: pressure and electrodesiccation   Outcome: patient tolerated procedure well with no complications   Post-procedure details: sterile dressing applied and wound care instructions given   Dressing type: bandage, petrolatum and pressure dressing   Additional details:  3-0 vicryl x 3 4-0 Ethilon x 4   Specimen 1 - Surgical pathology Differential Diagnosis: [id number] Inferior margin stained Check Margins: yes    I, Kanae Ignatowski, PA-C, have reviewed all documentation's for this visit.  The documentation on 07/07/20 for the exam, diagnosis, procedures and orders are all accurate and complete.

## 2020-07-11 ENCOUNTER — Encounter: Payer: Self-pay | Admitting: Cardiology

## 2020-07-11 DIAGNOSIS — R7309 Other abnormal glucose: Secondary | ICD-10-CM | POA: Diagnosis not present

## 2020-07-13 ENCOUNTER — Ambulatory Visit (INDEPENDENT_AMBULATORY_CARE_PROVIDER_SITE_OTHER): Payer: Medicare Other | Admitting: Cardiology

## 2020-07-13 ENCOUNTER — Encounter: Payer: Self-pay | Admitting: *Deleted

## 2020-07-13 VITALS — BP 160/80 | HR 72 | Ht 70.0 in | Wt 169.4 lb

## 2020-07-13 DIAGNOSIS — I129 Hypertensive chronic kidney disease with stage 1 through stage 4 chronic kidney disease, or unspecified chronic kidney disease: Secondary | ICD-10-CM | POA: Diagnosis not present

## 2020-07-13 DIAGNOSIS — Z6826 Body mass index (BMI) 26.0-26.9, adult: Secondary | ICD-10-CM | POA: Diagnosis not present

## 2020-07-13 DIAGNOSIS — I10 Essential (primary) hypertension: Secondary | ICD-10-CM

## 2020-07-13 DIAGNOSIS — I2510 Atherosclerotic heart disease of native coronary artery without angina pectoris: Secondary | ICD-10-CM | POA: Diagnosis not present

## 2020-07-13 DIAGNOSIS — M5136 Other intervertebral disc degeneration, lumbar region: Secondary | ICD-10-CM | POA: Diagnosis not present

## 2020-07-13 DIAGNOSIS — E782 Mixed hyperlipidemia: Secondary | ICD-10-CM | POA: Diagnosis not present

## 2020-07-13 DIAGNOSIS — E663 Overweight: Secondary | ICD-10-CM | POA: Diagnosis not present

## 2020-07-13 DIAGNOSIS — Z1331 Encounter for screening for depression: Secondary | ICD-10-CM | POA: Diagnosis not present

## 2020-07-13 DIAGNOSIS — E7849 Other hyperlipidemia: Secondary | ICD-10-CM | POA: Diagnosis not present

## 2020-07-13 DIAGNOSIS — M5410 Radiculopathy, site unspecified: Secondary | ICD-10-CM | POA: Diagnosis not present

## 2020-07-13 DIAGNOSIS — N183 Chronic kidney disease, stage 3 unspecified: Secondary | ICD-10-CM | POA: Diagnosis not present

## 2020-07-13 NOTE — Progress Notes (Signed)
Clinical Summary [name] is a 75 y.o.male seen today for follow up of the following medical problems.   1. CAD - CABG in 2000 - 06/2015 completed echo which showed normal LVEF. Exercise MPI he did 8 min 25 sec with no EKG changes and no ischemia by imaging.  - has not been on ACE-I since prior admission with AKI, had also been on NSAIDs at that time.   - admit 08/2016 with NSTEMI, cath as reported below, no interventional targets, managed medically. Imdur added to medical regimen and lopressor 12.5mg  bid added.     - some recent MSK pain, no recent cardiac pains.   2. HTN - metoprolol stopped due to low HR's in 09/2017 - prior AKI on ACE-I, though heavy NSAID use at the time also -listed hydralazine, aldactone allergies.    - we previously increased norvasc to 10mg  daily. Reports some prior swelling in the past on norvasc but has tolerated reasonablly well since then - reports high sodium intakearound the time of swelling. After about a day the swelling resolved on its own.   - home bp's 120s-140s/70s-80s HRs 60s  3. Hyperlipidemia - leg pains on vytorin. Did not tolerate low dose pravastatin.  - he enrolled in clinical trial for cholesterol medication.Bempedoic acid trial.  - still participating in lipid study.    4. CKD - followed by nephrologyby Dr Theador Hawthorne Last Cr up to 1.65  5. LE edema -imprioved with prn lasix, had recent varicose vein procedure which helped alot  6. Varicose veins - prior intervention Past Medical History:  Diagnosis Date  . Arteriosclerotic cardiovascular disease (ASCVD)    CABG in 08/1998  (LIMA-mLAD, SVG-rPDA, SVG-OM3-with Y SVG-lateral OM3.)  . Atypical nevus 11/29/2005   slight atypia - left hand  . Basal cell carcinoma of skin 03/22/2004   Left scapula, superior - CX3 + 5FU  . Basal cell carcinoma of skin 03/22/2004   Left scapula, inferior - CX3 + excision  . Basal cell carcinoma of skin 03/22/2004    Center mid back - CX3 + 5FU  . Basal cell carcinoma of skin 07/24/2004   Left shin - CX3 + 5FU  . Basal cell carcinoma of skin 11/29/2005   Left shoulder - CX3 + 5FU  . Basal cell carcinoma of skin 10/22/2006   ulcerated on right outer knee - CX3+5FU  . Basal cell carcinoma of skin 12/02/2008   left upper arm, superior - CX3+5FU  . Basal cell carcinoma of skin 05/16/2011   right shoulder  . Basal cell carcinoma of skin 05/16/2011   ulcerated above left sideburn  . Basal cell carcinoma of skin 05/16/2011   lateral left arm  . Basal cell carcinoma of skin 06/29/2015   nodular on left ear - tx p bx  . Basal cell carcinoma of skin 06/08/2016   superficial on left scapha, ear [facility])  . Basal cell carcinoma of skin 07/25/2017   nodular on left ear (MOHS)  . Basal cell carcinoma of skin 07/25/2017   nodular on left thigh - tx p bx  . Basal cell carcinoma of skin 11/18/2018   nodular on right nare - tx p bx  . BCC (basal cell carcinoma of skin) 08/25/2019   left upper arm anterior (CX35FU)  . BCC (basal cell carcinoma of skin) 08/25/2019   right chin  . BPH (benign prostatic hypertrophy)    h/x of prostatis/ UTI in 2006  . Chronic kidney disease, stage II (mild) 2010  Creatinine of 1.5 in 2010  . GERD (gastroesophageal reflux disease)    s/p espohageal dilatatin and repair of hiatal hernia  . Hyperlipidemia   . Hypertension   . IBS (irritable bowel syndrome)   . 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   Neck-posterior  . PONV (postoperative nausea and vomiting)   . 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   right buccal cheek (CX35FU)  . 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   right elbow(CX35FU)  . 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   Right Forearm Upper(well diff) (tx p bx)  . 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   Right Index Finger(in situ) (tx p bx)  . 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   Left Forearm,upper(in situ)  (tx p bx)  . 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/18/2018   Base of Left Middle Finger(in situ) (tx p bx)  . 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/18/2018   Right Outer Forearm,sup(in situ) (tx p bx)  . 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/18/2018   Right Forearm,Inf(in situ) (tx p bx)  . Sigmoid diverticulosis   . 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   in situ on right hand - CX3 + 5FU  . 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   in situ on right ear - CX3 + 5FU  . Squamous cell carcinoma of skin 01/21/2006   in situ on upper mid back - tx p bx  . Squamous cell carcinoma of skin 01/21/2006   in situ on top left shoulder - tx p bx  . Squamous cell carcinoma of skin 10/22/2006   KA under left eye - MOHs  . Squamous cell carcinoma of skin 12/02/2008   in situ on left base of 4th finger - CX3+5FU  . Squamous cell carcinoma of skin 08/30/2009   well differentiated on right jawline  . Squamous cell carcinoma of skin 08/30/2009   in situ on left upper arm - CX3+imiquimod  . Squamous cell carcinoma of skin 02/25/2014   in situ on left temple  . Squamous cell carcinoma of skin 02/25/2014   in situ on left arm  . Squamous cell carcinoma of skin 06/08/2016   in situ on left lower side neck - tx p bx  . Squamous cell carcinoma of skin 06/08/2016   left shin - tx p bx  . Squamous cell carcinoma of skin 06/08/2016   in situ on right temple - tx p bx  . Squamous cell carcinoma of skin 06/08/2016   in situ on right jawline - tx p bx     Allergies  Allergen Reactions  . Meperidine Nausea And Vomiting and Nausea Only  . Meperidine Hcl Nausea Only  . Statins Other (See Comments)    Severe muscle pain  . Codeine Nausea And Vomiting  . Hydralazine     neuropathy   . Nalbuphine Nausea And Vomiting  . Spironolactone     MUSCLE PAIN      Current Outpatient Medications  Medication Sig Dispense Refill  . acetaminophen (TYLENOL) 325 MG tablet Take 650 mg by mouth  every 6 (six) hours as needed for moderate pain.    Marland Kitchen amLODipine (NORVASC) 10 MG tablet Take 1 tablet (10 mg total) by mouth daily. 30 tablet 1  . aspirin 81 MG EC tablet Take 81 mg by mouth at bedtime.    . Bempedoic Acid 180 MG TABS Take by mouth.    . cholecalciferol (VITAMIN D3)  25 MCG (1000 UNIT) tablet Take 1,000 Units by mouth daily.    . finasteride (PROSCAR) 5 MG tablet Take 1 tablet (5 mg total) by mouth every evening. 30 tablet 5  . furosemide (LASIX) 20 MG tablet     . Investigational - Study Medication Take 1 tablet by mouth every morning. Study name:Bempedoic Acid 180mg  Additional study details:Cholesterol medication    . isosorbide mononitrate (IMDUR) 30 MG 24 hr tablet TAKE 1/2 A TABLET BY MOUTH DAILY 45 tablet 2  . omeprazole (PRILOSEC) 20 MG capsule Take 20 mg by mouth 2 (two) times daily. Before LUNCH and Before SUPPER    . Probiotic Product (PROBIOTIC DAILY PO) Take 1 capsule by mouth daily.     . psyllium (METAMUCIL) 58.6 % powder Take 1 packet by mouth daily.    . tamsulosin (FLOMAX) 0.4 MG CAPS capsule Take 0.4 mg by mouth at bedtime.    . traMADol (ULTRAM) 50 MG tablet Take 50 mg by mouth every 6 (six) hours as needed.    . zolpidem (AMBIEN) 10 MG tablet Take 5 tablets by mouth at bedtime.     No current facility-administered medications for this visit.     Past Surgical History:  Procedure Laterality Date  . BREAST BIOPSY Right 08/17/2013   Procedure: RIGHT BREAST BIOPSY;  Surgeon: [name], MD;  Location: [facility];  Service: General;  Laterality: Right;  . CHOLECYSTECTOMY  1990s  . COLONOSCOPY  03/17/2012   Rourk; colonic diverticulosis, repeat screening colonoscopy in 10 years.  . CORONARY ARTERY BYPASS GRAFT  2000  . ESOPHAGOGASTRODUODENOSCOPY  May 2009   Rourk: Food impaction, critical esophageal ring/stricture requiring dilation  . HEMORRHOID SURGERY  2007   total of three  . HIATAL HERNIA REPAIR  1995  . LEFT HEART CATH AND CORS/GRAFTS ANGIOGRAPHY N/A  08/23/2016   Procedure: Left Heart Cath and Cors/Grafts Angiography;  Surgeon: Leonie Man, MD;  Location: [place] [facility];  Service: Cardiovascular;  Laterality: N/A;  . LUMBAR LAMINECTOMY  1999     Allergies  Allergen Reactions  . Meperidine Nausea And Vomiting and Nausea Only  . Meperidine Hcl Nausea Only  . Statins Other (See Comments)    Severe muscle pain  . Codeine Nausea And Vomiting  . Hydralazine     neuropathy   . Nalbuphine Nausea And Vomiting  . Spironolactone     MUSCLE PAIN       Family History  Problem Relation Age of Onset  . Coronary artery disease Father        also grandfather  . Colon cancer Neg Hx      Social History [name] reports that he has never smoked. He has never used smokeless tobacco. [name] reports previous alcohol use of about 2.0 standard drinks of alcohol per week.   Review of Systems CONSTITUTIONAL: No weight loss, fever, chills, weakness or fatigue.  HEENT: Eyes: No visual loss, blurred vision, double vision or yellow sclerae.No hearing loss, sneezing, congestion, runny nose or sore throat.  SKIN: No rash or itching.  CARDIOVASCULAR: per hpi RESPIRATORY: No shortness of breath, cough or sputum.  GASTROINTESTINAL: No anorexia, nausea, vomiting or diarrhea. No abdominal pain or blood.  GENITOURINARY: No burning on urination, no polyuria NEUROLOGICAL: No headache, dizziness, syncope, paralysis, ataxia, numbness or tingling in the extremities. No change in bowel or bladder control.  MUSCULOSKELETAL: No muscle, back pain, joint pain or stiffness.  LYMPHATICS: No enlarged nodes. No history of splenectomy.  PSYCHIATRIC:  No history of depression or anxiety.  ENDOCRINOLOGIC: No reports of sweating, cold or heat intolerance. No polyuria or polydipsia.  Marland Kitchen   Physical Examination Today's Vitals   07/13/20 1545  BP: (!) 160/80  Pulse: 72  SpO2: 98%  Weight: 169 lb 6.4 oz (76.8 kg)  Height: 5\' 10"  (1.778 m)   Body mass  index is 24.31 kg/m.  Gen: resting comfortably, no acute distress HEENT: no scleral icterus, pupils equal round and reactive, no palptable cervical adenopathy,  CV: RRR, no m/r/g, no jvd Resp: Clear to auscultation bilaterally GI: abdomen is soft, non-tender, non-distended, normal bowel sounds, no hepatosplenomegaly MSK: extremities are warm, no edema.  Skin: warm, no rash Neuro:  no focal deficits Psych: appropriate affect   Diagnostic Studies 06/2015 echo Study Conclusions  - Left ventricle: The cavity size was normal. Wall thickness was  increased increased in a pattern of mild to moderate LVH.  Systolic function was normal. The estimated ejection fraction was  in the range of 60% to 65%. Wall motion was normal; there were no  regional wall motion abnormalities. Left ventricular diastolic  function parameters were normal. - Aortic valve: Mildly calcified annulus. Trileaflet; mildly  thickened leaflets. Valve area (VTI): 2.58 cm^2. Valve area  (Vmax): 2.89 cm^2. - Mitral valve: There was mild regurgitation. - Systemic veins: Small IVC, suggesting low RA pressures and  hypovolemia. - Technically adequate study.   06/2015 Exercise Stress MPI  Blood pressure demonstrated a hypertensive response to exercise.  The study is normal.  This is a low risk study.  The left ventricular ejection fraction is normal (55-65%).   08/2016 cath  Mid RCA lesion, 80 %stenosed. Dist RCA lesion, 100 %stenosed.  SVG-mRPDA graft was visualized by angiography and is large and anatomically normal.  Potential Culpril (not PCI Target). Ost RPDA lesion, 90 %stenosed, Ost RPDA to RPDA lesion, 70 %stenosed.- filled retrograde from SVG  Ost 3rd Mrg to 3rd Mrg lesion, 80 %stenosed.  SVG-3rd OM graft was visualized by angiography and is large and anatomically normal.  Y Graft SVG-Lat 3rd OM is moderate in size and anatomically normal. Origin lesion, 40 %stenosed.  Ost LAD to Prox  LAD lesion, 80 %stenosed. Prox LAD to Mid LAD lesion, 99 %stenosed.  LIMA graft was visualized by angiography and is normal in caliber and anatomically normal.  The left ventricular systolic function is normal. The left ventricular ejection fraction is 55-65% by visual estimate.  LV end diastolic pressure is normal.  No obvious culprit lesions found. Clearly has small vessel disease involving the nongrafted diagonal Nader Boys but this is not a very approachable PCI target. The other potential culprit is the severe disease in the retrograde flow from SVG-RPDA back to the PL system. Is also not PCI target.  Recommend: Optimize medical management    Assessment and Plan  1. CAD -no symptoms, continue current meds EKG today SR, no acute ischemic changes  2. HTN -Bradycardia on beta blockers, AKI on ACEI, also avoiding diuretic due to renal dysfunction. Allergies to hydralazine and aldactone.  - given medication limitations home bp's are reasonable. Could consider clonidine if significant utprend in bp's.   3. Hyperlipidemia - request labs from pcp - intolerant to statins, on bempedoic acid as part of clinical trial  F/u 6 months    Arnoldo Lenis, M.D.

## 2020-07-13 NOTE — Patient Instructions (Signed)
Your physician recommends that you schedule a follow-up appointment in: [place]  Your physician recommends that you continue on your current medications as directed. Please refer to the Current Medication list given to you today.   [email or web address]  DASH Eating Plan DASH stands for Dietary Approaches to Stop Hypertension. The DASH eating plan is a healthy eating plan that has been shown to:  Reduce high blood pressure (hypertension).  Reduce your risk for type 2 diabetes, heart disease, and stroke.  Help with weight loss. What are tips for following this plan? Reading food labels  Check food labels for the amount of salt (sodium) per serving. Choose foods with less than 5 percent of the Daily Value of sodium. Generally, foods with less than 300 milligrams (mg) of sodium per serving fit into this eating plan.  To find whole grains, look for the word "whole" as the first word in the ingredient list. Shopping  Buy products labeled as "low-sodium" or "no salt added."  Buy fresh foods. Avoid canned foods and pre-made or frozen meals. Cooking  Avoid adding salt when cooking. Use salt-free seasonings or herbs instead of table salt or sea salt. Check with your health care provider or pharmacist before using salt substitutes.  Do not fry foods. Cook foods using healthy methods such as baking, boiling, grilling, roasting, and broiling instead.  Cook with heart-healthy oils, such as olive, canola, avocado, soybean, or sunflower oil. Meal planning  Eat a balanced diet that includes: ? 4 or more servings of fruits and 4 or more servings of vegetables each day. Try to fill one-half of your plate with fruits and vegetables. ? 6-8 servings of whole grains each day. ? Less than 6 oz (170 g) of lean meat, poultry, or fish each day. A 3-oz (85-g) serving of meat is about the same size as a deck of cards. One egg equals 1 oz (28  g). ? 2-3 servings of low-fat dairy each day. One serving is 1 cup (237 mL). ? 1 serving of nuts, seeds, or beans 5 times each week. ? 2-3 servings of heart-healthy fats. Healthy fats called omega-3 fatty acids are found in foods such as walnuts, flaxseeds, fortified milks, and eggs. These fats are also found in cold-water fish, such as sardines, salmon, and mackerel.  Limit how much you eat of: ? Canned or prepackaged foods. ? Food that is high in trans fat, such as some fried foods. ? Food that is high in saturated fat, such as fatty meat. ? Desserts and other sweets, sugary drinks, and other foods with added sugar. ? Full-fat dairy products.  Do not salt foods before eating.  Do not eat more than 4 egg yolks a week.  Try to eat at least 2 vegetarian meals a week.  Eat more home-cooked food and less restaurant, buffet, and fast food.   Lifestyle  When eating at a restaurant, ask that your food be prepared with less salt or no salt, if possible.  If you drink alcohol: ? Limit how much you use to:  0-1 drink a day for women who are not pregnant.  0-2 drinks a day for men. ? Be aware of how much alcohol is in your drink. In the [place] one drink equals one 12 oz bottle of beer (355 mL), one 5 oz glass of wine (148 mL), or one 1 oz glass of hard liquor (44 mL). General information  Avoid eating more than 2,300 mg of salt a day.  If you have hypertension, you may need to reduce your sodium intake to 1,500 mg a day.  Work with your health care provider to maintain a healthy body weight or to lose weight. Ask what an ideal weight is for you.  Get at least 30 minutes of exercise that causes your heart to beat faster (aerobic exercise) most days of the week. Activities may include walking, swimming, or biking.  Work with your health care provider or dietitian to adjust your eating plan to your individual calorie needs. What foods should I eat? Fruits All fresh, dried, or frozen fruit.  Canned fruit in natural juice (without added sugar). Vegetables Fresh or frozen vegetables (raw, steamed, roasted, or grilled). Low-sodium or reduced-sodium tomato and vegetable juice. Low-sodium or reduced-sodium tomato sauce and tomato paste. Low-sodium or reduced-sodium canned vegetables. Grains Whole-grain or whole-wheat bread. Whole-grain or whole-wheat pasta. Brown rice. [place]. Bulgur. Whole-grain and low-sodium cereals. Pita bread. Low-fat, low-sodium crackers. Whole-wheat flour tortillas. Meats and other proteins Skinless chicken or [place]. Ground chicken or [place]. Pork with fat trimmed off. Fish and seafood. Egg whites. Dried beans, peas, or lentils. Unsalted nuts, nut butters, and seeds. Unsalted canned beans. Lean cuts of beef with fat trimmed off. Low-sodium, lean precooked or cured meat, such as sausages or meat loaves. Dairy Low-fat (1%) or fat-free (skim) milk. Reduced-fat, low-fat, or fat-free cheeses. Nonfat, low-sodium ricotta or cottage cheese. Low-fat or nonfat yogurt. Low-fat, low-sodium cheese. Fats and oils Soft margarine without trans fats. Vegetable oil. Reduced-fat, low-fat, or light mayonnaise and salad dressings (reduced-sodium). Canola, safflower, olive, avocado, soybean, and sunflower oils. Avocado. Seasonings and condiments Herbs. Spices. Seasoning mixes without salt. Other foods Unsalted popcorn and pretzels. Fat-free sweets. The items listed above may not be a complete list of foods and beverages you can eat. Contact a dietitian for more information. What foods should I avoid? Fruits Canned fruit in a light or heavy syrup. Fried fruit. Fruit in cream or butter sauce. Vegetables Creamed or fried vegetables. Vegetables in a cheese sauce. Regular canned vegetables (not low-sodium or reduced-sodium). Regular canned tomato sauce and paste (not low-sodium or reduced-sodium). Regular tomato and vegetable juice (not low-sodium or reduced-sodium). Angie Fava.  Olives. Grains Baked goods made with fat, such as croissants, muffins, or some breads. Dry pasta or rice meal packs. Meats and other proteins Fatty cuts of meat. Ribs. Fried meat. Berniece Salines. Bologna, salami, and other precooked or cured meats, such as sausages or meat loaves. Fat from the back of a pig (fatback). Bratwurst. Salted nuts and seeds. Canned beans with added salt. Canned or smoked fish. Whole eggs or egg yolks. Chicken or [place] with skin. Dairy Whole or 2% milk, cream, and half-and-half. Whole or full-fat cream cheese. Whole-fat or sweetened yogurt. Full-fat cheese. Nondairy creamers. Whipped toppings. Processed cheese and cheese spreads. Fats and oils Butter. Stick margarine. Lard. Shortening. Ghee. Bacon fat. Tropical oils, such as coconut, palm kernel, or palm oil. Seasonings and condiments Onion salt, garlic salt, seasoned salt, table salt, and sea salt. Worcestershire sauce. Tartar sauce. Barbecue sauce. Teriyaki sauce. Soy sauce, including reduced-sodium. Steak sauce. Canned and packaged gravies. Fish sauce. Oyster sauce. Cocktail sauce. Store-bought horseradish. Ketchup. Mustard. Meat flavorings and tenderizers. Bouillon cubes. Hot sauces. Pre-made or packaged marinades. Pre-made or packaged taco seasonings. Relishes. Regular salad dressings. Other foods Salted popcorn and pretzels. The items listed above may not be a complete list of foods and beverages you should avoid. Contact a dietitian for more information. Where to find more information  National Heart, Lung, and Blood Institute: https://wilson-eaton.com/  American Heart Association: www.heart.org  Academy of Nutrition and Dietetics: www.eatright.[place]: www.kidney.org Summary  The DASH eating plan is a healthy eating plan that has been shown to reduce high blood pressure (hypertension). It may also reduce your risk for type 2 diabetes, heart disease, and stroke.  When on the DASH eating plan, aim to  eat more fresh fruits and vegetables, whole grains, lean proteins, low-fat dairy, and heart-healthy fats.  With the DASH eating plan, you should limit salt (sodium) intake to 2,300 mg a day. If you have hypertension, you may need to reduce your sodium intake to 1,500 mg a day.  Work with your health care provider or dietitian to adjust your eating plan to your individual calorie needs. This information is not intended to replace advice given to you by your health care provider. Make sure you discuss any questions you have with your health care provider. Document Revised: 03/06/2019 Document Reviewed: 03/06/2019 Elsevier Patient Education  2021 [employer].

## 2020-07-14 ENCOUNTER — Other Ambulatory Visit: Payer: Self-pay | Admitting: Cardiology

## 2020-07-20 ENCOUNTER — Ambulatory Visit (INDEPENDENT_AMBULATORY_CARE_PROVIDER_SITE_OTHER): Payer: Medicare Other | Admitting: *Deleted

## 2020-07-20 ENCOUNTER — Other Ambulatory Visit: Payer: Self-pay

## 2020-07-20 DIAGNOSIS — C44319 Basal cell carcinoma of skin of other parts of face: Secondary | ICD-10-CM

## 2020-07-20 DIAGNOSIS — C44310 Basal cell carcinoma of skin of unspecified parts of face: Secondary | ICD-10-CM

## 2020-07-20 NOTE — Progress Notes (Signed)
Here for nurse to see for suture removal- pathology to patient, no signs or symptoms of infections, excision healing well.

## 2020-07-25 ENCOUNTER — Ambulatory Visit (INDEPENDENT_AMBULATORY_CARE_PROVIDER_SITE_OTHER): Payer: Medicare Other | Admitting: Physician Assistant

## 2020-07-25 ENCOUNTER — Encounter: Payer: Self-pay | Admitting: Physician Assistant

## 2020-07-25 ENCOUNTER — Other Ambulatory Visit: Payer: Self-pay

## 2020-07-25 DIAGNOSIS — D0461 Carcinoma in situ of skin of right upper limb, including shoulder: Secondary | ICD-10-CM

## 2020-07-25 DIAGNOSIS — Z86018 Personal history of other benign neoplasm: Secondary | ICD-10-CM | POA: Diagnosis not present

## 2020-07-25 DIAGNOSIS — L570 Actinic keratosis: Secondary | ICD-10-CM

## 2020-07-25 DIAGNOSIS — Z85828 Personal history of other malignant neoplasm of skin: Secondary | ICD-10-CM | POA: Diagnosis not present

## 2020-07-25 DIAGNOSIS — I2510 Atherosclerotic heart disease of native coronary artery without angina pectoris: Secondary | ICD-10-CM

## 2020-07-25 DIAGNOSIS — D485 Neoplasm of uncertain behavior of skin: Secondary | ICD-10-CM

## 2020-07-25 NOTE — Progress Notes (Addendum)
   Follow-Up Visit   Subjective  [name] is a 75 y.o. male who presents for the following: Skin Problem (Here for LN2- middle of lower lip x 2 months - crusty & mid back x 2 months- crusty. Per records reviewed multiple non mole skin cancers, no h/o of melanoma).   The following portions of the chart were reviewed this encounter and updated as appropriate:  Tobacco  Allergies  Meds  Problems  Med Hx  Surg Hx  Fam Hx      Objective  Well appearing patient in no apparent distress; mood and affect are within normal limits.  All skin waist up examined.  Objective  Left Forearm - Posterior, Left Lower Vermilion Lip, Mid Back (8), Right Forearm - Posterior (10): Erythematous patches with gritty scale.  Objective  Right 3rd Finger Metacarpophalangeal Joint: Volcano growth on pink base      Assessment & Plan  AK (actinic keratosis) (20) Left Forearm - Posterior; Right Forearm - Posterior (10); Mid Back (8); Left Lower Vermilion Lip  Destruction of lesion - Left Forearm - Posterior, Left Lower Vermilion Lip, Mid Back, Right Forearm - Posterior Complexity: simple   Destruction method: cryotherapy   Informed consent: discussed and consent obtained   Timeout:  patient name, date of birth, surgical site, and procedure verified Lesion destroyed using liquid nitrogen: Yes   Cryotherapy cycles:  3 Outcome: patient tolerated procedure well with no complications    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 Right 3rd Finger Metacarpophalangeal Joint  Skin / nail biopsy Type of biopsy: tangential   Informed consent: discussed and consent obtained   Timeout: patient name, date of birth, surgical site, and procedure verified   Procedure prep:  Patient was prepped and draped in usual sterile fashion (Non sterile) Prep type:  Chlorhexidine Anesthesia: the lesion was anesthetized in a standard fashion   Anesthetic:  1% lidocaine w/ epinephrine  1-100,000 local infiltration Instrument used: flexible razor blade   Outcome: patient tolerated procedure well   Post-procedure details: wound care instructions given    Destruction of lesion Complexity: simple   Destruction method: electrodesiccation and curettage   Informed consent: discussed and consent obtained   Timeout:  patient name, date of birth, surgical site, and procedure verified Anesthesia: the lesion was anesthetized in a standard fashion   Anesthetic:  1% lidocaine w/ epinephrine 1-100,000 local infiltration Curettage performed in three different directions: Yes   Curettage cycles:  1.1 Margin per side (cm):  0.1 Final wound size (cm):  1.3 Hemostasis achieved with:  aluminum chloride Outcome: patient tolerated procedure well with no complications   Post-procedure details: wound care instructions given    Specimen 1 - Surgical pathology Differential Diagnosis: bcc vs scc- txpbx  Check Margins: No   I, Joia Doyle, PA-C, have reviewed all documentation's for this visit.  The documentation on 08/03/20 for the exam, diagnosis, procedures and orders are all accurate and complete.

## 2020-07-25 NOTE — Patient Instructions (Signed)

## 2020-08-04 ENCOUNTER — Telehealth: Payer: Self-pay | Admitting: *Deleted

## 2020-08-04 NOTE — Telephone Encounter (Signed)
pathology to patient wife ann.

## 2020-08-04 NOTE — Telephone Encounter (Signed)
-----   Message from Warren Danes, [place] sent at 08/03/2020  5:56 PM EDT ----- Treated with biopsy. RTC if recurs. 3 month follow up

## 2020-08-09 DIAGNOSIS — I8312 Varicose veins of left lower extremity with inflammation: Secondary | ICD-10-CM | POA: Diagnosis not present

## 2020-08-09 DIAGNOSIS — I8311 Varicose veins of right lower extremity with inflammation: Secondary | ICD-10-CM | POA: Diagnosis not present

## 2020-08-13 DIAGNOSIS — E7849 Other hyperlipidemia: Secondary | ICD-10-CM | POA: Diagnosis not present

## 2020-08-13 DIAGNOSIS — I129 Hypertensive chronic kidney disease with stage 1 through stage 4 chronic kidney disease, or unspecified chronic kidney disease: Secondary | ICD-10-CM | POA: Diagnosis not present

## 2020-08-13 DIAGNOSIS — N183 Chronic kidney disease, stage 3 unspecified: Secondary | ICD-10-CM | POA: Diagnosis not present

## 2020-08-15 DIAGNOSIS — E559 Vitamin D deficiency, unspecified: Secondary | ICD-10-CM | POA: Diagnosis not present

## 2020-08-15 DIAGNOSIS — I129 Hypertensive chronic kidney disease with stage 1 through stage 4 chronic kidney disease, or unspecified chronic kidney disease: Secondary | ICD-10-CM | POA: Diagnosis not present

## 2020-08-15 DIAGNOSIS — N1832 Chronic kidney disease, stage 3b: Secondary | ICD-10-CM | POA: Diagnosis not present

## 2020-08-22 ENCOUNTER — Other Ambulatory Visit: Payer: Medicare Other

## 2020-08-22 ENCOUNTER — Other Ambulatory Visit: Payer: Self-pay

## 2020-08-22 DIAGNOSIS — R972 Elevated prostate specific antigen [PSA]: Secondary | ICD-10-CM | POA: Diagnosis not present

## 2020-08-23 ENCOUNTER — Other Ambulatory Visit: Payer: Self-pay | Admitting: Urology

## 2020-08-23 DIAGNOSIS — R972 Elevated prostate specific antigen [PSA]: Secondary | ICD-10-CM

## 2020-08-23 LAB — PSA: Prostate Specific Ag, Serum: 5.7 ng/mL — ABNORMAL HIGH (ref 0.0–4.0)

## 2020-08-24 ENCOUNTER — Telehealth: Payer: Self-pay

## 2020-08-24 NOTE — Telephone Encounter (Signed)
-----   Message from Franchot Gallo, MD sent at 08/23/2020  4:59 PM EDT ----- Notify the patient that his PSA is still up at 5.7, when corrected for finasteride it is 11.4.  I would suggest we look to do an MRI of his prostate in [facility] imaging.  If something looks suspicious on that, we can then proceed with biopsy.  I put order in ----- Message ----- From: Iris Pert, LPN Sent: 12/29/7827   8:20 AM EDT To: Franchot Gallo, MD  Please review

## 2020-08-24 NOTE — Telephone Encounter (Signed)
Patient called and made aware.

## 2020-08-26 DIAGNOSIS — E559 Vitamin D deficiency, unspecified: Secondary | ICD-10-CM | POA: Diagnosis not present

## 2020-08-26 DIAGNOSIS — N1831 Chronic kidney disease, stage 3a: Secondary | ICD-10-CM | POA: Diagnosis not present

## 2020-08-26 DIAGNOSIS — I129 Hypertensive chronic kidney disease with stage 1 through stage 4 chronic kidney disease, or unspecified chronic kidney disease: Secondary | ICD-10-CM | POA: Diagnosis not present

## 2020-09-09 ENCOUNTER — Other Ambulatory Visit: Payer: Self-pay | Admitting: Cardiology

## 2020-09-14 ENCOUNTER — Ambulatory Visit
Admission: RE | Admit: 2020-09-14 | Discharge: 2020-09-14 | Disposition: A | Discharge status: Home / Self Care | Payer: Medicare Other | Source: Ambulatory Visit | Attending: Urology | Admitting: Urology

## 2020-09-14 ENCOUNTER — Other Ambulatory Visit: Payer: Self-pay

## 2020-09-14 DIAGNOSIS — R972 Elevated prostate specific antigen [PSA]: Secondary | ICD-10-CM

## 2020-09-14 MED ORDER — GADOBENATE DIMEGLUMINE 529 MG/ML IV SOLN
16.0000 mL | Freq: Once | INTRAVENOUS | Status: AC | PRN
  Administered 2020-09-14: 16 mL via INTRAVENOUS

## 2020-09-20 ENCOUNTER — Telehealth: Payer: Self-pay

## 2020-09-20 ENCOUNTER — Other Ambulatory Visit: Payer: Self-pay | Admitting: Urology

## 2020-09-20 DIAGNOSIS — R972 Elevated prostate specific antigen [PSA]: Secondary | ICD-10-CM

## 2020-09-20 MED ORDER — LEVOFLOXACIN 750 MG PO TABS: 750.0000 mg | ORAL_TABLET | Freq: Every day | ORAL | 0 refills | Status: AC

## 2020-09-20 NOTE — Telephone Encounter (Signed)
Patient given results- patient feels he has "possible prostate infection" pt has had intermittent burning. Denies any blood in urine.  Patient would like to have his PSA rechecked and possibly treated for prostate infection before pursuing prostate biopsy.  Message sent to MD

## 2020-09-20 NOTE — Telephone Encounter (Signed)
-----   Message from Franchot Gallo, MD sent at 09/20/2020  4:12 PM EDT ----- Notify patient-MRI showed no significantly suspicious areas that needed to be targeted with biopsy, but with PSA still on an upward trend, I would recommend we repeat a biopsy at [facility].  I put orders in. ----- Message ----- From: Dorisann Frames, RN Sent: 09/15/2020   9:21 AM EDT To: Franchot Gallo, MD  Please review

## 2020-09-20 NOTE — Telephone Encounter (Signed)
-----   Message from Franchot Gallo, MD sent at 09/20/2020  4:12 PM EDT ----- Notify patient-MRI showed no significantly suspicious areas that needed to be targeted with biopsy, but with PSA still on an upward trend, I would recommend we repeat a biopsy at [facility].  I put orders in. ----- Message ----- From: Dorisann Frames, RN Sent: 09/15/2020   9:21 AM EDT To: Franchot Gallo, MD  Please review

## 2020-09-21 ENCOUNTER — Other Ambulatory Visit: Payer: Medicare Other

## 2020-09-21 NOTE — Telephone Encounter (Signed)
Message left to return call.

## 2020-09-22 ENCOUNTER — Other Ambulatory Visit: Payer: Medicare Other

## 2020-09-22 ENCOUNTER — Other Ambulatory Visit: Payer: Self-pay

## 2020-09-22 DIAGNOSIS — R972 Elevated prostate specific antigen [PSA]: Secondary | ICD-10-CM | POA: Diagnosis not present

## 2020-09-22 LAB — URINALYSIS, ROUTINE W REFLEX MICROSCOPIC
Bilirubin, UA: NEGATIVE
Glucose, UA: NEGATIVE
Ketones, UA: NEGATIVE
Leukocytes,UA: NEGATIVE
Nitrite, UA: NEGATIVE
Protein,UA: NEGATIVE
RBC, UA: NEGATIVE
Specific Gravity, UA: 1.01 (ref 1.005–1.030)
Urobilinogen, Ur: 0.2 mg/dL (ref 0.2–1.0)
pH, UA: 5.5 (ref 5.0–7.5)

## 2020-09-23 ENCOUNTER — Telehealth: Payer: Self-pay

## 2020-09-23 DIAGNOSIS — R972 Elevated prostate specific antigen [PSA]: Secondary | ICD-10-CM

## 2020-09-23 LAB — PSA: Prostate Specific Ag, Serum: 3.7 ng/mL (ref 0.0–4.0)

## 2020-09-23 NOTE — Telephone Encounter (Signed)
Patient called and made aware.

## 2020-09-23 NOTE — Telephone Encounter (Signed)
-----   Message from Franchot Gallo, MD sent at 09/23/2020  9:59 AM EDT ----- Hey--did not see this psa until after I signed off on u/a--psa is 2 points lower, u/a clear so I do not think we need to pursue repeat bx at this time--just make sure he has routine followup planned ----- Message ----- From: Iris Pert, LPN Sent: 2/82/4175   8:26 AM EDT To: Franchot Gallo, MD  Please review

## 2020-10-28 DIAGNOSIS — M1711 Unilateral primary osteoarthritis, right knee: Secondary | ICD-10-CM | POA: Diagnosis not present

## 2020-11-02 ENCOUNTER — Ambulatory Visit (INDEPENDENT_AMBULATORY_CARE_PROVIDER_SITE_OTHER): Payer: Medicare Other | Admitting: Cardiology

## 2020-11-02 ENCOUNTER — Encounter: Payer: Self-pay | Admitting: *Deleted

## 2020-11-02 ENCOUNTER — Encounter: Payer: Self-pay | Admitting: Cardiology

## 2020-11-02 VITALS — BP 140/60 | HR 88 | Ht 70.0 in | Wt 167.0 lb

## 2020-11-02 DIAGNOSIS — I2510 Atherosclerotic heart disease of native coronary artery without angina pectoris: Secondary | ICD-10-CM | POA: Diagnosis not present

## 2020-11-02 DIAGNOSIS — I10 Essential (primary) hypertension: Secondary | ICD-10-CM | POA: Diagnosis not present

## 2020-11-02 DIAGNOSIS — E782 Mixed hyperlipidemia: Secondary | ICD-10-CM

## 2020-11-02 NOTE — Progress Notes (Signed)
Clinical Summary Mr. Cartlidge is a 75 y.o.maleseen today for follow up of the following medical problems.    1. CAD - CABG in 2000 - 06/2015 completed echo which showed normal LVEF. Exercise MPI he did 8 min 25 sec with no EKG changes and no ischemia by imaging. - has not been on ACE-I since prior admission with AKI, had also been on NSAIDs at that time.    - admit 08/2016 with NSTEMI, cath as reported below, no interventional targets, managed medically. Imdur added to medical regimen and lopressor 12.5mg  bid added.         - no recent chest pains - he had some questions about a recent EKG showing inferior Qwaves and if that was a significant sig. We discussed his prior EKGs and cardiac disease in relation to this finding.    2. HTN - metoprolol stopped due to low HR's in 09/2017 - prior AKI on ACE-I, though heavy NSAID use at the time also -listed hydralazine, aldactone allergies.      - we previously increased norvasc to 10mg  daily. Reports some prior swelling in the past on norvasc but has tolerated reasonablly well since then - reports high sodium intake around the time of swelling. After about a day the swelling resolved on its own.     - home bp's 130s-140s/60s-70s   3. Hyperlipidemia - leg pains on vytorin. Did not tolerate low dose pravastatin. - he enrolled in clinical trial for cholesterol medication. Bempedoic acid trial.    - still participating in lipid study.  - he is back on pravastatin 20mg  daily and tolerating. Bempedoic acid       4. CKD - followed by nephrology by Dr Theador Hawthorne  Last Cr up to 1.65   5. LE edema -imprioved with prn lasix, had recent varicose vein procedure which helped alot   6. Varicose veins - prior intervention   Past Medical History:  Diagnosis Date   Arteriosclerotic cardiovascular disease (ASCVD)    CABG in 08/1998  (LIMA-mLAD, SVG-rPDA, SVG-OM3-with Y SVG-lateral OM3.)   Atypical nevus 11/29/2005   slight atypia - left hand    Basal cell carcinoma of skin 03/22/2004   Left scapula, superior - CX3 + 5FU   Basal cell carcinoma of skin 03/22/2004   Left scapula, inferior - CX3 + excision   Basal cell carcinoma of skin 03/22/2004   Center mid back - CX3 + 5FU   Basal cell carcinoma of skin 07/24/2004   Left shin - CX3 + 5FU   Basal cell carcinoma of skin 11/29/2005   Left shoulder - CX3 + 5FU   Basal cell carcinoma of skin 10/22/2006   ulcerated on right outer knee - CX3+5FU   Basal cell carcinoma of skin 12/02/2008   left upper arm, superior - CX3+5FU   Basal cell carcinoma of skin 05/16/2011   right shoulder   Basal cell carcinoma of skin 05/16/2011   ulcerated above left sideburn   Basal cell carcinoma of skin 05/16/2011   lateral left arm   Basal cell carcinoma of skin 06/29/2015   nodular on left ear - tx p bx   Basal cell carcinoma of skin 06/08/2016   superficial on left scapha, ear (MOHs)   Basal cell carcinoma of skin 07/25/2017   nodular on left ear (MOHS)   Basal cell carcinoma of skin 07/25/2017   nodular on left thigh - tx p bx   Basal cell carcinoma of skin 11/18/2018  nodular on right nare - tx p bx   BCC (basal cell carcinoma of skin) 08/25/2019   left upper arm anterior (CX35FU)   BCC (basal cell carcinoma of skin) 08/25/2019   right chin   BPH (benign prostatic hypertrophy)    h/x of prostatis/ UTI in 2006   Chronic kidney disease, stage II (mild) 2010   Creatinine of 1.5 in 2010   GERD (gastroesophageal reflux disease)    s/p espohageal dilatatin and repair of hiatal hernia   Hyperlipidemia    Hypertension    IBS (irritable bowel syndrome)    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   Neck-posterior   PONV (postoperative nausea and vomiting)    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   right buccal cheek (CX35FU)   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   right elbow(CX35FU)   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   Right Forearm Upper(well diff) (tx  p bx)   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   Right Index Finger(in situ) (tx p bx)   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   Left Forearm,upper(in situ) (tx p bx)   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/18/2018   Base of Left Middle Finger(in situ) (tx p bx)   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/18/2018   Right Outer Forearm,sup(in situ) (tx p bx)   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/18/2018   Right Forearm,Inf(in situ) (tx p bx)   Sigmoid diverticulosis    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   in situ on right hand - CX3 + 5FU   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   in situ on right ear - CX3 + 5FU   Squamous cell carcinoma of skin 01/21/2006   in situ on upper mid back - tx p bx   Squamous cell carcinoma of skin 01/21/2006   in situ on top left shoulder - tx p bx   Squamous cell carcinoma of skin 10/22/2006   KA under left eye - MOHs   Squamous cell carcinoma of skin 12/02/2008   in situ on left base of 4th finger - CX3+5FU   Squamous cell carcinoma of skin 08/30/2009   well differentiated on right jawline   Squamous cell carcinoma of skin 08/30/2009   in situ on left upper arm - CX3+imiquimod   Squamous cell carcinoma of skin 02/25/2014   in situ on left temple   Squamous cell carcinoma of skin 02/25/2014   in situ on left arm   Squamous cell carcinoma of skin 06/08/2016   in situ on left lower side neck - tx p bx   Squamous cell carcinoma of skin 06/08/2016   left shin - tx p bx   Squamous cell carcinoma of skin 06/08/2016   in situ on right temple - tx p bx   Squamous cell carcinoma of skin 06/08/2016   in situ on right jawline - tx p bx     Allergies  Allergen Reactions   Meperidine Nausea And Vomiting and Nausea Only   Meperidine Hcl Nausea Only   Statins Other (See Comments)    Severe muscle pain   Codeine Nausea And Vomiting   Hydralazine     neuropathy    Nalbuphine Nausea And Vomiting    Spironolactone     MUSCLE PAIN      Current Outpatient Medications  Medication Sig Dispense Refill   acetaminophen (TYLENOL) 325 MG tablet Take 650 mg by mouth  every 6 (six) hours as needed for moderate pain.     amLODipine (NORVASC) 10 MG tablet TAKE 1 TABLET BY MOUTH EVERY DAY 30 tablet 6   aspirin 81 MG EC tablet Take 81 mg by mouth at bedtime.     Bempedoic Acid 180 MG TABS Take by mouth.     cholecalciferol (VITAMIN D3) 25 MCG (1000 UNIT) tablet Take 1,000 Units by mouth daily.     finasteride (PROSCAR) 5 MG tablet Take 1 tablet (5 mg total) by mouth every evening. 30 tablet 5   furosemide (LASIX) 20 MG tablet      Investigational - Study Medication Take 1 tablet by mouth every morning. Study name:Bempedoic Acid 180mg  Additional study details:Cholesterol medication     isosorbide mononitrate (IMDUR) 30 MG 24 hr tablet TAKE 1/2 TABLET BY MOUTH DAILY 45 tablet 2   omeprazole (PRILOSEC) 20 MG capsule Take 20 mg by mouth 2 (two) times daily. Before LUNCH and Before SUPPER     Probiotic Product (PROBIOTIC DAILY PO) Take 1 capsule by mouth daily.      psyllium (METAMUCIL) 58.6 % powder Take 1 packet by mouth daily.     tamsulosin (FLOMAX) 0.4 MG CAPS capsule Take 0.4 mg by mouth at bedtime.     traMADol (ULTRAM) 50 MG tablet Take 50 mg by mouth every 6 (six) hours as needed.     zolpidem (AMBIEN) 10 MG tablet Take 5 tablets by mouth at bedtime.     No current facility-administered medications for this visit.     Past Surgical History:  Procedure Laterality Date   BREAST BIOPSY Right 08/17/2013   Procedure: RIGHT BREAST BIOPSY;  Surgeon: [name], MD;  Location: [facility];  Service: General;  Laterality: Right;   CHOLECYSTECTOMY  1990s   COLONOSCOPY  03/17/2012   [place]; colonic diverticulosis, repeat screening colonoscopy in 10 years.   CORONARY ARTERY BYPASS GRAFT  2000   ESOPHAGOGASTRODUODENOSCOPY  May 2009   [place]: Food impaction, critical esophageal ring/stricture requiring  dilation   HEMORRHOID SURGERY  2007   total of three   [facility] CATH AND CORS/GRAFTS ANGIOGRAPHY N/A 08/23/2016   Procedure: Left Heart Cath and Cors/Grafts Angiography;  Surgeon: Leonie Man, MD;  Location: [facility];  Service: Cardiovascular;  Laterality: N/A;   LUMBAR LAMINECTOMY  1999     Allergies  Allergen Reactions   Meperidine Nausea And Vomiting and Nausea Only   Meperidine Hcl Nausea Only   Statins Other (See Comments)    Severe muscle pain   Codeine Nausea And Vomiting   Hydralazine     neuropathy    Nalbuphine Nausea And Vomiting   Spironolactone     MUSCLE PAIN       Family History  Problem Relation Age of Onset   Coronary artery disease Father        also grandfather   Colon cancer Neg Hx      Social History Mr. Pryor reports that he has never smoked. He has never used smokeless tobacco. Mr. Schoon reports previous alcohol use of about 2.0 standard drinks of alcohol per week.   Review of Systems CONSTITUTIONAL: No weight loss, fever, chills, weakness or fatigue.  HEENT: Eyes: No visual loss, blurred vision, double vision or yellow sclerae.No hearing loss, sneezing, congestion, runny nose or sore throat.  SKIN: No rash or itching.  CARDIOVASCULAR: per hpi RESPIRATORY: No shortness of breath, cough or sputum.  GASTROINTESTINAL:  No anorexia, nausea, vomiting or diarrhea. No abdominal pain or blood.  GENITOURINARY: No burning on urination, no polyuria NEUROLOGICAL: No headache, dizziness, syncope, paralysis, ataxia, numbness or tingling in the extremities. No change in bowel or bladder control.  MUSCULOSKELETAL: No muscle, back pain, joint pain or stiffness.  LYMPHATICS: No enlarged nodes. No history of splenectomy.  PSYCHIATRIC: No history of depression or anxiety.  ENDOCRINOLOGIC: No reports of sweating, cold or heat intolerance. No polyuria or polydipsia.  Marland Kitchen   Physical Examination Today's Vitals    11/02/20 1353  BP: 140/60  Pulse: 88  SpO2: 98%  Weight: 167 lb (75.8 kg)  Height: 5\' 10"  (1.778 m)   Body mass index is 23.96 kg/m.  Gen: resting comfortably, no acute distress HEENT: no scleral icterus, pupils equal round and reactive, no palptable cervical adenopathy,  CV: RRR, no m/r/gl no jvd Resp: Clear to auscultation bilaterally GI: abdomen is soft, non-tender, non-distended, normal bowel sounds, no hepatosplenomegaly MSK: extremities are warm, no edema.  Skin: warm, no rash Neuro:  no focal deficits Psych: appropriate affect   Diagnostic Studies 06/2015 echo Study Conclusions   - Left ventricle: The cavity size was normal. Wall thickness was   increased increased in a pattern of mild to moderate LVH.   Systolic function was normal. The estimated ejection fraction was   in the range of 60% to 65%. Wall motion was normal; there were no   regional wall motion abnormalities. Left ventricular diastolic   function parameters were normal. - Aortic valve: Mildly calcified annulus. Trileaflet; mildly   thickened leaflets. Valve area (VTI): 2.58 cm^2. Valve area   (Vmax): 2.89 cm^2. - Mitral valve: There was mild regurgitation. - Systemic veins: Small IVC, suggesting low RA pressures and   hypovolemia. - Technically adequate study.     06/2015 Exercise Stress MPI Blood pressure demonstrated a hypertensive response to exercise. The study is normal. This is a low risk study. The left ventricular ejection fraction is normal (55-65%).     08/2016 cath Mid RCA lesion, 80 %stenosed. Dist RCA lesion, 100 %stenosed. SVG-mRPDA graft was visualized by angiography and is large and anatomically normal. Potential Culpril (not PCI Target). Ost RPDA lesion, 90 %stenosed, Ost RPDA to RPDA lesion, 70 %stenosed.- filled retrograde from SVG Ost 3rd Mrg to 3rd Mrg lesion, 80 %stenosed. SVG-3rd OM graft was visualized by angiography and is large and anatomically normal. Y Graft SVG-Lat  3rd OM is moderate in size and anatomically normal. Origin lesion, 40 %stenosed. Ost LAD to Prox LAD lesion, 80 %stenosed. Prox LAD to Mid LAD lesion, 99 %stenosed. LIMA graft was visualized by angiography and is normal in caliber and anatomically normal. The left ventricular systolic function is normal. The left ventricular ejection fraction is 55-65% by visual estimate. LV end diastolic pressure is normal.   No obvious culprit lesions found. Clearly has small vessel disease involving the nongrafted diagonal Kimyatta Lecy but this is not a very approachable PCI target. The other potential culprit is the severe disease in the retrograde flow from SVG-RPDA back to the PL system. Is also not PCI target.   Recommend: Optimize medical management      Assessment and Plan   1. CAD - no recent symptoms, continue curren meds   2. HTN - Bradycardia on beta blockers, AKI on ACEI, also avoiding diuretic due to renal dysfunction. Allergies to hydralazine and aldactone.  - bp's aceptable given medication limitations.    3. Hyperlipidemia -back on pravastatin and tolerating for now,  previously had muscle aches  F/u 6 months     Arnoldo Lenis, M.D.

## 2020-11-02 NOTE — Patient Instructions (Signed)
Medication Instructions:  Continue all current medications.   Labwork: none  Testing/Procedures: none  Follow-Up: 6 months   Any Other Special Instructions Will Be Listed Below (If Applicable).   If you need a refill on your cardiac medications before your next appointment, please call your pharmacy.  

## 2020-11-04 DIAGNOSIS — M1711 Unilateral primary osteoarthritis, right knee: Secondary | ICD-10-CM | POA: Diagnosis not present

## 2020-11-07 DIAGNOSIS — H35033 Hypertensive retinopathy, bilateral: Secondary | ICD-10-CM | POA: Diagnosis not present

## 2020-11-11 DIAGNOSIS — M1711 Unilateral primary osteoarthritis, right knee: Secondary | ICD-10-CM | POA: Diagnosis not present

## 2020-11-21 DIAGNOSIS — Z6826 Body mass index (BMI) 26.0-26.9, adult: Secondary | ICD-10-CM | POA: Diagnosis not present

## 2020-11-21 DIAGNOSIS — M1812 Unilateral primary osteoarthritis of first carpometacarpal joint, left hand: Secondary | ICD-10-CM | POA: Diagnosis not present

## 2020-11-21 DIAGNOSIS — G4700 Insomnia, unspecified: Secondary | ICD-10-CM | POA: Diagnosis not present

## 2020-11-21 DIAGNOSIS — E663 Overweight: Secondary | ICD-10-CM | POA: Diagnosis not present

## 2020-12-19 DIAGNOSIS — Z20822 Contact with and (suspected) exposure to covid-19: Secondary | ICD-10-CM | POA: Diagnosis not present

## 2021-01-05 DIAGNOSIS — H25813 Combined forms of age-related cataract, bilateral: Secondary | ICD-10-CM | POA: Diagnosis not present

## 2021-01-12 ENCOUNTER — Other Ambulatory Visit: Payer: Self-pay | Admitting: Cardiology

## 2021-01-26 ENCOUNTER — Ambulatory Visit (INDEPENDENT_AMBULATORY_CARE_PROVIDER_SITE_OTHER): Payer: Medicare Other | Admitting: Physician Assistant

## 2021-01-26 ENCOUNTER — Other Ambulatory Visit: Payer: Self-pay

## 2021-01-26 ENCOUNTER — Encounter: Payer: Self-pay | Admitting: Physician Assistant

## 2021-01-26 DIAGNOSIS — Z85828 Personal history of other malignant neoplasm of skin: Secondary | ICD-10-CM

## 2021-01-26 DIAGNOSIS — D0462 Carcinoma in situ of skin of left upper limb, including shoulder: Secondary | ICD-10-CM | POA: Diagnosis not present

## 2021-01-26 DIAGNOSIS — C44712 Basal cell carcinoma of skin of right lower limb, including hip: Secondary | ICD-10-CM | POA: Diagnosis not present

## 2021-01-26 DIAGNOSIS — D485 Neoplasm of uncertain behavior of skin: Secondary | ICD-10-CM

## 2021-01-26 DIAGNOSIS — D0461 Carcinoma in situ of skin of right upper limb, including shoulder: Secondary | ICD-10-CM | POA: Diagnosis not present

## 2021-01-26 DIAGNOSIS — L570 Actinic keratosis: Secondary | ICD-10-CM

## 2021-01-26 NOTE — Patient Instructions (Signed)

## 2021-01-27 ENCOUNTER — Encounter: Payer: Self-pay | Admitting: Physician Assistant

## 2021-01-27 NOTE — Progress Notes (Addendum)
Follow-Up Visit   Subjective  [name] is a 75 y.o. male who presents for the following: Skin Problem (Patient here today to have a few lesions checked. Patient has a lesion on his right lower leg x few months that's been bleeding. Check lesion on right forearm x few months that's been bleeding and lesion on his left wrist x few months that's been bleeding as well. Personal history of non mole skin cancer. ).   The following portions of the chart were reviewed this encounter and updated as appropriate:  Tobacco  Allergies  Meds  Problems  Med Hx  Surg Hx  Fam Hx      Objective  Well appearing patient in no apparent distress; mood and affect are within normal limits.  A full examination was performed including scalp, head, eyes, ears, nose, lips, neck, chest, axillae, abdomen, back, buttocks, bilateral upper extremities, bilateral lower extremities, hands, feet, fingers, toes, fingernails, and toenails. All findings within normal limits unless otherwise noted below.  Left Lower Leg - Anterior Hyperkeratotic scale with pink base      Right Lower Leg - Anterior Hyperkeratotic scale with pink base      Left Forearm - Posterior Hyperkeratotic scale with pink base      Right Forearm - Posterior Hyperkeratotic scale with pink base      Assessment & Plan  Neoplasm of uncertain behavior of skin Left Lower Leg - Anterior  Skin / nail biopsy Type of biopsy: tangential   Informed consent: discussed and consent obtained   Timeout: patient name, date of birth, surgical site, and procedure verified   Anesthesia: the lesion was anesthetized in a standard fashion   Anesthetic:  1% lidocaine w/ epinephrine 1-100,000 local infiltration Instrument used: flexible razor blade   Hemostasis achieved with: aluminum chloride and electrodesiccation   Outcome: patient tolerated procedure well   Post-procedure details: wound care instructions given    Destruction of  lesion Complexity: simple   Destruction method: electrodesiccation and curettage   Informed consent: discussed and consent obtained   Timeout:  patient name, date of birth, surgical site, and procedure verified Anesthesia: the lesion was anesthetized in a standard fashion   Anesthetic:  1% lidocaine w/ epinephrine 1-100,000 local infiltration Curettage performed in three different directions: Yes   Electrodesiccation performed over the curetted area: Yes   Curettage cycles:  3 Final wound size (cm):  1.3 Hemostasis achieved with:  ferric subsulfate Outcome: patient tolerated procedure well with no complications   Additional details:  Wound innoculated with 5 fluorouracil solution.  Specimen 2 - Surgical pathology Differential Diagnosis: bcc scc tx with bx   Check Margins: No  BCC (basal cell carcinoma), leg, right Right Lower Leg - Anterior  Skin / nail biopsy Type of biopsy: tangential   Informed consent: discussed and consent obtained   Timeout: patient name, date of birth, surgical site, and procedure verified   Anesthesia: the lesion was anesthetized in a standard fashion   Anesthetic:  1% lidocaine w/ epinephrine 1-100,000 local infiltration Instrument used: flexible razor blade   Hemostasis achieved with: aluminum chloride and electrodesiccation   Outcome: patient tolerated procedure well   Post-procedure details: wound care instructions given    Destruction of lesion Complexity: simple   Destruction method: electrodesiccation and curettage   Informed consent: discussed and consent obtained   Timeout:  patient name, date of birth, surgical site, and procedure verified Anesthesia: the lesion was anesthetized in a standard fashion   Anesthetic:  1% lidocaine w/ epinephrine 1-100,000 local infiltration Curettage performed in three different directions: Yes   Electrodesiccation performed over the curetted area: Yes   Curettage cycles:  3 Final wound size (cm):   1.5 Hemostasis achieved with:  ferric subsulfate Outcome: patient tolerated procedure well with no complications   Additional details:  Wound innoculated with 5 fluorouracil solution.  Specimen 1 - Surgical pathology Differential Diagnosis: bcc scc tx with bx  Check Margins: No  Carcinoma in situ of skin of left upper extremity including shoulder Left Forearm - Posterior  Skin / nail biopsy Type of biopsy: tangential   Informed consent: discussed and consent obtained   Timeout: patient name, date of birth, surgical site, and procedure verified   Anesthesia: the lesion was anesthetized in a standard fashion   Anesthetic:  1% lidocaine w/ epinephrine 1-100,000 local infiltration Instrument used: flexible razor blade   Hemostasis achieved with: aluminum chloride and electrodesiccation   Outcome: patient tolerated procedure well   Post-procedure details: wound care instructions given    Destruction of lesion Complexity: simple   Destruction method: electrodesiccation and curettage   Informed consent: discussed and consent obtained   Timeout:  patient name, date of birth, surgical site, and procedure verified Anesthesia: the lesion was anesthetized in a standard fashion   Anesthetic:  1% lidocaine w/ epinephrine 1-100,000 local infiltration Curettage performed in three different directions: Yes   Electrodesiccation performed over the curetted area: Yes   Curettage cycles:  3 Margin per side (cm):  0.1 Final wound size (cm):  1 Hemostasis achieved with:  aluminum chloride Outcome: patient tolerated procedure well with no complications   Post-procedure details: wound care instructions given    Specimen 3 - Surgical pathology Differential Diagnosis: bcc scc tx with bx  Check Margins: No  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 Right Forearm - Posterior  Skin / nail biopsy Type of biopsy: tangential   Informed consent: discussed and consent  obtained   Timeout: patient name, date of birth, surgical site, and procedure verified   Anesthesia: the lesion was anesthetized in a standard fashion   Anesthetic:  1% lidocaine w/ epinephrine 1-100,000 local infiltration Instrument used: flexible razor blade   Hemostasis achieved with: aluminum chloride and electrodesiccation   Outcome: patient tolerated procedure well   Post-procedure details: wound care instructions given    Destruction of lesion Complexity: simple   Destruction method: electrodesiccation and curettage   Informed consent: discussed and consent obtained   Timeout:  patient name, date of birth, surgical site, and procedure verified Anesthesia: the lesion was anesthetized in a standard fashion   Anesthetic:  1% lidocaine w/ epinephrine 1-100,000 local infiltration Curettage performed in three different directions: Yes   Electrodesiccation performed over the curetted area: Yes   Curettage cycles:  3 Final wound size (cm):  2 Hemostasis achieved with:  ferric subsulfate Outcome: patient tolerated procedure well with no complications   Additional details:  Wound innoculated with 5 fluorouracil solution.  Specimen 4 - Surgical pathology Differential Diagnosis: bcc scc tx with bx  Check Margins: No   I, Tawonna Esquer, PA-C, have reviewed all documentation's for this visit.  The documentation on 02/06/21 for the exam, diagnosis, procedures and orders are all accurate and complete.

## 2021-02-16 DIAGNOSIS — H04542 Stenosis of left lacrimal canaliculi: Secondary | ICD-10-CM | POA: Diagnosis not present

## 2021-02-16 DIAGNOSIS — H02215 Cicatricial lagophthalmos left lower eyelid: Secondary | ICD-10-CM | POA: Diagnosis not present

## 2021-02-16 DIAGNOSIS — H02115 Cicatricial ectropion of left lower eyelid: Secondary | ICD-10-CM | POA: Diagnosis not present

## 2021-02-16 DIAGNOSIS — H04522 Eversion of left lacrimal punctum: Secondary | ICD-10-CM | POA: Diagnosis not present

## 2021-02-16 DIAGNOSIS — L905 Scar conditions and fibrosis of skin: Secondary | ICD-10-CM | POA: Diagnosis not present

## 2021-02-16 DIAGNOSIS — H04222 Epiphora due to insufficient drainage, left lacrimal gland: Secondary | ICD-10-CM | POA: Diagnosis not present

## 2021-02-16 DIAGNOSIS — H04122 Dry eye syndrome of left lacrimal gland: Secondary | ICD-10-CM | POA: Diagnosis not present

## 2021-02-16 DIAGNOSIS — H16212 Exposure keratoconjunctivitis, left eye: Secondary | ICD-10-CM | POA: Diagnosis not present

## 2021-02-16 DIAGNOSIS — L718 Other rosacea: Secondary | ICD-10-CM | POA: Diagnosis not present

## 2021-02-21 DIAGNOSIS — I129 Hypertensive chronic kidney disease with stage 1 through stage 4 chronic kidney disease, or unspecified chronic kidney disease: Secondary | ICD-10-CM | POA: Diagnosis not present

## 2021-02-21 DIAGNOSIS — E559 Vitamin D deficiency, unspecified: Secondary | ICD-10-CM | POA: Diagnosis not present

## 2021-02-21 DIAGNOSIS — N1831 Chronic kidney disease, stage 3a: Secondary | ICD-10-CM | POA: Diagnosis not present

## 2021-03-01 DIAGNOSIS — E559 Vitamin D deficiency, unspecified: Secondary | ICD-10-CM | POA: Diagnosis not present

## 2021-03-01 DIAGNOSIS — N1831 Chronic kidney disease, stage 3a: Secondary | ICD-10-CM | POA: Diagnosis not present

## 2021-03-01 DIAGNOSIS — I129 Hypertensive chronic kidney disease with stage 1 through stage 4 chronic kidney disease, or unspecified chronic kidney disease: Secondary | ICD-10-CM | POA: Diagnosis not present

## 2021-03-14 ENCOUNTER — Other Ambulatory Visit: Payer: Medicare Other

## 2021-03-14 ENCOUNTER — Other Ambulatory Visit: Payer: Self-pay

## 2021-03-14 DIAGNOSIS — R972 Elevated prostate specific antigen [PSA]: Secondary | ICD-10-CM | POA: Diagnosis not present

## 2021-03-15 LAB — PSA: Prostate Specific Ag, Serum: 4.3 ng/mL — ABNORMAL HIGH (ref 0.0–4.0)

## 2021-03-17 DIAGNOSIS — H35372 Puckering of macula, left eye: Secondary | ICD-10-CM | POA: Diagnosis not present

## 2021-03-20 NOTE — Progress Notes (Signed)
History of Present Illness:   Here for f/u of elevated PSA.  He had a negative biopsy in Aug 2014. At that time, prostatic volume was 90 mL. He is on finasteride. Since started, PSA has appropriately decreased.    6.2.2020: PSA 2.7 (corrected 5.4)    3.2.2021: PSA 4.8--corrected 9.6     3.8.2022: PSA 5.9 (corrected 11.8) IPSS 4   QoL score 2.  Recheck PSA in June 2021 3.9.  12.6.2022: PSA 4.3.  He is still on tamsulosin as well as finasteride. IPSS 3, quality-of-life score 2.  No significant side effects from these medications.  He has had no recent urinary tract infections.    Past Medical History:  Diagnosis Date   Arteriosclerotic cardiovascular disease (ASCVD)    CABG in 08/1998  (LIMA-mLAD, SVG-rPDA, SVG-OM3-with Y SVG-lateral OM3.)   Atypical nevus 11/29/2005   slight atypia - left hand   Basal cell carcinoma of skin 03/22/2004   Left scapula, superior - CX3 + 5FU   Basal cell carcinoma of skin 03/22/2004   Left scapula, inferior - CX3 + excision   Basal cell carcinoma of skin 03/22/2004   Center mid back - CX3 + 5FU   Basal cell carcinoma of skin 07/24/2004   Left shin - CX3 + 5FU   Basal cell carcinoma of skin 11/29/2005   Left shoulder - CX3 + 5FU   Basal cell carcinoma of skin 10/22/2006   ulcerated on right outer knee - CX3+5FU   Basal cell carcinoma of skin 12/02/2008   left upper arm, superior - CX3+5FU   Basal cell carcinoma of skin 05/16/2011   right shoulder   Basal cell carcinoma of skin 05/16/2011   ulcerated above left sideburn   Basal cell carcinoma of skin 05/16/2011   lateral left arm   Basal cell carcinoma of skin 06/29/2015   nodular on left ear - tx p bx   Basal cell carcinoma of skin 06/08/2016   superficial on left scapha, ear (MOHs)   Basal cell carcinoma of skin 07/25/2017   nodular on left ear (MOHS)   Basal cell carcinoma of skin 07/25/2017   nodular on left thigh - tx p bx   Basal cell carcinoma of skin 11/18/2018   nodular on right  nare - tx p bx   BCC (basal cell carcinoma of skin) 08/25/2019   left upper arm anterior (CX35FU)   BCC (basal cell carcinoma of skin) 08/25/2019   right chin   BPH (benign prostatic hypertrophy)    h/x of prostatis/ UTI in 2006   Chronic kidney disease, stage II (mild) 2010   Creatinine of 1.5 in 2010   GERD (gastroesophageal reflux disease)    s/p espohageal dilatatin and repair of hiatal hernia   Hyperlipidemia    Hypertension    IBS (irritable bowel syndrome)    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   Neck-posterior   PONV (postoperative nausea and vomiting)    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   right buccal cheek (CX35FU)   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   right elbow(CX35FU)   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   Right Forearm Upper(well diff) (tx p bx)   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   Right Index Finger(in situ) (tx p bx)   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   Left Forearm,upper(in situ) (tx p bx)   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/18/2018   Base of Left Middle Finger(in situ) (tx  p bx)   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/18/2018   Right Outer Forearm,sup(in situ) (tx p bx)   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/18/2018   Right Forearm,Inf(in situ) (tx p bx)   Sigmoid diverticulosis    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   in situ on right hand - CX3 + 5FU   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   in situ on right ear - CX3 + 5FU   Squamous cell carcinoma of skin 01/21/2006   in situ on upper mid back - tx p bx   Squamous cell carcinoma of skin 01/21/2006   in situ on top left shoulder - tx p bx   Squamous cell carcinoma of skin 10/22/2006   KA under left eye - MOHs   Squamous cell carcinoma of skin 12/02/2008   in situ on left base of 4th finger - CX3+5FU   Squamous cell carcinoma of skin 08/30/2009   well differentiated on right jawline   Squamous  cell carcinoma of skin 08/30/2009   in situ on left upper arm - CX3+imiquimod   Squamous cell carcinoma of skin 02/25/2014   in situ on left temple   Squamous cell carcinoma of skin 02/25/2014   in situ on left arm   Squamous cell carcinoma of skin 06/08/2016   in situ on left lower side neck - tx p bx   Squamous cell carcinoma of skin 06/08/2016   left shin - tx p bx   Squamous cell carcinoma of skin 06/08/2016   in situ on right temple - tx p bx   Squamous cell carcinoma of skin 06/08/2016   in situ on right jawline - tx p bx    Past Surgical History:  Procedure Laterality Date   BREAST BIOPSY Right 08/17/2013   Procedure: RIGHT BREAST BIOPSY;  Surgeon: [name], MD;  Location: [facility];  Service: General;  Laterality: Right;   CHOLECYSTECTOMY  1990s   COLONOSCOPY  03/17/2012   Rourk; colonic diverticulosis, repeat screening colonoscopy in 10 years.   CORONARY ARTERY BYPASS GRAFT  2000   ESOPHAGOGASTRODUODENOSCOPY  May 2009   Rourk: Food impaction, critical esophageal ring/stricture requiring dilation   HEMORRHOID SURGERY  2007   total of three   [place] CATH AND CORS/GRAFTS ANGIOGRAPHY N/A 08/23/2016   Procedure: Left Heart Cath and Cors/Grafts Angiography;  Surgeon: Leonie Man, MD;  Location: [facility];  Service: Cardiovascular;  Laterality: N/A;   LUMBAR LAMINECTOMY  1999    Home Medications:  Allergies as of 03/21/2021       Reactions   Meperidine Nausea And Vomiting, Nausea Only   Meperidine Hcl Nausea Only   Statins Other (See Comments)   Severe muscle pain   Codeine Nausea And Vomiting   Hydralazine    neuropathy    Nalbuphine Nausea And Vomiting   Spironolactone    MUSCLE PAIN         Medication List        Accurate as of March 20, 2021  7:54 PM. If you have any questions, ask your nurse or doctor.          acetaminophen 325 MG tablet Commonly known as: TYLENOL Take 650 mg by mouth every 6 (six)  hours as needed for moderate pain.   amLODipine 10 MG tablet Commonly known as: NORVASC TAKE 1 TABLET BY MOUTH EVERY DAY   aspirin 81 MG EC  tablet Take 81 mg by mouth at bedtime.   cholecalciferol 25 MCG (1000 UNIT) tablet Commonly known as: VITAMIN D3 Take 1,000 Units by mouth daily.   cyclobenzaprine 10 MG tablet Commonly known as: FLEXERIL Take 10 mg by mouth every 8 (eight) hours as needed.   finasteride 5 MG tablet Commonly known as: PROSCAR Take 1 tablet (5 mg total) by mouth every evening.   furosemide 20 MG tablet Commonly known as: LASIX   isosorbide mononitrate 30 MG 24 hr tablet Commonly known as: IMDUR TAKE 1/2 TABLET BY MOUTH DAILY   neomycin-polymyxin b-dexamethasone 3.5-10000-0.1 Oint Commonly known as: MAXITROL SMARTSIG:Sparingly Left Eye Twice Daily   omeprazole 20 MG capsule Commonly known as: PRILOSEC Take 20 mg by mouth 2 (two) times daily. Before LUNCH and Before SUPPER   pravastatin 20 MG tablet Commonly known as: PRAVACHOL Take 20 mg by mouth daily.   PROBIOTIC DAILY PO Take 1 capsule by mouth daily.   psyllium 58.6 % powder Commonly known as: METAMUCIL Take 1 packet by mouth daily.   tamsulosin 0.4 MG Caps capsule Commonly known as: FLOMAX Take 0.4 mg by mouth at bedtime.   traMADol 50 MG tablet Commonly known as: ULTRAM Take 50 mg by mouth every 6 (six) hours as needed.   zolpidem 10 MG tablet Commonly known as: AMBIEN Take 5 tablets by mouth at bedtime.        Allergies:  Allergies  Allergen Reactions   Meperidine Nausea And Vomiting and Nausea Only   Meperidine Hcl Nausea Only   Statins Other (See Comments)    Severe muscle pain   Codeine Nausea And Vomiting   Hydralazine     neuropathy    Nalbuphine Nausea And Vomiting   Spironolactone     MUSCLE PAIN     Family History  Problem Relation Age of Onset   Coronary artery disease Father        also grandfather   Colon cancer Neg Hx     Social History:   reports that he has never smoked. He has never used smokeless tobacco. He reports that he does not currently use alcohol after a past usage of about 2.0 standard drinks per week. He reports that he does not use drugs.  ROS: A complete review of systems was performed.  All systems are negative except for pertinent findings as noted.  Physical Exam:  Vital signs in last 24 hours: There were no vitals taken for this visit. Constitutional:  Alert and oriented, No acute distress Cardiovascular: Regular rate  Respiratory: Normal respiratory effort Neurologic: Grossly intact, no focal deficits Psychiatric: Normal mood and affect  I have reviewed prior pt notes  I have reviewed urinalysis results  I have reviewed prior PSA results  IPSS reviewed  Pathology reviewed    Impression/Assessment:  1.  History of elevated PSA with negative biopsy in 2014.  Stable PSA with finasteride correction, stable exam  2.  BPH, on finasteride and tamsulosin  Plan:  1.  I will have him try to wean off tamsulosin but stay on finasteride  2.  Drop-in in 6 months for PSA, office visit for PSA/DRE in 1 year

## 2021-03-21 ENCOUNTER — Encounter: Payer: Self-pay | Admitting: Urology

## 2021-03-21 ENCOUNTER — Other Ambulatory Visit: Payer: Self-pay

## 2021-03-21 ENCOUNTER — Ambulatory Visit (INDEPENDENT_AMBULATORY_CARE_PROVIDER_SITE_OTHER): Payer: Medicare Other | Admitting: Urology

## 2021-03-21 VITALS — BP 163/84 | HR 79

## 2021-03-21 DIAGNOSIS — I2510 Atherosclerotic heart disease of native coronary artery without angina pectoris: Secondary | ICD-10-CM | POA: Diagnosis not present

## 2021-03-21 DIAGNOSIS — R972 Elevated prostate specific antigen [PSA]: Secondary | ICD-10-CM

## 2021-03-21 DIAGNOSIS — N401 Enlarged prostate with lower urinary tract symptoms: Secondary | ICD-10-CM

## 2021-03-21 DIAGNOSIS — R350 Frequency of micturition: Secondary | ICD-10-CM | POA: Diagnosis not present

## 2021-03-21 DIAGNOSIS — R351 Nocturia: Secondary | ICD-10-CM | POA: Diagnosis not present

## 2021-03-21 LAB — URINALYSIS, ROUTINE W REFLEX MICROSCOPIC
Bilirubin, UA: NEGATIVE
Glucose, UA: NEGATIVE
Ketones, UA: NEGATIVE
Leukocytes,UA: NEGATIVE
Nitrite, UA: NEGATIVE
Protein,UA: NEGATIVE
RBC, UA: NEGATIVE
Specific Gravity, UA: 1.02 (ref 1.005–1.030)
Urobilinogen, Ur: 0.2 mg/dL (ref 0.2–1.0)
pH, UA: 5.5 (ref 5.0–7.5)

## 2021-03-21 NOTE — Progress Notes (Signed)
Urological Symptom Review  Patient is experiencing the following symptoms: Frequent urination Get up at night to urinate Erection problems (male only)   Review of Systems  Gastrointestinal (upper)  : Negative for upper GI symptoms  Gastrointestinal (lower) : Negative for lower GI symptoms  Constitutional : Negative for symptoms  Skin: Negative for skin symptoms  Eyes: Negative for eye symptoms  Ear/Nose/Throat : Negative for Ear/Nose/Throat symptoms  Hematologic/Lymphatic: Negative for Hematologic/Lymphatic symptoms  Cardiovascular : Negative for cardiovascular symptoms  Respiratory : Negative for respiratory symptoms  Endocrine: Negative for endocrine symptoms  Musculoskeletal: Negative for musculoskeletal symptoms  Neurological: Negative for neurological symptoms  Psychologic: Negative for psychiatric symptoms  

## 2021-03-30 ENCOUNTER — Encounter: Payer: Self-pay | Admitting: Cardiology

## 2021-03-30 DIAGNOSIS — E782 Mixed hyperlipidemia: Secondary | ICD-10-CM | POA: Diagnosis not present

## 2021-03-30 DIAGNOSIS — Z6825 Body mass index (BMI) 25.0-25.9, adult: Secondary | ICD-10-CM | POA: Diagnosis not present

## 2021-03-30 DIAGNOSIS — N3281 Overactive bladder: Secondary | ICD-10-CM | POA: Diagnosis not present

## 2021-03-30 DIAGNOSIS — R7309 Other abnormal glucose: Secondary | ICD-10-CM | POA: Diagnosis not present

## 2021-03-30 DIAGNOSIS — E7849 Other hyperlipidemia: Secondary | ICD-10-CM | POA: Diagnosis not present

## 2021-03-30 DIAGNOSIS — Z0000 Encounter for general adult medical examination without abnormal findings: Secondary | ICD-10-CM | POA: Diagnosis not present

## 2021-03-30 DIAGNOSIS — Z888 Allergy status to other drugs, medicaments and biological substances status: Secondary | ICD-10-CM | POA: Diagnosis not present

## 2021-03-30 DIAGNOSIS — K5732 Diverticulitis of large intestine without perforation or abscess without bleeding: Secondary | ICD-10-CM | POA: Diagnosis not present

## 2021-03-30 DIAGNOSIS — I10 Essential (primary) hypertension: Secondary | ICD-10-CM | POA: Diagnosis not present

## 2021-03-30 DIAGNOSIS — E663 Overweight: Secondary | ICD-10-CM | POA: Diagnosis not present

## 2021-03-30 DIAGNOSIS — I2510 Atherosclerotic heart disease of native coronary artery without angina pectoris: Secondary | ICD-10-CM | POA: Diagnosis not present

## 2021-03-30 DIAGNOSIS — G4700 Insomnia, unspecified: Secondary | ICD-10-CM | POA: Diagnosis not present

## 2021-03-30 DIAGNOSIS — Z1331 Encounter for screening for depression: Secondary | ICD-10-CM | POA: Diagnosis not present

## 2021-05-08 ENCOUNTER — Ambulatory Visit (INDEPENDENT_AMBULATORY_CARE_PROVIDER_SITE_OTHER): Payer: Medicare Other | Admitting: Cardiology

## 2021-05-08 ENCOUNTER — Other Ambulatory Visit: Payer: Self-pay

## 2021-05-08 ENCOUNTER — Encounter: Payer: Self-pay | Admitting: Cardiology

## 2021-05-08 VITALS — BP 148/74 | HR 68 | Ht 70.0 in | Wt 169.0 lb

## 2021-05-08 DIAGNOSIS — I2510 Atherosclerotic heart disease of native coronary artery without angina pectoris: Secondary | ICD-10-CM

## 2021-05-08 DIAGNOSIS — I10 Essential (primary) hypertension: Secondary | ICD-10-CM

## 2021-05-08 DIAGNOSIS — E782 Mixed hyperlipidemia: Secondary | ICD-10-CM | POA: Diagnosis not present

## 2021-05-08 NOTE — Patient Instructions (Addendum)
Follow-Up: °Follow up with Dr. Branch in 6 months. ° °If you need a refill on your cardiac medications before your next appointment, please call your pharmacy. ° °

## 2021-05-08 NOTE — Progress Notes (Signed)
Clinical Summary [name] is a 76 y.o.male seen today for follow up of the following medical problems.    1. CAD - CABG in 2000 - 06/2015 completed echo which showed normal LVEF. Exercise MPI he did 8 min 25 sec with no EKG changes and no ischemia by imaging. - has not been on ACE-I since prior admission with AKI, had also been on NSAIDs at that time.    - admit 08/2016 with NSTEMI, cath as reported below, no interventional targets, managed medically. Imdur added to medical regimen and lopressor 12.5mg  bid added. - lopressor later stopped due to bradycardia    - no recent SOB, no chest pains - compliant with  meds      2. HTN - metoprolol stopped due to low HR's in 09/2017 - prior AKI on ACE-I, though heavy NSAID use at the time also -listed hydralazine, aldactone allergies.      - we previously increased norvasc to 10mg  daily. Reports some prior swelling in the past on norvasc but has tolerated reasonablly well since then - reports high sodium intake around the time of swelling. After about a day the swelling resolved on its own.      -home bp's typically 130s-140s/70s - compliant with meds   3. Hyperlipidemia - leg pains on vytorin. Did not tolerate low dose pravastati initially - pcp increased pravastatin to 40mg  daily.    4. CKD - followed by nephrology by Dr Theador Hawthorne  Last Cr up to 1.3   5. LE edema -imprioved with prn lasix, had recent varicose vein procedure which helped alot   6. Varicose veins - prior intervention   Past Medical History:  Diagnosis Date   Arteriosclerotic cardiovascular disease (ASCVD)    CABG in 08/1998  (LIMA-mLAD, SVG-rPDA, SVG-OM3-with Y SVG-lateral OM3.)   Atypical nevus 11/29/2005   slight atypia - left hand   Basal cell carcinoma of skin 03/22/2004   Left scapula, superior - CX3 + 5FU   Basal cell carcinoma of skin 03/22/2004   Left scapula, inferior - CX3 + excision   Basal cell carcinoma of skin 03/22/2004   Center mid back  - CX3 + 5FU   Basal cell carcinoma of skin 07/24/2004   Left shin - CX3 + 5FU   Basal cell carcinoma of skin 11/29/2005   Left shoulder - CX3 + 5FU   Basal cell carcinoma of skin 10/22/2006   ulcerated on right outer knee - CX3+5FU   Basal cell carcinoma of skin 12/02/2008   left upper arm, superior - CX3+5FU   Basal cell carcinoma of skin 05/16/2011   right shoulder   Basal cell carcinoma of skin 05/16/2011   ulcerated above left sideburn   Basal cell carcinoma of skin 05/16/2011   lateral left arm   Basal cell carcinoma of skin 06/29/2015   nodular on left ear - tx p bx   Basal cell carcinoma of skin 06/08/2016   superficial on left scapha, ear (MOHs)   Basal cell carcinoma of skin 07/25/2017   nodular on left ear (MOHS)   Basal cell carcinoma of skin 07/25/2017   nodular on left thigh - tx p bx   Basal cell carcinoma of skin 11/18/2018   nodular on right nare - tx p bx   BCC (basal cell carcinoma of skin) 08/25/2019   left upper arm anterior (CX35FU)   BCC (basal cell carcinoma of skin) 08/25/2019   right chin   BPH (benign prostatic hypertrophy)  h/x of prostatis/ UTI in 2006   Chronic kidney disease, stage II (mild) 2010   Creatinine of 1.5 in 2010   GERD (gastroesophageal reflux disease)    s/p espohageal dilatatin and repair of hiatal hernia   Hyperlipidemia    Hypertension    IBS (irritable bowel syndrome)    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   Neck-posterior   PONV (postoperative nausea and vomiting)    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   right buccal cheek (CX35FU)   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   right elbow(CX35FU)   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   Right Forearm Upper(well diff) (tx p bx)   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   Right Index Finger(in situ) (tx p bx)   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   Left Forearm,upper(in situ) (tx p bx)   SCCA (squamous cell carcinoma)  of skin 11/18/2018   Base of Left Middle Finger(in situ) (tx p bx)   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/18/2018   Right Outer Forearm,sup(in situ) (tx p bx)   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/18/2018   Right Forearm,Inf(in situ) (tx p bx)   Sigmoid diverticulosis    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   in situ on right hand - CX3 + 5FU   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   in situ on right ear - CX3 + 5FU   Squamous cell carcinoma of skin 01/21/2006   in situ on upper mid back - tx p bx   Squamous cell carcinoma of skin 01/21/2006   in situ on top left shoulder - tx p bx   Squamous cell carcinoma of skin 10/22/2006   KA under left eye - MOHs   Squamous cell carcinoma of skin 12/02/2008   in situ on left base of 4th finger - CX3+5FU   Squamous cell carcinoma of skin 08/30/2009   well differentiated on right jawline   Squamous cell carcinoma of skin 08/30/2009   in situ on left upper arm - CX3+imiquimod   Squamous cell carcinoma of skin 02/25/2014   in situ on left temple   Squamous cell carcinoma of skin 02/25/2014   in situ on left arm   Squamous cell carcinoma of skin 06/08/2016   in situ on left lower side neck - tx p bx   Squamous cell carcinoma of skin 06/08/2016   left shin - tx p bx   Squamous cell carcinoma of skin 06/08/2016   in situ on right temple - tx p bx   Squamous cell carcinoma of skin 06/08/2016   in situ on right jawline - tx p bx     Allergies  Allergen Reactions   Meperidine Nausea And Vomiting and Nausea Only   Meperidine Hcl Nausea Only   Statins Other (See Comments)    Severe muscle pain   Codeine Nausea And Vomiting   Hydralazine     neuropathy    Nalbuphine Nausea And Vomiting   Spironolactone     MUSCLE PAIN      Current Outpatient Medications  Medication Sig Dispense Refill   acetaminophen (TYLENOL) 325 MG tablet Take 650 mg by mouth every 6 (six) hours as needed for moderate pain.     amLODipine  (NORVASC) 10 MG tablet TAKE 1 TABLET BY MOUTH EVERY DAY 90 tablet 2   aspirin 81 MG EC tablet Take 81 mg by mouth at bedtime.  cholecalciferol (VITAMIN D3) 25 MCG (1000 UNIT) tablet Take 1,000 Units by mouth daily.     cyclobenzaprine (FLEXERIL) 10 MG tablet Take 10 mg by mouth every 8 (eight) hours as needed.     finasteride (PROSCAR) 5 MG tablet Take 1 tablet (5 mg total) by mouth every evening. 30 tablet 5   furosemide (LASIX) 20 MG tablet      isosorbide mononitrate (IMDUR) 30 MG 24 hr tablet TAKE 1/2 TABLET BY MOUTH DAILY 45 tablet 2   neomycin-polymyxin b-dexamethasone (MAXITROL) 3.5-10000-0.1 OINT SMARTSIG:Sparingly Left Eye Twice Daily     omeprazole (PRILOSEC) 20 MG capsule Take 20 mg by mouth 2 (two) times daily. Before LUNCH and Before SUPPER     pravastatin (PRAVACHOL) 20 MG tablet Take 20 mg by mouth daily.     Probiotic Product (PROBIOTIC DAILY PO) Take 1 capsule by mouth daily.      psyllium (METAMUCIL) 58.6 % powder Take 1 packet by mouth daily.     tamsulosin (FLOMAX) 0.4 MG CAPS capsule Take 0.4 mg by mouth at bedtime.     traMADol (ULTRAM) 50 MG tablet Take 50 mg by mouth every 6 (six) hours as needed.     zolpidem (AMBIEN) 10 MG tablet Take 5 tablets by mouth at bedtime.     No current facility-administered medications for this visit.     Past Surgical History:  Procedure Laterality Date   BREAST BIOPSY Right 08/17/2013   Procedure: RIGHT BREAST BIOPSY;  Surgeon: [name], MD;  Location: [facility];  Service: General;  Laterality: Right;   CHOLECYSTECTOMY  1990s   COLONOSCOPY  03/17/2012   Rourk; colonic diverticulosis, repeat screening colonoscopy in 10 years.   CORONARY ARTERY BYPASS GRAFT  2000   ESOPHAGOGASTRODUODENOSCOPY  May 2009   Rourk: Food impaction, critical esophageal ring/stricture requiring dilation   HEMORRHOID SURGERY  2007   total of three   [place] CATH AND CORS/GRAFTS ANGIOGRAPHY N/A 08/23/2016   Procedure: Left  Heart Cath and Cors/Grafts Angiography;  Surgeon: Leonie Man, MD;  Location: [facility];  Service: Cardiovascular;  Laterality: N/A;   LUMBAR LAMINECTOMY  1999     Allergies  Allergen Reactions   Meperidine Nausea And Vomiting and Nausea Only   Meperidine Hcl Nausea Only   Statins Other (See Comments)    Severe muscle pain   Codeine Nausea And Vomiting   Hydralazine     neuropathy    Nalbuphine Nausea And Vomiting   Spironolactone     MUSCLE PAIN       Family History  Problem Relation Age of Onset   Coronary artery disease Father        also grandfather   Colon cancer Neg Hx      Social History [name] reports that he has never smoked. He has never used smokeless tobacco. [name] reports that he does not currently use alcohol after a past usage of about 2.0 standard drinks per week.   Review of Systems CONSTITUTIONAL: No weight loss, fever, chills, weakness or fatigue.  HEENT: Eyes: No visual loss, blurred vision, double vision or yellow sclerae.No hearing loss, sneezing, congestion, runny nose or sore throat.  SKIN: No rash or itching.  CARDIOVASCULAR: per hpi RESPIRATORY: No shortness of breath, cough or sputum.  GASTROINTESTINAL: No anorexia, nausea, vomiting or diarrhea. No abdominal pain or blood.  GENITOURINARY: No burning on urination, no polyuria NEUROLOGICAL: No headache, dizziness, syncope, paralysis, ataxia,  numbness or tingling in the extremities. No change in bowel or bladder control.  MUSCULOSKELETAL: No muscle, back pain, joint pain or stiffness.  LYMPHATICS: No enlarged nodes. No history of splenectomy.  PSYCHIATRIC: No history of depression or anxiety.  ENDOCRINOLOGIC: No reports of sweating, cold or heat intolerance. No polyuria or polydipsia.  Marland Kitchen   Physical Examination Today's Vitals   05/08/21 1254  BP: (!) 148/74  Pulse: 68  SpO2: 98%  Weight: 169 lb (76.7 kg)  Height: 5\' 10"  (1.778 m)   Body mass index is 24.25  kg/m.  Gen: resting comfortably, no acute distress HEENT: no scleral icterus, pupils equal round and reactive, no palptable cervical adenopathy,  CV: RRR, no mr/g no jvd Resp: Clear to auscultation bilaterally GI: abdomen is soft, non-tender, non-distended, normal bowel sounds, no hepatosplenomegaly MSK: extremities are warm, no edema.  Skin: warm, no rash Neuro:  no focal deficits Psych: appropriate affect   Diagnostic Studies 06/2015 echo Study Conclusions   - Left ventricle: The cavity size was normal. Wall thickness was   increased increased in a pattern of mild to moderate LVH.   Systolic function was normal. The estimated ejection fraction was   in the range of 60% to 65%. Wall motion was normal; there were no   regional wall motion abnormalities. Left ventricular diastolic   function parameters were normal. - Aortic valve: Mildly calcified annulus. Trileaflet; mildly   thickened leaflets. Valve area (VTI): 2.58 cm^2. Valve area   (Vmax): 2.89 cm^2. - Mitral valve: There was mild regurgitation. - Systemic veins: Small IVC, suggesting low RA pressures and   hypovolemia. - Technically adequate study.     06/2015 Exercise Stress MPI Blood pressure demonstrated a hypertensive response to exercise. The study is normal. This is a low risk study. The left ventricular ejection fraction is normal (55-65%).     08/2016 cath Mid RCA lesion, 80 %stenosed. Dist RCA lesion, 100 %stenosed. SVG-mRPDA graft was visualized by angiography and is large and anatomically normal. Potential Culpril (not PCI Target). Ost RPDA lesion, 90 %stenosed, Ost RPDA to RPDA lesion, 70 %stenosed.- filled retrograde from SVG Ost 3rd Mrg to 3rd Mrg lesion, 80 %stenosed. SVG-3rd OM graft was visualized by angiography and is large and anatomically normal. Y Graft SVG-Lat 3rd OM is moderate in size and anatomically normal. Origin lesion, 40 %stenosed. Ost LAD to Prox LAD lesion, 80 %stenosed. Prox LAD to  Mid LAD lesion, 99 %stenosed. LIMA graft was visualized by angiography and is normal in caliber and anatomically normal. The left ventricular systolic function is normal. The left ventricular ejection fraction is 55-65% by visual estimate. LV end diastolic pressure is normal.   No obvious culprit lesions found. Clearly has small vessel disease involving the nongrafted diagonal [name] but this is not a very approachable PCI target. The other potential culprit is the severe disease in the retrograde flow from SVG-RPDA back to the PL system. Is also not PCI target.   Recommend: Optimize medical management      Assessment and Plan  1. CAD - denies any recent symptoms, continue curernt meds   2. HTN - Bradycardia on beta blockers, AKI on ACEI, also avoiding diuretic due to renal dysfunction. Allergies to hydralazine and aldactone. He is on flomax (alpha 1 blocker), could consider talking with urology if could change to doxazosin for more bp effect if needed in the future. . Could consider clonidine.  - overall home SBPs 130s-140s, given medication limitations I think reasonable control at  this time.    3. Hyperlipidemia -back on pravastatin and tolerating for now, pcp recently increased - request labs fromc pcp, continue current meds   EKG shows SR, occasional PACs      Arnoldo Lenis, M.D.

## 2021-05-29 ENCOUNTER — Other Ambulatory Visit: Payer: Self-pay | Admitting: Cardiology

## 2021-06-07 DIAGNOSIS — H16212 Exposure keratoconjunctivitis, left eye: Secondary | ICD-10-CM | POA: Diagnosis not present

## 2021-06-07 DIAGNOSIS — L905 Scar conditions and fibrosis of skin: Secondary | ICD-10-CM | POA: Diagnosis not present

## 2021-06-07 DIAGNOSIS — H02535 Eyelid retraction left lower eyelid: Secondary | ICD-10-CM | POA: Diagnosis not present

## 2021-06-07 DIAGNOSIS — H02115 Cicatricial ectropion of left lower eyelid: Secondary | ICD-10-CM | POA: Diagnosis not present

## 2021-06-07 DIAGNOSIS — H02215 Cicatricial lagophthalmos left lower eyelid: Secondary | ICD-10-CM | POA: Diagnosis not present

## 2021-06-07 DIAGNOSIS — H04522 Eversion of left lacrimal punctum: Secondary | ICD-10-CM | POA: Diagnosis not present

## 2021-06-07 DIAGNOSIS — H04542 Stenosis of left lacrimal canaliculi: Secondary | ICD-10-CM | POA: Diagnosis not present

## 2021-06-07 DIAGNOSIS — H04562 Stenosis of left lacrimal punctum: Secondary | ICD-10-CM | POA: Diagnosis not present

## 2021-06-07 DIAGNOSIS — H04222 Epiphora due to insufficient drainage, left lacrimal gland: Secondary | ICD-10-CM | POA: Diagnosis not present

## 2021-06-19 ENCOUNTER — Encounter (INDEPENDENT_AMBULATORY_CARE_PROVIDER_SITE_OTHER): Payer: Medicare Other | Admitting: Ophthalmology

## 2021-06-19 ENCOUNTER — Other Ambulatory Visit: Payer: Self-pay

## 2021-06-19 DIAGNOSIS — H33301 Unspecified retinal break, right eye: Secondary | ICD-10-CM | POA: Diagnosis not present

## 2021-06-19 DIAGNOSIS — H43813 Vitreous degeneration, bilateral: Secondary | ICD-10-CM

## 2021-06-19 DIAGNOSIS — H35033 Hypertensive retinopathy, bilateral: Secondary | ICD-10-CM

## 2021-06-19 DIAGNOSIS — H35342 Macular cyst, hole, or pseudohole, left eye: Secondary | ICD-10-CM | POA: Diagnosis not present

## 2021-06-19 DIAGNOSIS — I10 Essential (primary) hypertension: Secondary | ICD-10-CM

## 2021-06-21 DIAGNOSIS — L905 Scar conditions and fibrosis of skin: Secondary | ICD-10-CM | POA: Diagnosis not present

## 2021-06-21 DIAGNOSIS — Z09 Encounter for follow-up examination after completed treatment for conditions other than malignant neoplasm: Secondary | ICD-10-CM | POA: Diagnosis not present

## 2021-06-28 DIAGNOSIS — Z20828 Contact with and (suspected) exposure to other viral communicable diseases: Secondary | ICD-10-CM | POA: Diagnosis not present

## 2021-07-03 ENCOUNTER — Encounter (INDEPENDENT_AMBULATORY_CARE_PROVIDER_SITE_OTHER): Payer: Medicare Other | Admitting: Ophthalmology

## 2021-07-03 ENCOUNTER — Other Ambulatory Visit: Payer: Self-pay

## 2021-07-03 DIAGNOSIS — H35342 Macular cyst, hole, or pseudohole, left eye: Secondary | ICD-10-CM

## 2021-07-03 DIAGNOSIS — H2513 Age-related nuclear cataract, bilateral: Secondary | ICD-10-CM | POA: Diagnosis not present

## 2021-07-03 DIAGNOSIS — H35033 Hypertensive retinopathy, bilateral: Secondary | ICD-10-CM

## 2021-07-03 DIAGNOSIS — H43813 Vitreous degeneration, bilateral: Secondary | ICD-10-CM

## 2021-07-03 DIAGNOSIS — I10 Essential (primary) hypertension: Secondary | ICD-10-CM | POA: Diagnosis not present

## 2021-07-03 DIAGNOSIS — H33301 Unspecified retinal break, right eye: Secondary | ICD-10-CM | POA: Diagnosis not present

## 2021-07-06 DIAGNOSIS — Z09 Encounter for follow-up examination after completed treatment for conditions other than malignant neoplasm: Secondary | ICD-10-CM | POA: Diagnosis not present

## 2021-07-06 DIAGNOSIS — L905 Scar conditions and fibrosis of skin: Secondary | ICD-10-CM | POA: Diagnosis not present

## 2021-07-11 DIAGNOSIS — Z20822 Contact with and (suspected) exposure to covid-19: Secondary | ICD-10-CM | POA: Diagnosis not present

## 2021-07-27 ENCOUNTER — Ambulatory Visit (INDEPENDENT_AMBULATORY_CARE_PROVIDER_SITE_OTHER): Payer: Medicare Other | Admitting: Physician Assistant

## 2021-07-27 ENCOUNTER — Encounter: Payer: Self-pay | Admitting: Physician Assistant

## 2021-07-27 DIAGNOSIS — D485 Neoplasm of uncertain behavior of skin: Secondary | ICD-10-CM

## 2021-07-27 DIAGNOSIS — Z85828 Personal history of other malignant neoplasm of skin: Secondary | ICD-10-CM

## 2021-07-27 DIAGNOSIS — D0439 Carcinoma in situ of skin of other parts of face: Secondary | ICD-10-CM | POA: Diagnosis not present

## 2021-07-27 DIAGNOSIS — D0430 Carcinoma in situ of skin of unspecified part of face: Secondary | ICD-10-CM

## 2021-07-27 DIAGNOSIS — L820 Inflamed seborrheic keratosis: Secondary | ICD-10-CM | POA: Diagnosis not present

## 2021-07-27 DIAGNOSIS — I2510 Atherosclerotic heart disease of native coronary artery without angina pectoris: Secondary | ICD-10-CM | POA: Diagnosis not present

## 2021-07-27 DIAGNOSIS — Z1283 Encounter for screening for malignant neoplasm of skin: Secondary | ICD-10-CM

## 2021-07-27 DIAGNOSIS — C44320 Squamous cell carcinoma of skin of unspecified parts of face: Secondary | ICD-10-CM

## 2021-07-27 DIAGNOSIS — Z86018 Personal history of other benign neoplasm: Secondary | ICD-10-CM | POA: Diagnosis not present

## 2021-07-27 DIAGNOSIS — C44329 Squamous cell carcinoma of skin of other parts of face: Secondary | ICD-10-CM | POA: Diagnosis not present

## 2021-07-27 DIAGNOSIS — C44612 Basal cell carcinoma of skin of right upper limb, including shoulder: Secondary | ICD-10-CM

## 2021-07-27 NOTE — Progress Notes (Signed)
? ?Follow-Up Visit ?  ?Subjective  ?[name] is a 76 y.o. male who presents for the following: Follow-up (New lesions at 6 month check Right upper cheek non healing cut when shaving, superior sideburn non healing and tip of right shoulder. Personal history of bcc scc and atypia). ? ? ?The following portions of the chart were reviewed this encounter and updated as appropriate:  Tobacco  Allergies  Meds  Problems  Med Hx  Surg Hx  Fam Hx   ?  ? ?Objective  ?Well appearing patient in no apparent distress; mood and affect are within normal limits. ? ?A full examination was performed including scalp, head, eyes, ears, nose, lips, neck, chest, axillae, abdomen, back, buttocks, bilateral upper extremities, bilateral lower extremities, hands, feet, fingers, toes, fingernails, and toenails. All findings within normal limits unless otherwise noted below. ? ?Right Temple ?Hyperkeratotic scale with pink base  ? ? ? ? ?Left Posterior Mandible ?Hyperkeratotic scale with pink base  ? ? ? ? ?right sup cheek ?Hyperkeratotic scale with pink base  ? ? ? ? ?Right Shoulder - Anterior ?Hyperkeratotic scale with pink base  ? ? ? ? ? ?Assessment & Plan  ?Neoplasm of uncertain behavior of skin ?Right Temple ? ?Skin / nail biopsy ?Type of biopsy: tangential   ?Informed consent: discussed and consent obtained   ?Timeout: patient name, date of birth, surgical site, and procedure verified   ?Anesthesia: the lesion was anesthetized in a standard fashion   ?Anesthetic:  1% lidocaine w/ epinephrine 1-100,000 local infiltration ?Instrument used: flexible razor blade   ?Hemostasis achieved with: aluminum chloride and electrodesiccation   ?Outcome: patient tolerated procedure well   ?Post-procedure details: wound care instructions given   ? ?Destruction of lesion ?Complexity: simple   ?Destruction method: electrodesiccation and curettage   ?Informed consent: discussed and consent obtained   ?Timeout:  patient name, date of birth,  surgical site, and procedure verified ?Anesthesia: the lesion was anesthetized in a standard fashion   ?Anesthetic:  1% lidocaine w/ epinephrine 1-100,000 local infiltration ?Curettage performed in three different directions: Yes   ?Electrodesiccation performed over the curetted area: Yes   ?Curettage cycles:  3 ?Margin per side (cm):  0.1 ?Final wound size (cm):  1.5 ?Hemostasis achieved with:  aluminum chloride ?Outcome: patient tolerated procedure well with no complications   ?Post-procedure details: wound care instructions given   ? ?Specimen 3 - Surgical pathology ?Differential Diagnosis: bcc scc tx with bx ? ?Check Margins: No ? ?Carcinoma in situ of skin of face, unspecified location ?Left Posterior Mandible ? ?Skin / nail biopsy ?Type of biopsy: tangential   ?Informed consent: discussed and consent obtained   ?Timeout: patient name, date of birth, surgical site, and procedure verified   ?Anesthesia: the lesion was anesthetized in a standard fashion   ?Anesthetic:  1% lidocaine w/ epinephrine 1-100,000 local infiltration ?Instrument used: flexible razor blade   ?Hemostasis achieved with: aluminum chloride and electrodesiccation   ?Outcome: patient tolerated procedure well   ?Post-procedure details: wound care instructions given   ? ?Destruction of lesion ?Complexity: simple   ?Destruction method: electrodesiccation and curettage   ?Informed consent: discussed and consent obtained   ?Timeout:  patient name, date of birth, surgical site, and procedure verified ?Anesthesia: the lesion was anesthetized in a standard fashion   ?Anesthetic:  1% lidocaine w/ epinephrine 1-100,000 local infiltration ?Curettage performed in three different directions: Yes   ?Electrodesiccation performed over the curetted area: Yes   ?Curettage cycles:  3 ?Margin  per side (cm):  0.1 ?Final wound size (cm):  2 ?Hemostasis achieved with:  aluminum chloride ?Outcome: patient tolerated procedure well with no complications   ?Post-procedure  details: wound care instructions given   ? ?Specimen 2 - Surgical pathology ?Differential Diagnosis: bcc scc tx with bx ? ?Check Margins: No ? 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 ?Skin / nail biopsy ?Type of biopsy: tangential   ?Informed consent: discussed and consent obtained   ?Timeout: patient name, date of birth, surgical site, and procedure verified   ?Procedure prep:  Patient was prepped and draped in usual sterile fashion (Non sterile) ?Prep type:  Chlorhexidine ?Anesthesia: the lesion was anesthetized in a standard fashion   ?Anesthetic:  1% lidocaine w/ epinephrine 1-100,000 local infiltration ?Instrument used: flexible razor blade   ?Outcome: patient tolerated procedure well   ?Post-procedure details: wound care instructions given   ? ?Destruction of lesion ?Complexity: simple   ?Destruction method: electrodesiccation and curettage   ?Informed consent: discussed and consent obtained   ?Timeout:  patient name, date of birth, surgical site, and procedure verified ?Anesthesia: the lesion was anesthetized in a standard fashion   ?Anesthetic:  1% lidocaine w/ epinephrine 1-100,000 local infiltration ?Curettage performed in three different directions: Yes   ?Electrodesiccation performed over the curetted area: Yes   ?Curettage cycles:  3 ?Margin per side (cm):  0.1 ?Final wound size (cm):  2 ?Hemostasis achieved with:  aluminum chloride ?Outcome: patient tolerated procedure well with no complications   ?Post-procedure details: wound care instructions given   ? ?Specimen 4 - Surgical pathology ?Differential Diagnosis: bcc scc tx with bx  ? ?Check Margins: No ? ?Basal cell carcinoma (BCC) of skin of right upper extremity including shoulder ?Right Shoulder - Anterior ? ?Skin / nail biopsy ?Type of biopsy: tangential   ?Informed consent: discussed and consent obtained   ?Timeout: patient name, date of birth, surgical site, and procedure verified   ?Anesthesia: the lesion was anesthetized in a  standard fashion   ?Anesthetic:  1% lidocaine w/ epinephrine 1-100,000 local infiltration ?Instrument used: flexible razor blade   ?Hemostasis achieved with: aluminum chloride and electrodesiccation   ?Outcome: patient tolerated procedure well   ?Post-procedure details: wound care instructions given   ? ?Destruction of lesion ?Complexity: simple   ?Destruction method: electrodesiccation and curettage   ?Informed consent: discussed and consent obtained   ?Timeout:  patient name, date of birth, surgical site, and procedure verified ?Anesthesia: the lesion was anesthetized in a standard fashion   ?Anesthetic:  1% lidocaine w/ epinephrine 1-100,000 local infiltration ?Curettage performed in three different directions: Yes   ?Electrodesiccation performed over the curetted area: Yes   ?Curettage cycles:  3 ?Margin per side (cm):  0.1 ?Final wound size (cm):  2 ?Hemostasis achieved with:  aluminum chloride ?Outcome: patient tolerated procedure well with no complications   ?Post-procedure details: wound care instructions given   ? ?Specimen 1 - Surgical pathology ?Differential Diagnosis: bcc scc tx with bx ? ?Check Margins: No ? ? ? ?I, Gyan Cambre, PA-C, have reviewed all documentation's for this visit.  The documentation on 08/03/21 for the exam, diagnosis, procedures and orders are all accurate and complete. ?

## 2021-07-27 NOTE — Patient Instructions (Signed)

## 2021-08-03 DIAGNOSIS — M1711 Unilateral primary osteoarthritis, right knee: Secondary | ICD-10-CM | POA: Diagnosis not present

## 2021-08-07 ENCOUNTER — Telehealth: Payer: Self-pay | Admitting: *Deleted

## 2021-08-07 NOTE — Telephone Encounter (Signed)
-----   Message from Warren Danes, [place] sent at 08/03/2021  3:39 PM EDT ----- ?Tx w bx. ?

## 2021-08-07 NOTE — Telephone Encounter (Signed)
Left patient a message to call back for results.  ?

## 2021-08-07 NOTE — Telephone Encounter (Signed)
Path to patient. No follow up at this time. Patients lesions are healing well.  ?

## 2021-08-21 DIAGNOSIS — Z20822 Contact with and (suspected) exposure to covid-19: Secondary | ICD-10-CM | POA: Diagnosis not present

## 2021-08-27 IMAGING — MR MR PROSTATE WO/W CM
12 series · 48 of 48 positions shown · IV contrast (multihance)
Comparison: None.

CLINICAL DATA: Elevated PSA.

EXAM:
MR PROSTATE WITHOUT AND WITH CONTRAST
TECHNIQUE: Multiplanar multisequence MRI images were obtained of the pelvis
centered about the prostate. Pre and post contrast images were
obtained.
CONTRAST:  16mL MULTIHANCE GADOBENATE DIMEGLUMINE 529 MG/ML IV SOLN

[Series 3: T2 · coronal · 3.0mm · 0.56mm/px · 1 of 23 slices shown (1 of 3)]
[im 1/23]
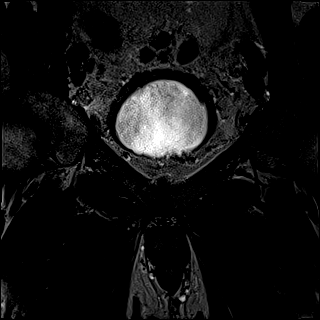

[Series 4: T1 · axial · 5.0mm · 1.25mm/px · z∈[-25,+210]mm · 2 of 96 slices shown]
[im 1/96]
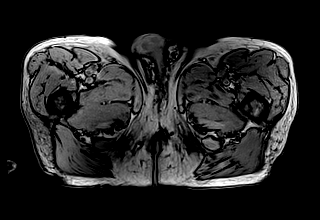
[im 96/96]
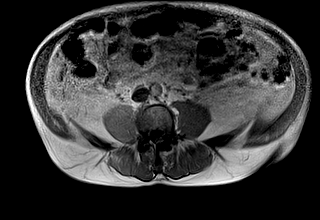

[Series 5: DWI · axial · 3.0mm · 1.75mm/px · z∈[+15,+87]mm · 2 of 75 slices shown (1 of 3)]
[im 1/75]
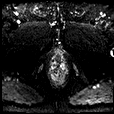
[im 75/75]
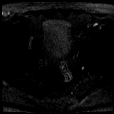

[Series 6: DWI · axial · 3.0mm · 1.75mm/px · 1 of 25 slices shown (2 of 3)]
[im 1/25]
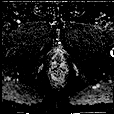

[Series 7: DWI · axial · 3.0mm · 1.75mm/px · 1 of 25 slices shown (3 of 3)]
[im 1/25]
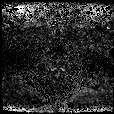

[Series 8: T2 · axial · 3.0mm · 0.56mm/px · 1 of 25 slices shown (2 of 3)]
[im 1/25]
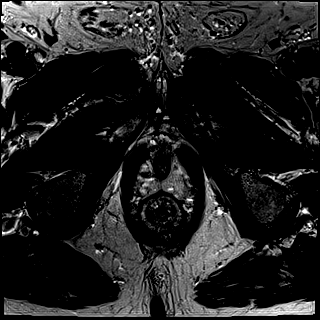

[Series 9: T2 · axial · 1.0mm · 1.04mm/px · z∈[+11,+90]mm · 2 of 80 slices shown (3 of 3)]
[im 1/80]
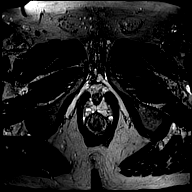
[im 80/80]
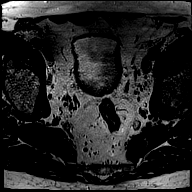

[Series 10: pre t1_twist_tra_dyn · axial · non-contrast · 3.5mm · 0.83mm/px · 1 of 20 slices shown]
[im 1/20]
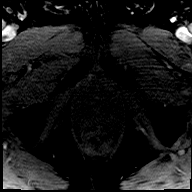

[Series 11: post t1_twist_tra_dyn-copy center · axial · non-contrast · 3.5mm · 0.83mm/px · z∈[+17,+84]mm · 17 of 600 slices shown]
[im 1/600]
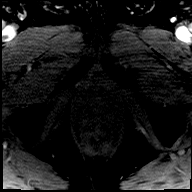
[im 38/600]
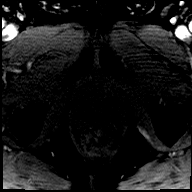
[im 75/600]
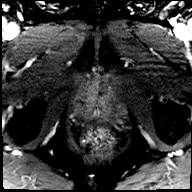
[im 113/600]
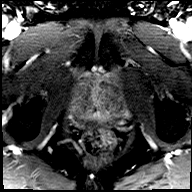
[im 150/600]
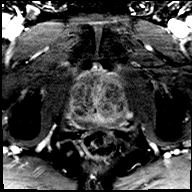
[im 188/600]
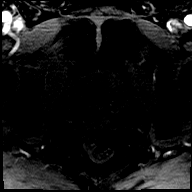
[im 225/600]
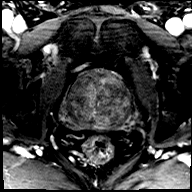
[im 263/600]
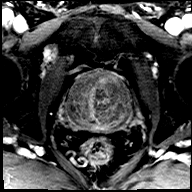
[im 300/600]
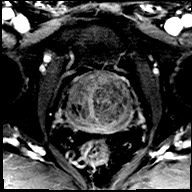
[im 337/600]
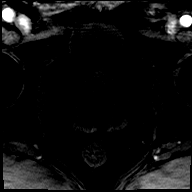
[im 375/600]
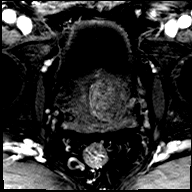
[im 412/600]
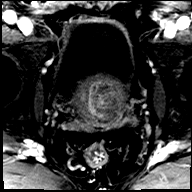
[im 450/600]
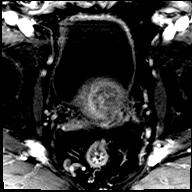
[im 487/600]
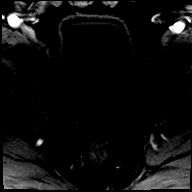
[im 525/600]
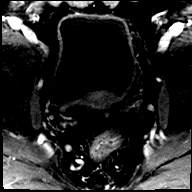
[im 562/600]
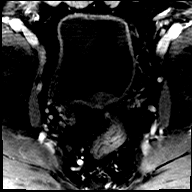
[im 600/600]
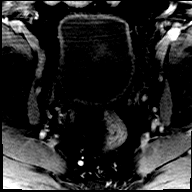

[Series 12: post t1_twist_tra_dyn-copy cent_sub · axial · 3.5mm · 0.83mm/px · z∈[+17,+84]mm · 16 of 580 slices shown]
[im 1/580]
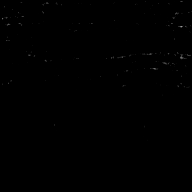
[im 39/580]
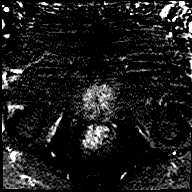
[im 78/580]
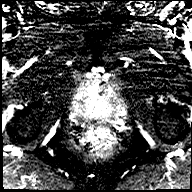
[im 116/580]
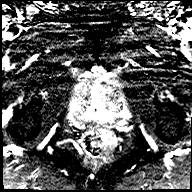
[im 155/580]
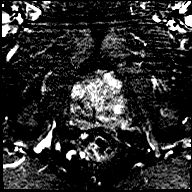
[im 194/580]
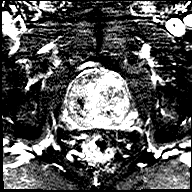
[im 232/580]
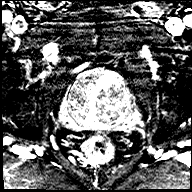
[im 271/580]
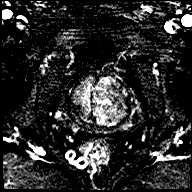
[im 309/580]
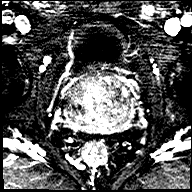
[im 348/580]
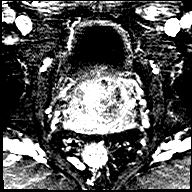
[im 387/580]
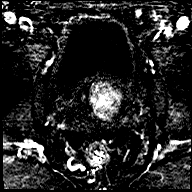
[im 425/580]
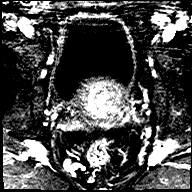
[im 464/580]
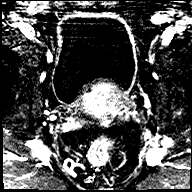
[im 502/580]
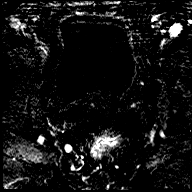
[im 541/580]
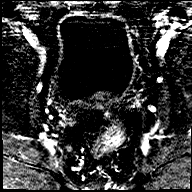
[im 580/580]
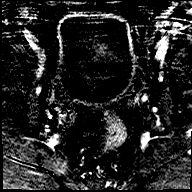

[Series 13: t1_vibe_dixon_tra_f · axial · 2.5mm · 1.14mm/px · z∈[-17,+201]mm · 2 of 88 slices shown]
[im 1/88]
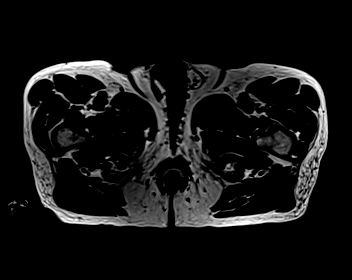
[im 88/88]
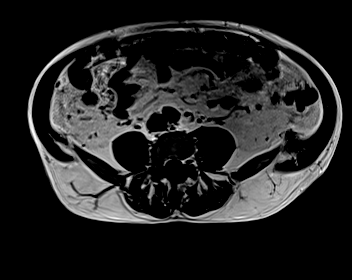

[Series 14: t1_vibe_dixon_tra_w · axial · 2.5mm · 1.14mm/px · z∈[-17,+201]mm · 2 of 88 slices shown]
[im 1/88]
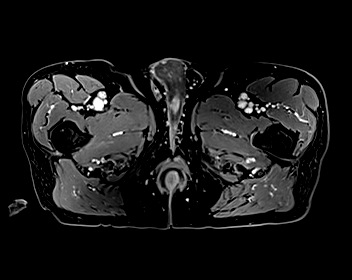
[im 88/88]
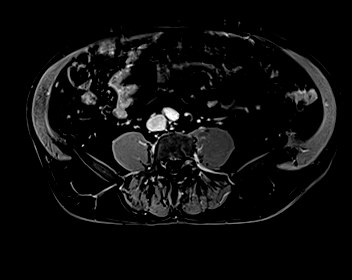

[48 of 48 positions shown; findings below may reference images not displayed]

FINDINGS: Prostate:

-- Peripheral Zone: Diffuse thinning noted due to central gland
enlargement. No focal lesion seen on ADC or high b-value DWI
sequences.

-- Transition/Central Zone: Moderately enlarged with multiple
circumscribed BPH nodules noted; however, no suspicious nodules with
obscured margins or restricted diffusion seen.

-- Measurements/Volume:  6.7 x 5.1 x 5.5 cm (volume = 98 cm^3)

Transcapsular spread:  Absent

Seminal vesicle involvement:  Absent

Neurovascular bundle involvement:  Absent

Pelvic adenopathy: None visualized

Bone metastasis: None visualized

Other: Severe sigmoid diverticulosis, without evidence of
diverticulitis.
IMPRESSION: No radiographic evidence of high-grade prostate carcinoma. PI-RADS
1: Very Low (clinically significant cancer is highly unlikely to be
present)

## 2021-08-31 DIAGNOSIS — N1831 Chronic kidney disease, stage 3a: Secondary | ICD-10-CM | POA: Diagnosis not present

## 2021-08-31 DIAGNOSIS — N1832 Chronic kidney disease, stage 3b: Secondary | ICD-10-CM | POA: Diagnosis not present

## 2021-08-31 DIAGNOSIS — E559 Vitamin D deficiency, unspecified: Secondary | ICD-10-CM | POA: Diagnosis not present

## 2021-09-06 DIAGNOSIS — N182 Chronic kidney disease, stage 2 (mild): Secondary | ICD-10-CM | POA: Diagnosis not present

## 2021-09-06 DIAGNOSIS — E559 Vitamin D deficiency, unspecified: Secondary | ICD-10-CM | POA: Diagnosis not present

## 2021-09-06 DIAGNOSIS — I129 Hypertensive chronic kidney disease with stage 1 through stage 4 chronic kidney disease, or unspecified chronic kidney disease: Secondary | ICD-10-CM | POA: Diagnosis not present

## 2021-09-19 ENCOUNTER — Other Ambulatory Visit: Payer: Medicare Other

## 2021-09-19 DIAGNOSIS — R972 Elevated prostate specific antigen [PSA]: Secondary | ICD-10-CM | POA: Diagnosis not present

## 2021-09-20 LAB — PSA: Prostate Specific Ag, Serum: 3.6 ng/mL (ref 0.0–4.0)

## 2021-10-09 ENCOUNTER — Other Ambulatory Visit: Payer: Self-pay | Admitting: Cardiology

## 2021-10-26 DIAGNOSIS — H04522 Eversion of left lacrimal punctum: Secondary | ICD-10-CM | POA: Diagnosis not present

## 2021-10-26 DIAGNOSIS — Z6825 Body mass index (BMI) 25.0-25.9, adult: Secondary | ICD-10-CM | POA: Diagnosis not present

## 2021-10-26 DIAGNOSIS — G4700 Insomnia, unspecified: Secondary | ICD-10-CM | POA: Diagnosis not present

## 2021-10-26 DIAGNOSIS — H04222 Epiphora due to insufficient drainage, left lacrimal gland: Secondary | ICD-10-CM | POA: Diagnosis not present

## 2021-10-26 DIAGNOSIS — L905 Scar conditions and fibrosis of skin: Secondary | ICD-10-CM | POA: Diagnosis not present

## 2021-10-26 DIAGNOSIS — H04122 Dry eye syndrome of left lacrimal gland: Secondary | ICD-10-CM | POA: Diagnosis not present

## 2021-10-26 DIAGNOSIS — H02115 Cicatricial ectropion of left lower eyelid: Secondary | ICD-10-CM | POA: Diagnosis not present

## 2021-10-30 ENCOUNTER — Encounter (INDEPENDENT_AMBULATORY_CARE_PROVIDER_SITE_OTHER): Payer: Medicare Other | Admitting: Ophthalmology

## 2021-10-30 DIAGNOSIS — H35033 Hypertensive retinopathy, bilateral: Secondary | ICD-10-CM | POA: Diagnosis not present

## 2021-10-30 DIAGNOSIS — H33301 Unspecified retinal break, right eye: Secondary | ICD-10-CM

## 2021-10-30 DIAGNOSIS — I10 Essential (primary) hypertension: Secondary | ICD-10-CM | POA: Diagnosis not present

## 2021-10-30 DIAGNOSIS — H35342 Macular cyst, hole, or pseudohole, left eye: Secondary | ICD-10-CM | POA: Diagnosis not present

## 2021-10-30 DIAGNOSIS — H43813 Vitreous degeneration, bilateral: Secondary | ICD-10-CM

## 2021-11-07 ENCOUNTER — Ambulatory Visit (INDEPENDENT_AMBULATORY_CARE_PROVIDER_SITE_OTHER): Payer: Medicare Other | Admitting: Cardiology

## 2021-11-07 ENCOUNTER — Encounter: Payer: Self-pay | Admitting: *Deleted

## 2021-11-07 ENCOUNTER — Encounter: Payer: Self-pay | Admitting: Cardiology

## 2021-11-07 VITALS — BP 148/70 | HR 68 | Ht 70.0 in | Wt 167.8 lb

## 2021-11-07 DIAGNOSIS — I2510 Atherosclerotic heart disease of native coronary artery without angina pectoris: Secondary | ICD-10-CM

## 2021-11-07 DIAGNOSIS — I10 Essential (primary) hypertension: Secondary | ICD-10-CM | POA: Diagnosis not present

## 2021-11-07 DIAGNOSIS — E782 Mixed hyperlipidemia: Secondary | ICD-10-CM

## 2021-11-07 NOTE — Progress Notes (Signed)
Clinical Summary [name] is a 76 y.o.male seen today for follow up of the following medical problems.    1. CAD - CABG in 2000 - 06/2015 completed echo which showed normal LVEF. Exercise MPI he did 8 min 25 sec with no EKG changes and no ischemia by imaging. - has not been on ACE-I since prior admission with AKI, had also been on NSAIDs at that time.    - admit 08/2016 with NSTEMI, cath as reported below, no interventional targets, managed medically. Imdur added to medical regimen and lopressor 12.'5mg'$  bid added. - lopressor later stopped due to bradycardia    - no chest pain, no SOB/DOE  - compliant with meds    2. HTN - metoprolol stopped due to low HR's in 09/2017 - prior AKI on ACE-I, though heavy NSAID use at the time also -listed hydralazine, aldactone allergies.      - we previously increased norvasc to '10mg'$  daily. Reports some prior swelling in the past on norvasc but has tolerated reasonablly well since then - reports high sodium intake around the time of swelling. After about a day the swelling resolved on its own.     - home bp's 130s-140s/70s, HR 60s   3. Hyperlipidemia - leg pains on vytorin. He is on pravastatin -recent labs with pcp   4. CKD - followed by nephrology by Dr Theador Hawthorne  Last Cr up to 1.22   5. LE edema -imprioved with prn lasix, had recent varicose vein procedure which helped alot - takes just prn, has not needed  6. Varicose veins - prior intervention       Past Medical History:  Diagnosis Date   Arteriosclerotic cardiovascular disease (ASCVD)    CABG in 08/1998  (LIMA-mLAD, SVG-rPDA, SVG-OM3-with Y SVG-lateral OM3.)   Atypical nevus 11/29/2005   slight atypia - left hand   Basal cell carcinoma of skin 03/22/2004   Left scapula, superior - CX3 + 5FU   Basal cell carcinoma of skin 03/22/2004   Left scapula, inferior - CX3 + excision   Basal cell carcinoma of skin 03/22/2004   Center mid back - CX3 + 5FU   Basal cell carcinoma of  skin 07/24/2004   Left shin - CX3 + 5FU   Basal cell carcinoma of skin 11/29/2005   Left shoulder - CX3 + 5FU   Basal cell carcinoma of skin 10/22/2006   ulcerated on right outer knee - CX3+5FU   Basal cell carcinoma of skin 12/02/2008   left upper arm, superior - CX3+5FU   Basal cell carcinoma of skin 05/16/2011   right shoulder   Basal cell carcinoma of skin 05/16/2011   ulcerated above left sideburn   Basal cell carcinoma of skin 05/16/2011   lateral left arm   Basal cell carcinoma of skin 06/29/2015   nodular on left ear - tx p bx   Basal cell carcinoma of skin 06/08/2016   superficial on left scapha, ear (MOHs)   Basal cell carcinoma of skin 07/25/2017   nodular on left ear (MOHS)   Basal cell carcinoma of skin 07/25/2017   nodular on left thigh - tx p bx   Basal cell carcinoma of skin 11/18/2018   nodular on right nare - tx p bx   BCC (basal cell carcinoma of skin) 08/25/2019   left upper arm anterior (CX35FU)   BCC (basal cell carcinoma of skin) 08/25/2019   right chin   BPH (benign prostatic hypertrophy)    h/x  of prostatis/ UTI in 2006   Chronic kidney disease, stage II (mild) 2010   Creatinine of 1.5 in 2010   GERD (gastroesophageal reflux disease)    s/p espohageal dilatatin and repair of hiatal hernia   Hyperlipidemia    Hypertension    IBS (irritable bowel syndrome)    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   Neck-posterior   PONV (postoperative nausea and vomiting)    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   right buccal cheek (CX35FU)   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   right elbow(CX35FU)   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   Right Forearm Upper(well diff) (tx p bx)   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   Right Index Finger(in situ) (tx p bx)   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   Left Forearm,upper(in situ) (tx p bx)   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/18/2018   Base of Left  Middle Finger(in situ) (tx p bx)   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/18/2018   Right Outer Forearm,sup(in situ) (tx p bx)   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/18/2018   Right Forearm,Inf(in situ) (tx p bx)   Sigmoid diverticulosis    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   in situ on right hand - CX3 + 5FU   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   in situ on right ear - CX3 + 5FU   Squamous cell carcinoma of skin 01/21/2006   in situ on upper mid back - tx p bx   Squamous cell carcinoma of skin 01/21/2006   in situ on top left shoulder - tx p bx   Squamous cell carcinoma of skin 10/22/2006   KA under left eye - MOHs   Squamous cell carcinoma of skin 12/02/2008   in situ on left base of 4th finger - CX3+5FU   Squamous cell carcinoma of skin 08/30/2009   well differentiated on right jawline   Squamous cell carcinoma of skin 08/30/2009   in situ on left upper arm - CX3+imiquimod   Squamous cell carcinoma of skin 02/25/2014   in situ on left temple   Squamous cell carcinoma of skin 02/25/2014   in situ on left arm   Squamous cell carcinoma of skin 06/08/2016   in situ on left lower side neck - tx p bx   Squamous cell carcinoma of skin 06/08/2016   left shin - tx p bx   Squamous cell carcinoma of skin 06/08/2016   in situ on right temple - tx p bx   Squamous cell carcinoma of skin 06/08/2016   in situ on right jawline - tx p bx     Allergies  Allergen Reactions   Meperidine Nausea And Vomiting and Nausea Only   Meperidine Hcl Nausea Only   Statins Other (See Comments)    Severe muscle pain   Codeine Nausea And Vomiting   Hydralazine     neuropathy    Nalbuphine Nausea And Vomiting   Spironolactone     MUSCLE PAIN      Current Outpatient Medications  Medication Sig Dispense Refill   acetaminophen (TYLENOL) 325 MG tablet Take 650 mg by mouth every 6 (six) hours as needed for moderate pain.     amLODipine (NORVASC) 10 MG tablet TAKE 1 TABLET BY  MOUTH EVERY DAY 90 tablet 3   aspirin 81 MG EC tablet Take 81 mg by mouth at bedtime.     cholecalciferol (  VITAMIN D3) 25 MCG (1000 UNIT) tablet Take 1,000 Units by mouth daily.     cyclobenzaprine (FLEXERIL) 10 MG tablet Take 10 mg by mouth every 8 (eight) hours as needed.     finasteride (PROSCAR) 5 MG tablet Take 1 tablet (5 mg total) by mouth every evening. 30 tablet 5   furosemide (LASIX) 20 MG tablet      isosorbide mononitrate (IMDUR) 30 MG 24 hr tablet TAKE 1/2 TABLET BY MOUTH EVERY DAY 45 tablet 2   neomycin-polymyxin b-dexamethasone (MAXITROL) 3.5-10000-0.1 OINT SMARTSIG:Sparingly Left Eye Twice Daily     omeprazole (PRILOSEC) 20 MG capsule Take 20 mg by mouth 2 (two) times daily. Before LUNCH and Before SUPPER     pravastatin (PRAVACHOL) 40 MG tablet Take 40 mg by mouth at bedtime.     Probiotic Product (PROBIOTIC DAILY PO) Take 1 capsule by mouth daily.      psyllium (METAMUCIL) 58.6 % powder Take 1 packet by mouth daily.     tamsulosin (FLOMAX) 0.4 MG CAPS capsule Take 0.4 mg by mouth at bedtime.     traMADol (ULTRAM) 50 MG tablet Take 50 mg by mouth every 6 (six) hours as needed.     zolpidem (AMBIEN) 10 MG tablet Take 5 tablets by mouth at bedtime.     No current facility-administered medications for this visit.     Past Surgical History:  Procedure Laterality Date   BREAST BIOPSY Right 08/17/2013   Procedure: RIGHT BREAST BIOPSY;  Surgeon: [name], MD;  Location: [facility];  Service: General;  Laterality: Right;   CHOLECYSTECTOMY  1990s   COLONOSCOPY  03/17/2012   [place]; colonic diverticulosis, repeat screening colonoscopy in 10 years.   CORONARY ARTERY BYPASS GRAFT  2000   ESOPHAGOGASTRODUODENOSCOPY  May 2009   [place]ourk: Food impaction, critical esophageal ring/stricture requiring dilation   HEMORRHOID SURGERY  2007   total of three   [place] CATH AND CORS/GRAFTS ANGIOGRAPHY N/A 08/23/2016   Procedure: Left Heart Cath and Cors/Grafts  Angiography;  Surgeon: Leonie Man, MD;  Location: [facility];  Service: Cardiovascular;  Laterality: N/A;   LUMBAR LAMINECTOMY  1999     Allergies  Allergen Reactions   Meperidine Nausea And Vomiting and Nausea Only   Meperidine Hcl Nausea Only   Statins Other (See Comments)    Severe muscle pain   Codeine Nausea And Vomiting   Hydralazine     neuropathy    Nalbuphine Nausea And Vomiting   Spironolactone     MUSCLE PAIN       Family History  Problem Relation Age of Onset   Coronary artery disease Father        also grandfather   Colon cancer Neg Hx      Social History Mr. Releford reports that he has never smoked. He has never used smokeless tobacco. Mr. Halls reports that he does not currently use alcohol after a past usage of about 2.0 standard drinks of alcohol per week.   Review of Systems CONSTITUTIONAL: No weight loss, fever, chills, weakness or fatigue.  HEENT: Eyes: No visual loss, blurred vision, double vision or yellow sclerae.No hearing loss, sneezing, congestion, runny nose or sore throat.  SKIN: No rash or itching.  CARDIOVASCULAR: per hpi RESPIRATORY: No shortness of breath, cough or sputum.  GASTROINTESTINAL: No anorexia, nausea, vomiting or diarrhea. No abdominal pain or blood.  GENITOURINARY: No burning on urination, no polyuria NEUROLOGICAL: No headache, dizziness,  syncope, paralysis, ataxia, numbness or tingling in the extremities. No change in bowel or bladder control.  MUSCULOSKELETAL: No muscle, back pain, joint pain or stiffness.  LYMPHATICS: No enlarged nodes. No history of splenectomy.  PSYCHIATRIC: No history of depression or anxiety.  ENDOCRINOLOGIC: No reports of sweating, cold or heat intolerance. No polyuria or polydipsia.  Marland Kitchen   Physical Examination Today's Vitals   11/07/21 1327  BP: (!) 148/70  Pulse: 68  SpO2: 99%  Weight: 167 lb 12.8 oz (76.1 kg)  Height: '5\' 10"'$  (1.778 m)   Body mass index is 24.08 kg/m.  Gen:  resting comfortably, no acute distress HEENT: no scleral icterus, pupils equal round and reactive, no palptable cervical adenopathy,  CV: RRR, no m/r/g no jvd Resp: Clear to auscultation bilaterally GI: abdomen is soft, non-tender, non-distended, normal bowel sounds, no hepatosplenomegaly MSK: extremities are warm, no edema.  Skin: warm, no rash Neuro:  no focal deficits Psych: appropriate affect   Diagnostic Studies 06/2015 echo Study Conclusions   - Left ventricle: The cavity size was normal. Wall thickness was   increased increased in a pattern of mild to moderate LVH.   Systolic function was normal. The estimated ejection fraction was   in the range of 60% to 65%. Wall motion was normal; there were no   regional wall motion abnormalities. Left ventricular diastolic   function parameters were normal. - Aortic valve: Mildly calcified annulus. Trileaflet; mildly   thickened leaflets. Valve area (VTI): 2.58 cm^2. Valve area   (Vmax): 2.89 cm^2. - Mitral valve: There was mild regurgitation. - Systemic veins: Small IVC, suggesting low RA pressures and   hypovolemia. - Technically adequate study.     06/2015 Exercise Stress MPI Blood pressure demonstrated a hypertensive response to exercise. The study is normal. This is a low risk study. The left ventricular ejection fraction is normal (55-65%).     08/2016 cath Mid RCA lesion, 80 %stenosed. Dist RCA lesion, 100 %stenosed. SVG-mRPDA graft was visualized by angiography and is large and anatomically normal. Potential Culpril (not PCI Target). Ost RPDA lesion, 90 %stenosed, Ost RPDA to RPDA lesion, 70 %stenosed.- filled retrograde from SVG Ost 3rd Mrg to 3rd Mrg lesion, 80 %stenosed. SVG-3rd OM graft was visualized by angiography and is large and anatomically normal. Y Graft SVG-Lat 3rd OM is moderate in size and anatomically normal. Origin lesion, 40 %stenosed. Ost LAD to Prox LAD lesion, 80 %stenosed. Prox LAD to Mid LAD lesion,  99 %stenosed. LIMA graft was visualized by angiography and is normal in caliber and anatomically normal. The left ventricular systolic function is normal. The left ventricular ejection fraction is 55-65% by visual estimate. LV end diastolic pressure is normal.   No obvious culprit lesions found. Clearly has small vessel disease involving the nongrafted diagonal Harlan Vinal but this is not a very approachable PCI target. The other potential culprit is the severe disease in the retrograde flow from SVG-RPDA back to the PL system. Is also not PCI target.   Recommend: Optimize medical management    Assessment and Plan   1. CAD - no symptoms, continue current meds   2. HTN - Bradycardia on beta blockers, AKI on ACEI, also avoiding diuretic due to renal dysfunction. Allergies to hydralazine and aldactone. He is on flomax (alpha 1 blocker), could consider talking with urology if could change to doxazosin for more bp effect if needed in the future. . Could consider clonidine.  -overall home bp's remain in 130s-140s, given med limitations would accept  these bp's at this time.    3. Hyperlipidemia -request pcp labs        Arnoldo Lenis, M.D.

## 2021-11-07 NOTE — Patient Instructions (Signed)
Medication Instructions:  Continue all current medications.   Labwork: none  Testing/Procedures: none  Follow-Up: 6 months   Any Other Special Instructions Will Be Listed Below (If Applicable).   If you need a refill on your cardiac medications before your next appointment, please call your pharmacy.  

## 2021-12-20 ENCOUNTER — Telehealth: Payer: Self-pay

## 2021-12-20 MED ORDER — FINASTERIDE 5 MG PO TABS: 5.0000 mg | ORAL_TABLET | Freq: Every evening | ORAL | 0 refills | Status: DC

## 2021-12-20 NOTE — Telephone Encounter (Signed)
Made patient aware that prescription refill was sent into his pharmacy. Patient voiced understanding.

## 2021-12-20 NOTE — Telephone Encounter (Signed)
Made patient aware that I will send rx request for finasteride to Dr. Diona Fanti. Patient voiced understanding. Patient is schedule to see Dr. Diona Fanti in December.

## 2021-12-20 NOTE — Telephone Encounter (Signed)
Patient left a voice message 12-20-2021  Pt is out of medication.  Needing a refill.  Please advise.  Thanks, Helene Kelp

## 2022-01-08 DIAGNOSIS — H35372 Puckering of macula, left eye: Secondary | ICD-10-CM | POA: Diagnosis not present

## 2022-01-30 ENCOUNTER — Ambulatory Visit: Payer: Medicare Other | Admitting: Physician Assistant

## 2022-02-19 DIAGNOSIS — H10502 Unspecified blepharoconjunctivitis, left eye: Secondary | ICD-10-CM | POA: Diagnosis not present

## 2022-02-21 ENCOUNTER — Other Ambulatory Visit: Payer: Self-pay | Admitting: Cardiology

## 2022-02-26 DIAGNOSIS — E559 Vitamin D deficiency, unspecified: Secondary | ICD-10-CM | POA: Diagnosis not present

## 2022-02-26 DIAGNOSIS — I129 Hypertensive chronic kidney disease with stage 1 through stage 4 chronic kidney disease, or unspecified chronic kidney disease: Secondary | ICD-10-CM | POA: Diagnosis not present

## 2022-02-26 DIAGNOSIS — N182 Chronic kidney disease, stage 2 (mild): Secondary | ICD-10-CM | POA: Diagnosis not present

## 2022-03-04 ENCOUNTER — Emergency Department (HOSPITAL_COMMUNITY)
Admission: EM | Admit: 2022-03-04 | Discharge: 2022-03-04 | Disposition: A | Discharge status: Home / Self Care | Payer: Medicare Other | Attending: Emergency Medicine | Admitting: Emergency Medicine

## 2022-03-04 ENCOUNTER — Other Ambulatory Visit: Payer: Self-pay

## 2022-03-04 ENCOUNTER — Encounter (HOSPITAL_COMMUNITY): Payer: Self-pay

## 2022-03-04 ENCOUNTER — Emergency Department (HOSPITAL_COMMUNITY): Payer: Medicare Other

## 2022-03-04 DIAGNOSIS — N200 Calculus of kidney: Secondary | ICD-10-CM | POA: Diagnosis not present

## 2022-03-04 DIAGNOSIS — Z79899 Other long term (current) drug therapy: Secondary | ICD-10-CM | POA: Diagnosis not present

## 2022-03-04 DIAGNOSIS — I2510 Atherosclerotic heart disease of native coronary artery without angina pectoris: Secondary | ICD-10-CM | POA: Diagnosis not present

## 2022-03-04 DIAGNOSIS — K5900 Constipation, unspecified: Secondary | ICD-10-CM | POA: Diagnosis not present

## 2022-03-04 DIAGNOSIS — Z7982 Long term (current) use of aspirin: Secondary | ICD-10-CM | POA: Insufficient documentation

## 2022-03-04 DIAGNOSIS — Z85828 Personal history of other malignant neoplasm of skin: Secondary | ICD-10-CM | POA: Diagnosis not present

## 2022-03-04 DIAGNOSIS — Z9049 Acquired absence of other specified parts of digestive tract: Secondary | ICD-10-CM | POA: Diagnosis not present

## 2022-03-04 DIAGNOSIS — Z951 Presence of aortocoronary bypass graft: Secondary | ICD-10-CM | POA: Diagnosis not present

## 2022-03-04 HISTORY — DX: Horseshoe tear of retina without detachment, right eye: H33.311

## 2022-03-04 LAB — CBC WITH DIFFERENTIAL/PLATELET
Abs Immature Granulocytes: 0.02 10*3/uL (ref 0.00–0.07)
Basophils Absolute: 0.1 10*3/uL (ref 0.0–0.1)
Basophils Relative: 1 %
Eosinophils Absolute: 0 10*3/uL (ref 0.0–0.5)
Eosinophils Relative: 1 %
HCT: 44.3 % (ref 39.0–52.0)
Hemoglobin: 15 g/dL (ref 13.0–17.0)
Immature Granulocytes: 0 %
Lymphocytes Relative: 24 %
Lymphs Abs: 2 10*3/uL (ref 0.7–4.0)
MCH: 33.2 pg (ref 26.0–34.0)
MCHC: 33.9 g/dL (ref 30.0–36.0)
MCV: 98 fL (ref 80.0–100.0)
Monocytes Absolute: 0.6 10*3/uL (ref 0.1–1.0)
Monocytes Relative: 7 %
Neutro Abs: 5.6 10*3/uL (ref 1.7–7.7)
Neutrophils Relative %: 67 %
Platelets: 186 10*3/uL (ref 150–400)
RBC: 4.52 MIL/uL (ref 4.22–5.81)
RDW: 13 % (ref 11.5–15.5)
WBC: 8.3 10*3/uL (ref 4.0–10.5)
nRBC: 0 % (ref 0.0–0.2)

## 2022-03-04 LAB — COMPREHENSIVE METABOLIC PANEL
ALT: 15 U/L (ref 0–44)
AST: 19 U/L (ref 15–41)
Albumin: 4.1 g/dL (ref 3.5–5.0)
Alkaline Phosphatase: 62 U/L (ref 38–126)
Anion gap: 8 (ref 5–15)
BUN: 14 mg/dL (ref 8–23)
CO2: 27 mmol/L (ref 22–32)
Calcium: 9.9 mg/dL (ref 8.9–10.3)
Chloride: 104 mmol/L (ref 98–111)
Creatinine, Ser: 1.23 mg/dL (ref 0.61–1.24)
GFR, Estimated: >60 mL/min (ref >60)
Glucose, Bld: 111 mg/dL — ABNORMAL HIGH (ref 70–99)
Potassium: 4.2 mmol/L (ref 3.5–5.1)
Sodium: 139 mmol/L (ref 135–145)
Total Bilirubin: 1.4 mg/dL — ABNORMAL HIGH (ref 0.3–1.2)
Total Protein: 7.1 g/dL (ref 6.5–8.1)

## 2022-03-04 LAB — URINALYSIS, ROUTINE W REFLEX MICROSCOPIC
Bilirubin Urine: NEGATIVE
Glucose, UA: NEGATIVE mg/dL
Hgb urine dipstick: NEGATIVE
Ketones, ur: NEGATIVE mg/dL
Leukocytes,Ua: NEGATIVE
Nitrite: NEGATIVE
Protein, ur: NEGATIVE mg/dL
Specific Gravity, Urine: 1.005 (ref 1.005–1.030)
pH: 7 (ref 5.0–8.0)

## 2022-03-04 LAB — LIPASE, BLOOD: Lipase: 25 U/L (ref 11–51)

## 2022-03-04 LAB — OCCULT BLOOD X 1 CARD TO LAB, STOOL: Fecal Occult Bld: NEGATIVE

## 2022-03-04 MED ORDER — POLYETHYLENE GLYCOL 3350 17 GM/SCOOP PO POWD: 17.0000 g | Freq: Every day | ORAL | 0 refills | Status: AC

## 2022-03-04 MED ORDER — IOHEXOL 300 MG/ML  SOLN
100.0000 mL | Freq: Once | INTRAMUSCULAR | Status: AC | PRN
  Administered 2022-03-04: 100 mL via INTRAVENOUS

## 2022-03-04 MED ORDER — LIDOCAINE HCL URETHRAL/MUCOSAL 2 % EX GEL
1.0000 | Freq: Once | CUTANEOUS | Status: AC
  Administered 2022-03-04: 1 via TOPICAL
  Filled 2022-03-04: qty 10

## 2022-03-04 MED ORDER — MILK AND MOLASSES ENEMA
1.0000 | Freq: Once | RECTAL | Status: AC
  Administered 2022-03-04: 240 mL via RECTAL
  Filled 2022-03-04: qty 240

## 2022-03-04 NOTE — ED Notes (Signed)
Pt returned from CT °

## 2022-03-04 NOTE — ED Provider Notes (Signed)
[facility] EMERGENCY DEPARTMENT Provider Note   CSN: [id number] Arrival date & time: 03/04/22  1319     History  Chief Complaint  Patient presents with   Constipation    [name] is a 76 y.o. male.  The history is provided by the patient and medical records. No language interpreter was used.  Constipation    76 year old male with known history of chronic constipation, irritable bowel syndrome, CAD status post CABG presenting with complaints of constipation.  Patient report for the past 7 days he has not had a bowel movement.  He states he otherwise feeling fine, no fever or chills no nausea vomiting he is able to eat and drink fine and able to pass flatus.  He just does not seems to have a bowel movement despite using over-the-counter medications such as prune juice, laxative, and other over-the-counter measure.  He has had intermittent bouts of constipation throughout the past several months.  No prior history of SBO.  Does have previous history of skin cancer.  Patient has had cholecystectomy, hiatal hernia repair, lumbar laminectomy, hemorrhoid surgery.  Patient denies any change in his diet or any new medication.  He did notice that he is peeing more than usual but states he is drinking a lot more fluid.  Denies any burning urination.  Home Medications Prior to Admission medications   Medication Sig Start Date End Date Taking? Authorizing Provider  acetaminophen (TYLENOL) 325 MG tablet Take 650 mg by mouth every 6 (six) hours as needed for moderate pain.    [provider]  amLODipine (NORVASC) 10 MG tablet TAKE 1 TABLET BY MOUTH EVERY DAY 10/09/21   Arnoldo Lenis, MD  aspirin 81 MG EC tablet Take 81 mg by mouth at bedtime.    [provider]  cholecalciferol (VITAMIN D3) 25 MCG (1000 UNIT) tablet Take 1,000 Units by mouth daily.    [provider]  cyclobenzaprine (FLEXERIL) 10 MG tablet Take 10 mg by mouth every 8 (eight) hours as needed.  09/07/20   [provider]  finasteride (PROSCAR) 5 MG tablet Take 1 tablet (5 mg total) by mouth every evening. 12/20/21   Franchot Gallo, MD  furosemide (LASIX) 20 MG tablet  06/12/19   [provider]  isosorbide mononitrate (IMDUR) 30 MG 24 hr tablet TAKE 1/2 TABLET BY MOUTH EVERY DAY 02/21/22   Arnoldo Lenis, MD  omeprazole (PRILOSEC) 20 MG capsule Take 20 mg by mouth 2 (two) times daily. Before LUNCH and Before SUPPER    [provider]  pravastatin (PRAVACHOL) 40 MG tablet Take 40 mg by mouth at bedtime. 03/31/21   [provider]  Probiotic Product (PROBIOTIC DAILY PO) Take 1 capsule by mouth daily.     [provider]  psyllium (METAMUCIL) 58.6 % powder Take 1 packet by mouth daily.    [provider]  tamsulosin (FLOMAX) 0.4 MG CAPS capsule Take 0.4 mg by mouth at bedtime.    [provider]  zolpidem (AMBIEN) 10 MG tablet Take 5 tablets by mouth at bedtime. 04/26/18   [provider]      Allergies    Meperidine, Meperidine hcl, Statins, Codeine, Hydralazine, Nalbuphine, and Spironolactone    Review of Systems   Review of Systems  Gastrointestinal:  Positive for constipation.  All other systems reviewed and are negative.   Physical Exam Updated Vital Signs BP (!) 169/93 (BP Location: Right Arm)   Pulse 68   Temp 98 F (36.7  C) (Oral)   Resp 20   Ht '5\' 10"'$  (1.778 m)   Wt 76.2 kg   SpO2 100%   BMI 24.11 kg/m  Physical Exam Vitals and nursing note reviewed.  Constitutional:      General: He is not in acute distress.    Appearance: He is well-developed.  HENT:     Head: Atraumatic.  Eyes:     Conjunctiva/sclera: Conjunctivae normal.  Cardiovascular:     Rate and Rhythm: Normal rate and regular rhythm.     Pulses: Normal pulses.     Heart sounds: Normal heart sounds.  Pulmonary:     Effort: Pulmonary effort is normal.     Breath sounds: Normal breath sounds.  Abdominal:     General:  Bowel sounds are normal.     Palpations: Abdomen is soft.     Tenderness: There is no abdominal tenderness.  Genitourinary:    Comments: Chaperone present during exam.  Patient has normal rectal tone, no obvious mass, he does have impacted stool in the rectal vault.  Normal color stool on glove.  No thrombosed hemorrhoid. Musculoskeletal:     Cervical back: Neck supple.  Skin:    Findings: No rash.  Neurological:     Mental Status: He is alert.     ED Results / Procedures / Treatments   Labs (all labs ordered are listed, but only abnormal results are displayed) Labs Reviewed  COMPREHENSIVE METABOLIC PANEL - Abnormal; Notable for the following components:      Result Value   Glucose, Bld 111 (*)    Total Bilirubin 1.4 (*)    All other components within normal limits  URINALYSIS, ROUTINE W REFLEX MICROSCOPIC - Abnormal; Notable for the following components:   Color, Urine STRAW (*)    All other components within normal limits  CBC WITH DIFFERENTIAL/PLATELET  LIPASE, BLOOD  OCCULT BLOOD X 1 CARD TO LAB, STOOL    EKG None  Radiology CT ABDOMEN PELVIS W CONTRAST  Result Date: 03/04/2022 CLINICAL DATA:  Constipation EXAM: CT ABDOMEN AND PELVIS WITH CONTRAST TECHNIQUE: Multidetector CT imaging of the abdomen and pelvis was performed using the standard protocol following bolus administration of intravenous contrast. RADIATION DOSE REDUCTION: This exam was performed according to the departmental dose-optimization program which includes automated exposure control, adjustment of the mA and/or kV according to patient size and/or use of iterative reconstruction technique. CONTRAST:  120m OMNIPAQUE IOHEXOL 300 MG/ML  SOLN COMPARISON:  05/30/2018 FINDINGS: Lower chest: No acute abnormality. Small to moderate-sized hiatal hernia. Hepatobiliary: No focal liver abnormality is seen. Status post cholecystectomy. No biliary dilatation. Pancreas: No focal abnormality or ductal dilatation. Spleen: No  focal abnormality.  Normal size. Adrenals/Urinary Tract: Septated cyst in the midpole of the left kidney measuring up to 2.1 cm compared to 2.8 cm previously. No follow-up imaging recommended. No hydronephrosis. Punctate 1-2 mm nonobstructing stone in the lower pole of the right kidney. Adrenal glands and urinary bladder unremarkable. Stomach/Bowel: Left colonic diverticulosis. No active diverticulitis. Moderate stool burden. Stomach and small bowel decompressed, unremarkable. Vascular/Lymphatic: Aortic atherosclerosis. No evidence of aneurysm or adenopathy. Reproductive: Prostate enlargement Other: No free fluid or free air. Musculoskeletal: No acute bony abnormality. IMPRESSION: No acute findings in the abdomen or pelvis. Left colonic diverticulosis. No active diverticulitis. No bowel obstruction. Prostate enlargement. Aortic atherosclerosis. Small to moderate hiatal hernia. Electronically Signed   By: KRolm BaptiseM.D.   On: 03/04/2022 21:24   DG ABD ACUTE 2+V W 1V CHEST  Result  Date: 03/04/2022 CLINICAL DATA:  Constipation EXAM: DG ABDOMEN ACUTE WITH 1 VIEW CHEST COMPARISON:  Chest x-ray 12/29/2018. CT abdomen and pelvis 05/30/2018 FINDINGS: There is no evidence of dilated bowel loops or free intraperitoneal air. Stool burden is average. Cholecystectomy clips and rectal anastomosis are again seen. No radiopaque calculi. Heart size and mediastinal contours are within normal limits. Both lungs are clear. Sternotomy wires and mediastinal clips are again seen. Degenerative changes affect the spine. IMPRESSION: Negative abdominal radiographs.  No acute cardiopulmonary disease. Electronically Signed   By: Ronney Asters M.D.   On: 03/04/2022 17:04    Procedures Fecal disimpaction  Date/Time: 03/04/2022 6:52 PM  Performed by: Domenic Moras, PA-C Authorized by: Domenic Moras, PA-C  Comments: Chaperone present during exam.  Lido gel applied for comfort and lubrication.  Fecal disimpaction performed by me for  which I am able to remove several hard stool.  Patient tolerates well.       Medications Ordered in ED Medications  lidocaine (XYLOCAINE) 2 % jelly 1 Application (1 Application Topical Given by Other 03/04/22 1818)  milk and molasses enema (240 mLs Rectal Given 03/04/22 2039)  iohexol (OMNIPAQUE) 300 MG/ML solution 100 mL (100 mLs Intravenous Contrast Given 03/04/22 2111)    ED Course/ Medical Decision Making/ A&P                           Medical Decision Making Amount and/or Complexity of Data Reviewed Labs: ordered. Radiology: ordered.  Risk Prescription drug management.   BP (!) 169/93 (BP Location: Right Arm)   Pulse 68   Temp 98 F (36.7 C) (Oral)   Resp 20   Ht '5\' 10"'$  (1.778 m)   Wt 76.2 kg   SpO2 100%   BMI 24.11 kg/m   61:61 PM  76 year old male with known history of chronic constipation, irritable bowel syndrome, CAD status post CABG presenting with complaints of constipation.  Patient report for the past 7 days he has not had a bowel movement.  He states he otherwise feeling fine, no fever or chills no nausea vomiting he is able to eat and drink fine and able to pass flatus.  He just does not seems to have a bowel movement despite using over-the-counter medications such as prune juice, laxative, and other over-the-counter measure.  He has had intermittent bouts of constipation throughout the past several months.  No prior history of SBO.  Does have previous history of skin cancer.  Patient has had cholecystectomy, hiatal hernia repair, lumbar laminectomy, hemorrhoid surgery.  Patient denies any change in his diet or any new medication.  He did notice that he is peeing more than usual but states he is drinking a lot more fluid.  Denies any burning urination.  On exam this is a well-appearing elderly male laying bed appears to be in no acute discomfort.  His abdomen is soft nontender, bowel sounds present.  Chaperone was present, I examined his rectum and he does have  some mild impacted hard stool that may benefit from manual disimpaction.  6:53 PM -Labs ordered, independently viewed and interpreted by me.  Labs remarkable for reassuring labs, normal electrolytes, normal WBC and H&H -The patient was maintained on a cardiac monitor.  I personally viewed and interpreted the cardiac monitored which showed an underlying rhythm of: NSR -Imaging independently viewed and interpreted by me and I agree with radiologist's interpretation.  Result remarkable for acute abdominal series without SBO or free air.  Normal stool burden -This patient presents to the ED for concern of constipation, this involves an extensive number of treatment options, and is a complaint that carries with it a high risk of complications and morbidity.  The differential diagnosis includes stool impaction, functional constipation, sbo, malignancy -Co morbidities that complicate the patient evaluation includes hx of constipation -Treatment includes manual disimpaction -Reevaluation of the patient after these medicines showed that the patient improved -PCP office notes or outside notes reviewed -Discussion with attending Dr. Langston Masker -Escalation to admission/observation considered: patients feels much better, is comfortable with discharge, and will follow up with PCP -Prescription medication considered, patient comfortable with miralax -Social Determinant of Health considered w  8:32 PM I have performed manual disimpaction however patient was unable to have a bowel movement.  We will initiate milk of molasses enema.  Given his age, will also obtain abdominal pelvis CT scan to rule out any acute abdominal pathology.  9:29 PM CT scan of the abdomen pelvis obtained independently viewed interpreted by me and shows no acute finding.  Patient does have moderate stool burden and small to moderate hiatal hernia but otherwise nothing concerning.  Evidence of diverticulosis without evidence of diverticulitis.   Patient states he did have some success with bowel movement after receiving enema and felt better.  Will discharge home with MiraLAX, encourage patient to follow-up with PCP for further care.  Return precaution given.        Final Clinical Impression(s) / ED Diagnoses Final diagnoses:  Constipation, unspecified constipation type    Rx / DC Orders ED Discharge Orders          Ordered    polyethylene glycol powder (GLYCOLAX/MIRALAX) 17 GM/SCOOP powder  Daily        03/04/22 2137              Domenic Moras, PA-C 03/04/22 2138    Wyvonnia Dusky, MD 03/04/22 2200

## 2022-03-04 NOTE — ED Notes (Signed)
Dr. Langston Masker at bedside to attempt disimpaction. Small amount of hard stool removed. Provider instructed patient to attempt to use toilet at this time.

## 2022-03-04 NOTE — Discharge Instructions (Addendum)
You have been evaluated for your symptoms.  You have been diagnosed with constipation.  Take MiraLAX daily to help aid with bowel movement.  Follow-up with your doctor for further care.  Continue with prune juice daily as well.

## 2022-03-04 NOTE — ED Triage Notes (Signed)
Pt c/o constipation x 7 days. Pt has associated rectal fullness. Denies abd pain, N/V. Pt reports frequent hx of constipation. Pt has tried prunes, colace, and Ez2Go without relief.

## 2022-03-10 DIAGNOSIS — R600 Localized edema: Secondary | ICD-10-CM | POA: Diagnosis not present

## 2022-03-10 DIAGNOSIS — N281 Cyst of kidney, acquired: Secondary | ICD-10-CM | POA: Diagnosis not present

## 2022-03-10 DIAGNOSIS — I129 Hypertensive chronic kidney disease with stage 1 through stage 4 chronic kidney disease, or unspecified chronic kidney disease: Secondary | ICD-10-CM | POA: Diagnosis not present

## 2022-03-10 DIAGNOSIS — N1831 Chronic kidney disease, stage 3a: Secondary | ICD-10-CM | POA: Diagnosis not present

## 2022-03-10 DIAGNOSIS — N200 Calculus of kidney: Secondary | ICD-10-CM | POA: Diagnosis not present

## 2022-03-14 ENCOUNTER — Other Ambulatory Visit: Payer: Self-pay | Admitting: Urology

## 2022-03-20 ENCOUNTER — Ambulatory Visit (INDEPENDENT_AMBULATORY_CARE_PROVIDER_SITE_OTHER): Payer: Medicare Other | Admitting: Urology

## 2022-03-20 ENCOUNTER — Encounter: Payer: Self-pay | Admitting: Urology

## 2022-03-20 VITALS — BP 171/69 | HR 79

## 2022-03-20 DIAGNOSIS — R351 Nocturia: Secondary | ICD-10-CM | POA: Diagnosis not present

## 2022-03-20 DIAGNOSIS — N401 Enlarged prostate with lower urinary tract symptoms: Secondary | ICD-10-CM

## 2022-03-20 DIAGNOSIS — I2510 Atherosclerotic heart disease of native coronary artery without angina pectoris: Secondary | ICD-10-CM

## 2022-03-20 DIAGNOSIS — R350 Frequency of micturition: Secondary | ICD-10-CM | POA: Diagnosis not present

## 2022-03-20 DIAGNOSIS — R972 Elevated prostate specific antigen [PSA]: Secondary | ICD-10-CM

## 2022-03-20 DIAGNOSIS — N200 Calculus of kidney: Secondary | ICD-10-CM | POA: Diagnosis not present

## 2022-03-20 LAB — URINALYSIS, ROUTINE W REFLEX MICROSCOPIC
Bilirubin, UA: NEGATIVE
Glucose, UA: NEGATIVE
Ketones, UA: NEGATIVE
Leukocytes,UA: NEGATIVE
Nitrite, UA: NEGATIVE
Protein,UA: NEGATIVE
RBC, UA: NEGATIVE
Specific Gravity, UA: 1.01 (ref 1.005–1.030)
Urobilinogen, Ur: 0.2 mg/dL (ref 0.2–1.0)
pH, UA: 6 (ref 5.0–7.5)

## 2022-03-20 MED ORDER — FINASTERIDE 5 MG PO TABS: 5.0000 mg | ORAL_TABLET | Freq: Every evening | ORAL | 3 refills | Status: DC

## 2022-03-20 NOTE — Progress Notes (Signed)
History of Present Illness:   Here for f/u of elevated PSA.   He had a negative biopsy in Aug 2014. At that time, PSA was 9.8, prostatic volume was 90 mL, PSAD 0.11.  All 12 cores were benign.Marland Kitchen He is on finasteride. Since started, PSA has appropriately decreased.    6.2.2020: PSA 2.7 (corrected 5.4)    3.2.2021: PSA 4.8--corrected 9.6     3.8.2022: PSA 5.9 (corrected 11.8) IPSS 4   QoL score 2.  Recheck PSA in June 2021 3.9.   12.6.2022: PSA 4.3 (corrected 8.6).  He is still on tamsulosin as well as finasteride. IPSS 3, quality-of-life score 2.    12.5.2023: PSA 6 months ago was 3.6, corrected value 7.2.  Stable nonbothersome urinary symptomatology.  On both finasteride and tamsulosin.   Past Medical History:  Diagnosis Date   Arteriosclerotic cardiovascular disease (ASCVD)    CABG in 08/1998  (LIMA-mLAD, SVG-rPDA, SVG-OM3-with Y SVG-lateral OM3.)   Atypical nevus 11/29/2005   slight atypia - left hand   Basal cell carcinoma of skin 03/22/2004   Left scapula, superior - CX3 + 5FU   Basal cell carcinoma of skin 03/22/2004   Left scapula, inferior - CX3 + excision   Basal cell carcinoma of skin 03/22/2004   Center mid back - CX3 + 5FU   Basal cell carcinoma of skin 07/24/2004   Left shin - CX3 + 5FU   Basal cell carcinoma of skin 11/29/2005   Left shoulder - CX3 + 5FU   Basal cell carcinoma of skin 10/22/2006   ulcerated on right outer knee - CX3+5FU   Basal cell carcinoma of skin 12/02/2008   left upper arm, superior - CX3+5FU   Basal cell carcinoma of skin 05/16/2011   right shoulder   Basal cell carcinoma of skin 05/16/2011   ulcerated above left sideburn   Basal cell carcinoma of skin 05/16/2011   lateral left arm   Basal cell carcinoma of skin 06/29/2015   nodular on left ear - tx p bx   Basal cell carcinoma of skin 06/08/2016   superficial on left scapha, ear (MOHs)   Basal cell carcinoma of skin 07/25/2017   nodular on left ear (MOHS)   Basal cell carcinoma of  skin 07/25/2017   nodular on left thigh - tx p bx   Basal cell carcinoma of skin 11/18/2018   nodular on right nare - tx p bx   BCC (basal cell carcinoma of skin) 08/25/2019   left upper arm anterior (CX35FU)   BCC (basal cell carcinoma of skin) 08/25/2019   right chin   BPH (benign prostatic hypertrophy)    h/x of prostatis/ UTI in 2006   Chronic kidney disease, stage II (mild) 2010   Creatinine of 1.5 in 2010   GERD (gastroesophageal reflux disease)    s/p espohageal dilatatin and repair of hiatal hernia   Hyperlipidemia    Hypertension    IBS (irritable bowel syndrome)    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   Neck-posterior   PONV (postoperative nausea and vomiting)    Retinal tear of right eye    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   right buccal cheek (CX35FU)   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   right elbow(CX35FU)   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   Right Forearm Upper(well diff) (tx p bx)   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   Right Index Finger(in situ) (tx p bx)   SCCA (squamous cell carcinoma)  of skin 01/01/2018   Left Forearm,upper(in situ) (tx p bx)   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/18/2018   Base of Left Middle Finger(in situ) (tx p bx)   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/18/2018   Right Outer Forearm,sup(in situ) (tx p bx)   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/18/2018   Right Forearm,Inf(in situ) (tx p bx)   Sigmoid diverticulosis    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   in situ on right hand - CX3 + 5FU   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   in situ on right ear - CX3 + 5FU   Squamous cell carcinoma of skin 01/21/2006   in situ on upper mid back - tx p bx   Squamous cell carcinoma of skin 01/21/2006   in situ on top left shoulder - tx p bx   Squamous cell carcinoma of skin 10/22/2006   KA under left eye - MOHs   Squamous cell carcinoma of skin 12/02/2008   in  situ on left base of 4th finger - CX3+5FU   Squamous cell carcinoma of skin 08/30/2009   well differentiated on right jawline   Squamous cell carcinoma of skin 08/30/2009   in situ on left upper arm - CX3+imiquimod   Squamous cell carcinoma of skin 02/25/2014   in situ on left temple   Squamous cell carcinoma of skin 02/25/2014   in situ on left arm   Squamous cell carcinoma of skin 06/08/2016   in situ on left lower side neck - tx p bx   Squamous cell carcinoma of skin 06/08/2016   left shin - tx p bx   Squamous cell carcinoma of skin 06/08/2016   in situ on right temple - tx p bx   Squamous cell carcinoma of skin 06/08/2016   in situ on right jawline - tx p bx    Past Surgical History:  Procedure Laterality Date   BREAST BIOPSY Right 08/17/2013   Procedure: RIGHT BREAST BIOPSY;  Surgeon: [name], MD;  Location: [facility];  Service: General;  Laterality: Right;   CHOLECYSTECTOMY  1990s   COLONOSCOPY  03/17/2012   [place]; colonic diverticulosis, repeat screening colonoscopy in 10 years.   CORONARY ARTERY BYPASS GRAFT  04/16/1998   ESOPHAGOGASTRODUODENOSCOPY  08/15/2007   [place]k: Food impaction, critical esophageal ring/stricture requiring dilation   HEMORRHOID SURGERY  04/16/2005   total of three   HIATAL HERNIA REPAIR  04/16/1993   LEFT HEART CATH AND CORS/GRAFTS ANGIOGRAPHY N/A 08/23/2016   Procedure: Left Heart Cath and Cors/Grafts Angiography;  Surgeon: Leonie Man, MD;  Location: [facility];  Service: Cardiovascular;  Laterality: N/A;   LUMBAR LAMINECTOMY  04/16/1997   RETINAL DETACHMENT SURGERY     TEAR DUCT PROBING WITH STRABISMUS REPAIR      Home Medications:  Allergies as of 03/20/2022       Reactions   Meperidine Nausea And Vomiting, Nausea Only   Meperidine Hcl Nausea Only   Statins Other (See Comments)   Severe muscle pain   Codeine Nausea And Vomiting   Hydralazine    neuropathy    Nalbuphine Nausea And Vomiting   Spironolactone    MUSCLE  PAIN         Medication List        Accurate as of March 20, 2022  6:31 AM. If you have any questions, ask your nurse or doctor.  acetaminophen 325 MG tablet Commonly known as: TYLENOL Take 650 mg by mouth every 6 (six) hours as needed for moderate pain.   amLODipine 10 MG tablet Commonly known as: NORVASC TAKE 1 TABLET BY MOUTH EVERY DAY   aspirin EC 81 MG tablet Take 81 mg by mouth at bedtime.   cholecalciferol 25 MCG (1000 UNIT) tablet Commonly known as: VITAMIN D3 Take 1,000 Units by mouth daily.   cyclobenzaprine 10 MG tablet Commonly known as: FLEXERIL Take 10 mg by mouth every 8 (eight) hours as needed.   finasteride 5 MG tablet Commonly known as: PROSCAR Take 1 tablet (5 mg total) by mouth every evening.   furosemide 20 MG tablet Commonly known as: LASIX   isosorbide mononitrate 30 MG 24 hr tablet Commonly known as: IMDUR TAKE 1/2 TABLET BY MOUTH EVERY DAY   omeprazole 20 MG capsule Commonly known as: PRILOSEC Take 20 mg by mouth 2 (two) times daily. Before LUNCH and Before SUPPER   polyethylene glycol powder 17 GM/SCOOP powder Commonly known as: GLYCOLAX/MIRALAX Take 17 g by mouth daily.   pravastatin 40 MG tablet Commonly known as: PRAVACHOL Take 40 mg by mouth at bedtime.   PROBIOTIC DAILY PO Take 1 capsule by mouth daily.   psyllium 58.6 % powder Commonly known as: METAMUCIL Take 1 packet by mouth daily.   tamsulosin 0.4 MG Caps capsule Commonly known as: FLOMAX Take 0.4 mg by mouth at bedtime.   zolpidem 10 MG tablet Commonly known as: AMBIEN Take 5 tablets by mouth at bedtime.        Allergies:  Allergies  Allergen Reactions   Meperidine Nausea And Vomiting and Nausea Only   Meperidine Hcl Nausea Only   Statins Other (See Comments)    Severe muscle pain   Codeine Nausea And Vomiting   Hydralazine     neuropathy    Nalbuphine Nausea And Vomiting   Spironolactone     MUSCLE PAIN     Family History   Problem Relation Age of Onset   Coronary artery disease Father        also grandfather   Colon cancer Neg Hx     Social History:  reports that he has never smoked. He has never used smokeless tobacco. He reports that he does not currently use alcohol after a past usage of about 2.0 standard drinks of alcohol per week. He reports that he does not use drugs.  ROS: A complete review of systems was performed.  All systems are negative except for pertinent findings as noted.  Physical Exam:  Vital signs in last 24 hours: There were no vitals taken for this visit. Constitutional:  Alert and oriented, No acute distress Cardiovascular: Regular rate  Respiratory: Normal respiratory effor Genitourinary: Normal anal sphincter tone.  Prostate 60 g, symmetric, nonnodular, nontender. Lymphatic: No lymphadenopathy Neurologic: Grossly intact, no focal deficits Psychiatric: Normal mood and affect  I have reviewed prior pt notes  I have reviewed urinalysis results  I have independently reviewed prior imaging--recent CT images reviewed.  Small right lower pole stone.  I have reviewed prior PSA results   Impression/Assessment:  1.  BPH, on dual medical therapy with excellent response symptomatically  2.  Elevated PSA, negative biopsy 9 years ago, stable PSA trend  3.  Asymptomatic small right lower pole stone  Plan:  1.  Reassurance regarding his stone.  Apparently he has a 24-hour urine pending  2.  PSA is checked today  3.  I will see  back in 1 year following PSA

## 2022-03-21 LAB — PSA: Prostate Specific Ag, Serum: 4.1 ng/mL — ABNORMAL HIGH (ref 0.0–4.0)

## 2022-04-03 DIAGNOSIS — E663 Overweight: Secondary | ICD-10-CM | POA: Diagnosis not present

## 2022-04-03 DIAGNOSIS — Z0000 Encounter for general adult medical examination without abnormal findings: Secondary | ICD-10-CM | POA: Diagnosis not present

## 2022-04-03 DIAGNOSIS — E7849 Other hyperlipidemia: Secondary | ICD-10-CM | POA: Diagnosis not present

## 2022-04-03 DIAGNOSIS — Z1331 Encounter for screening for depression: Secondary | ICD-10-CM | POA: Diagnosis not present

## 2022-04-03 DIAGNOSIS — I10 Essential (primary) hypertension: Secondary | ICD-10-CM | POA: Diagnosis not present

## 2022-04-03 DIAGNOSIS — R7309 Other abnormal glucose: Secondary | ICD-10-CM | POA: Diagnosis not present

## 2022-04-03 DIAGNOSIS — K219 Gastro-esophageal reflux disease without esophagitis: Secondary | ICD-10-CM | POA: Diagnosis not present

## 2022-04-03 DIAGNOSIS — E119 Type 2 diabetes mellitus without complications: Secondary | ICD-10-CM | POA: Diagnosis not present

## 2022-04-03 DIAGNOSIS — Z6825 Body mass index (BMI) 25.0-25.9, adult: Secondary | ICD-10-CM | POA: Diagnosis not present

## 2022-04-03 DIAGNOSIS — E782 Mixed hyperlipidemia: Secondary | ICD-10-CM | POA: Diagnosis not present

## 2022-04-03 DIAGNOSIS — I2510 Atherosclerotic heart disease of native coronary artery without angina pectoris: Secondary | ICD-10-CM | POA: Diagnosis not present

## 2022-04-03 DIAGNOSIS — N183 Chronic kidney disease, stage 3 unspecified: Secondary | ICD-10-CM | POA: Diagnosis not present

## 2022-04-24 ENCOUNTER — Encounter: Payer: Self-pay | Admitting: Cardiology

## 2022-04-24 ENCOUNTER — Ambulatory Visit: Payer: Medicare HMO | Attending: Cardiology | Admitting: Cardiology

## 2022-04-24 VITALS — BP 145/75 | HR 82 | Ht 70.0 in | Wt 166.8 lb

## 2022-04-24 DIAGNOSIS — I2510 Atherosclerotic heart disease of native coronary artery without angina pectoris: Secondary | ICD-10-CM

## 2022-04-24 DIAGNOSIS — E782 Mixed hyperlipidemia: Secondary | ICD-10-CM | POA: Diagnosis not present

## 2022-04-24 DIAGNOSIS — I10 Essential (primary) hypertension: Secondary | ICD-10-CM

## 2022-04-24 MED ORDER — ISOSORBIDE MONONITRATE ER 30 MG PO TB24: 15.0000 mg | ORAL_TABLET | Freq: Every day | ORAL | 3 refills | Status: DC

## 2022-04-24 MED ORDER — EZETIMIBE 10 MG PO TABS: 10.0000 mg | ORAL_TABLET | Freq: Every day | ORAL | 6 refills | Status: DC

## 2022-04-24 NOTE — Progress Notes (Signed)
Clinical Summary Mr. Mcandrew is a 77 y.o.male seen today for follow up of the following medical problems.    1. CAD - CABG in 2000 - 06/2015 completed echo which showed normal LVEF. Exercise MPI he did 8 min 25 sec with no EKG changes and no ischemia by imaging. - has not been on ACE-I since prior admission with AKI, had also been on NSAIDs at that time.    - admit 08/2016 with NSTEMI, cath as reported below, no interventional targets, managed medically. Imdur added to medical regimen and lopressor 12.'5mg'$  bid added.  - lopressor later stopped due to bradycardia     - no chest pains, no SOB/DOE - compliant with meds       2. HTN - metoprolol stopped due to low HR's in 09/2017 - prior AKI on ACE-I, though heavy NSAID use at the time also -listed hydralazine, aldactone allergies.      - we previously increased norvasc to '10mg'$  daily. Reports some prior swelling in the past on norvasc but has tolerated reasonablly well since then - reports high sodium intake around the time of swelling. After about a day the swelling resolved on its own.      - home bp's 130s-140s/70s-80s, typically 130s/60s    3. Hyperlipidemia - leg pains on vytorin. He is on pravastatin 03/2022 TC 177 TG 99 HDL 57 LDL 102 - he is concerned about higher doses of statin     4. CKD - followed by nephrology by Dr Theador Hawthorne  Last Cr up to 1.22 02/2022 1.22   5. LE edema -imprioved with prn lasix, had recent varicose vein procedure which helped alot - takes just prn, has not needed   - resolved, no recent issues. Has not rewuired diuretic  6. Varicose veins - prior intervention     Past Medical History:  Diagnosis Date   Arteriosclerotic cardiovascular disease (ASCVD)    CABG in 08/1998  (LIMA-mLAD, SVG-rPDA, SVG-OM3-with Y SVG-lateral OM3.)   Atypical nevus 11/29/2005   slight atypia - left hand   Basal cell carcinoma of skin 03/22/2004   Left scapula, superior - CX3 + 5FU   Basal cell  carcinoma of skin 03/22/2004   Left scapula, inferior - CX3 + excision   Basal cell carcinoma of skin 03/22/2004   Center mid back - CX3 + 5FU   Basal cell carcinoma of skin 07/24/2004   Left shin - CX3 + 5FU   Basal cell carcinoma of skin 11/29/2005   Left shoulder - CX3 + 5FU   Basal cell carcinoma of skin 10/22/2006   ulcerated on right outer knee - CX3+5FU   Basal cell carcinoma of skin 12/02/2008   left upper arm, superior - CX3+5FU   Basal cell carcinoma of skin 05/16/2011   right shoulder   Basal cell carcinoma of skin 05/16/2011   ulcerated above left sideburn   Basal cell carcinoma of skin 05/16/2011   lateral left arm   Basal cell carcinoma of skin 06/29/2015   nodular on left ear - tx p bx   Basal cell carcinoma of skin 06/08/2016   superficial on left scapha, ear (MOHs)   Basal cell carcinoma of skin 07/25/2017   nodular on left ear (MOHS)   Basal cell carcinoma of skin 07/25/2017   nodular on left thigh - tx p bx   Basal cell carcinoma of skin 11/18/2018   nodular on right nare - tx p bx   BCC (basal cell  carcinoma of skin) 08/25/2019   left upper arm anterior (CX35FU)   BCC (basal cell carcinoma of skin) 08/25/2019   right chin   BPH (benign prostatic hypertrophy)    h/x of prostatis/ UTI in 2006   Chronic kidney disease, stage II (mild) 2010   Creatinine of 1.5 in 2010   GERD (gastroesophageal reflux disease)    s/p espohageal dilatatin and repair of hiatal hernia   Hyperlipidemia    Hypertension    IBS (irritable bowel syndrome)    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   Neck-posterior   PONV (postoperative nausea and vomiting)    Retinal tear of right eye    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   right buccal cheek (CX35FU)   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   right elbow(CX35FU)   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   Right Forearm Upper(well diff) (tx p bx)   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    Right Index Finger(in situ) (tx p bx)   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   Left Forearm,upper(in situ) (tx p bx)   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/18/2018   Base of Left Middle Finger(in situ) (tx p bx)   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/18/2018   Right Outer Forearm,sup(in situ) (tx p bx)   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/18/2018   Right Forearm,Inf(in situ) (tx p bx)   Sigmoid diverticulosis    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   in situ on right hand - CX3 + 5FU   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   in situ on right ear - CX3 + 5FU   Squamous cell carcinoma of skin 01/21/2006   in situ on upper mid back - tx p bx   Squamous cell carcinoma of skin 01/21/2006   in situ on top left shoulder - tx p bx   Squamous cell carcinoma of skin 10/22/2006   KA under left eye - MOHs   Squamous cell carcinoma of skin 12/02/2008   in situ on left base of 4th finger - CX3+5FU   Squamous cell carcinoma of skin 08/30/2009   well differentiated on right jawline   Squamous cell carcinoma of skin 08/30/2009   in situ on left upper arm - CX3+imiquimod   Squamous cell carcinoma of skin 02/25/2014   in situ on left temple   Squamous cell carcinoma of skin 02/25/2014   in situ on left arm   Squamous cell carcinoma of skin 06/08/2016   in situ on left lower side neck - tx p bx   Squamous cell carcinoma of skin 06/08/2016   left shin - tx p bx   Squamous cell carcinoma of skin 06/08/2016   in situ on right temple - tx p bx   Squamous cell carcinoma of skin 06/08/2016   in situ on right jawline - tx p bx     Allergies  Allergen Reactions   Meperidine Nausea And Vomiting and Nausea Only   Meperidine Hcl Nausea Only   Statins Other (See Comments)    Severe muscle pain   Codeine Nausea And Vomiting   Hydralazine     neuropathy    Nalbuphine Nausea And Vomiting   Spironolactone     MUSCLE PAIN      Current Outpatient Medications   Medication Sig Dispense Refill   acetaminophen (TYLENOL) 325 MG tablet Take 650 mg by mouth every 6 (six) hours as  needed for moderate pain.     amLODipine (NORVASC) 10 MG tablet TAKE 1 TABLET BY MOUTH EVERY DAY 90 tablet 3   aspirin 81 MG EC tablet Take 81 mg by mouth at bedtime.     cholecalciferol (VITAMIN D3) 25 MCG (1000 UNIT) tablet Take 1,000 Units by mouth daily.     cyclobenzaprine (FLEXERIL) 10 MG tablet Take 10 mg by mouth every 8 (eight) hours as needed.     finasteride (PROSCAR) 5 MG tablet Take 1 tablet (5 mg total) by mouth every evening. 90 tablet 3   furosemide (LASIX) 20 MG tablet      isosorbide mononitrate (IMDUR) 30 MG 24 hr tablet TAKE 1/2 TABLET BY MOUTH EVERY DAY 45 tablet 2   omeprazole (PRILOSEC) 20 MG capsule Take 20 mg by mouth 2 (two) times daily. Before LUNCH and Before SUPPER     polyethylene glycol powder (GLYCOLAX/MIRALAX) 17 GM/SCOOP powder Take 17 g by mouth daily. 255 g 0   pravastatin (PRAVACHOL) 40 MG tablet Take 40 mg by mouth at bedtime.     Probiotic Product (PROBIOTIC DAILY PO) Take 1 capsule by mouth daily.      psyllium (METAMUCIL) 58.6 % powder Take 1 packet by mouth daily.     tamsulosin (FLOMAX) 0.4 MG CAPS capsule Take 0.4 mg by mouth at bedtime.     zolpidem (AMBIEN) 10 MG tablet Take 5 tablets by mouth at bedtime.     No current facility-administered medications for this visit.     Past Surgical History:  Procedure Laterality Date   BREAST BIOPSY Right 08/17/2013   Procedure: RIGHT BREAST BIOPSY;  Surgeon: [name], MD;  Location: [facility];  Service: General;  Laterality: Right;   CHOLECYSTECTOMY  1990s   COLONOSCOPY  03/17/2012   [place]; colonic diverticulosis, repeat screening colonoscopy in 10 years.   CORONARY ARTERY BYPASS GRAFT  04/16/1998   ESOPHAGOGASTRODUODENOSCOPY  08/15/2007   [place]: Food impaction, critical esophageal ring/stricture requiring dilation   HEMORRHOID SURGERY  04/16/2005   total of three   HIATAL HERNIA  REPAIR  04/16/1993   LEFT HEART CATH AND CORS/GRAFTS ANGIOGRAPHY N/A 08/23/2016   Procedure: Left Heart Cath and Cors/Grafts Angiography;  Surgeon: Leonie Man, MD;  Location: [facility];  Service: Cardiovascular;  Laterality: N/A;   LUMBAR LAMINECTOMY  04/16/1997   RETINAL DETACHMENT SURGERY     TEAR DUCT PROBING WITH STRABISMUS REPAIR       Allergies  Allergen Reactions   Meperidine Nausea And Vomiting and Nausea Only   Meperidine Hcl Nausea Only   Statins Other (See Comments)    Severe muscle pain   Codeine Nausea And Vomiting   Hydralazine     neuropathy    Nalbuphine Nausea And Vomiting   Spironolactone     MUSCLE PAIN       Family History  Problem Relation Age of Onset   Coronary artery disease Father        also grandfather   Colon cancer Neg Hx      Social History Mr. Lofgren reports that he has never smoked. He has never used smokeless tobacco. Mr. Crumble reports that he does not currently use alcohol after a past usage of about 2.0 standard drinks of alcohol per week.   Review of Systems CONSTITUTIONAL: No weight loss, fever, chills, weakness or fatigue.  HEENT: Eyes: No visual loss, blurred vision, double vision or yellow sclerae.No hearing loss, sneezing, congestion, runny nose or sore throat.  SKIN: No rash or itching.  CARDIOVASCULAR: per hpi RESPIRATORY: No shortness of breath, cough or sputum.  GASTROINTESTINAL: No anorexia, nausea, vomiting or diarrhea. No abdominal pain or blood.  GENITOURINARY: No burning on urination, no polyuria NEUROLOGICAL: No headache, dizziness, syncope, paralysis, ataxia, numbness or tingling in the extremities. No change in bowel or bladder control.  MUSCULOSKELETAL: No muscle, back pain, joint pain or stiffness.  LYMPHATICS: No enlarged nodes. No history of splenectomy.  PSYCHIATRIC: No history of depression or anxiety.  ENDOCRINOLOGIC: No reports of sweating, cold or heat intolerance. No polyuria or polydipsia.   Marland Kitchen   Physical Examination Today's Vitals   04/24/22 1318  BP: (!) 148/80  Pulse: 82  SpO2: 96%  Weight: 166 lb 12.8 oz (75.7 kg)  Height: '5\' 10"'$  (1.778 m)   Body mass index is 23.93 kg/m.  Gen: resting comfortably, no acute distress HEENT: no scleral icterus, pupils equal round and reactive, no palptable cervical adenopathy,  CV: RRR, no m/r/g no jvd Resp: Clear to auscultation bilaterally GI: abdomen is soft, non-tender, non-distended, normal bowel sounds, no hepatosplenomegaly MSK: extremities are warm, no edema.  Skin: warm, no rash Neuro:  no focal deficits Psych: appropriate affect   Diagnostic Studies  06/2015 echo Study Conclusions   - Left ventricle: The cavity size was normal. Wall thickness was   increased increased in a pattern of mild to moderate LVH.   Systolic function was normal. The estimated ejection fraction was   in the range of 60% to 65%. Wall motion was normal; there were no   regional wall motion abnormalities. Left ventricular diastolic   function parameters were normal. - Aortic valve: Mildly calcified annulus. Trileaflet; mildly   thickened leaflets. Valve area (VTI): 2.58 cm^2. Valve area   (Vmax): 2.89 cm^2. - Mitral valve: There was mild regurgitation. - Systemic veins: Small IVC, suggesting low RA pressures and   hypovolemia. - Technically adequate study.     06/2015 Exercise Stress MPI Blood pressure demonstrated a hypertensive response to exercise. The study is normal. This is a low risk study. The left ventricular ejection fraction is normal (55-65%).     08/2016 cath Mid RCA lesion, 80 %stenosed. Dist RCA lesion, 100 %stenosed. SVG-mRPDA graft was visualized by angiography and is large and anatomically normal. Potential Culpril (not PCI Target). Ost RPDA lesion, 90 %stenosed, Ost RPDA to RPDA lesion, 70 %stenosed.- filled retrograde from SVG Ost 3rd Mrg to 3rd Mrg lesion, 80 %stenosed. SVG-3rd OM graft was visualized by  angiography and is large and anatomically normal. Y Graft SVG-Lat 3rd OM is moderate in size and anatomically normal. Origin lesion, 40 %stenosed. Ost LAD to Prox LAD lesion, 80 %stenosed. Prox LAD to Mid LAD lesion, 99 %stenosed. LIMA graft was visualized by angiography and is normal in caliber and anatomically normal. The left ventricular systolic function is normal. The left ventricular ejection fraction is 55-65% by visual estimate. LV end diastolic pressure is normal.   No obvious culprit lesions found. Clearly has small vessel disease involving the nongrafted diagonal Jeoffrey Eleazer but this is not a very approachable PCI target. The other potential culprit is the severe disease in the retrograde flow from SVG-RPDA back to the PL system. Is also not PCI target.   Recommend: Optimize medical management   Assessment and Plan  1. CAD -no symptoms, continue current meds   2. HTN - Bradycardia on beta blockers, AKI on ACEI, also avoiding diuretic due to renal dysfunction. Allergies to hydralazine and aldactone. He is  on flomax (alpha 1 blocker), could consider talking with urology if could change to doxazosin for more bp effect if needed in the future. . Could consider clonidine. In general given multiple prior side effects he is heistant for new meds -home bp's are reaosnable given limitations, continue current meds   3. Hyperlipidemia -LDL above goal, he is hesitant to increase pravastatin due to concern for side effects. Will add zetia '10mg'$  daily.      Arnoldo Lenis, M.D

## 2022-04-24 NOTE — Patient Instructions (Signed)
Medication Instructions:  Imdur refilled today Begin Zetia '10mg'$  daily  Continue all other medications.     Labwork: none  Testing/Procedures: none  Follow-Up: 6 months   Any Other Special Instructions Will Be Listed Below (If Applicable).   If you need a refill on your cardiac medications before your next appointment, please call your pharmacy.

## 2022-05-03 DIAGNOSIS — H10401 Unspecified chronic conjunctivitis, right eye: Secondary | ICD-10-CM | POA: Diagnosis not present

## 2022-05-03 DIAGNOSIS — H04543 Stenosis of bilateral lacrimal canaliculi: Secondary | ICD-10-CM | POA: Diagnosis not present

## 2022-05-03 DIAGNOSIS — H10402 Unspecified chronic conjunctivitis, left eye: Secondary | ICD-10-CM | POA: Diagnosis not present

## 2022-05-03 DIAGNOSIS — H04223 Epiphora due to insufficient drainage, bilateral lacrimal glands: Secondary | ICD-10-CM | POA: Diagnosis not present

## 2022-05-03 DIAGNOSIS — H04563 Stenosis of bilateral lacrimal punctum: Secondary | ICD-10-CM | POA: Diagnosis not present

## 2022-05-03 DIAGNOSIS — L905 Scar conditions and fibrosis of skin: Secondary | ICD-10-CM | POA: Diagnosis not present

## 2022-05-03 DIAGNOSIS — H04522 Eversion of left lacrimal punctum: Secondary | ICD-10-CM | POA: Diagnosis not present

## 2022-05-03 DIAGNOSIS — H0270 Unspecified degenerative disorders of eyelid and periocular area: Secondary | ICD-10-CM | POA: Diagnosis not present

## 2022-05-03 DIAGNOSIS — H04122 Dry eye syndrome of left lacrimal gland: Secondary | ICD-10-CM | POA: Diagnosis not present

## 2022-05-03 DIAGNOSIS — H02115 Cicatricial ectropion of left lower eyelid: Secondary | ICD-10-CM | POA: Diagnosis not present

## 2022-05-03 DIAGNOSIS — H04553 Acquired stenosis of bilateral nasolacrimal duct: Secondary | ICD-10-CM | POA: Diagnosis not present

## 2022-06-12 DIAGNOSIS — H04522 Eversion of left lacrimal punctum: Secondary | ICD-10-CM | POA: Diagnosis not present

## 2022-06-12 DIAGNOSIS — H0270 Unspecified degenerative disorders of eyelid and periocular area: Secondary | ICD-10-CM | POA: Diagnosis not present

## 2022-06-12 DIAGNOSIS — H04223 Epiphora due to insufficient drainage, bilateral lacrimal glands: Secondary | ICD-10-CM | POA: Diagnosis not present

## 2022-06-12 DIAGNOSIS — H04553 Acquired stenosis of bilateral nasolacrimal duct: Secondary | ICD-10-CM | POA: Diagnosis not present

## 2022-06-12 DIAGNOSIS — H04563 Stenosis of bilateral lacrimal punctum: Secondary | ICD-10-CM | POA: Diagnosis not present

## 2022-06-12 DIAGNOSIS — H02115 Cicatricial ectropion of left lower eyelid: Secondary | ICD-10-CM | POA: Diagnosis not present

## 2022-06-12 DIAGNOSIS — H10402 Unspecified chronic conjunctivitis, left eye: Secondary | ICD-10-CM | POA: Diagnosis not present

## 2022-06-12 DIAGNOSIS — H04543 Stenosis of bilateral lacrimal canaliculi: Secondary | ICD-10-CM | POA: Diagnosis not present

## 2022-06-12 DIAGNOSIS — L905 Scar conditions and fibrosis of skin: Secondary | ICD-10-CM | POA: Diagnosis not present

## 2022-06-12 DIAGNOSIS — H10401 Unspecified chronic conjunctivitis, right eye: Secondary | ICD-10-CM | POA: Diagnosis not present

## 2022-06-12 DIAGNOSIS — H04122 Dry eye syndrome of left lacrimal gland: Secondary | ICD-10-CM | POA: Diagnosis not present

## 2022-06-20 DIAGNOSIS — D485 Neoplasm of uncertain behavior of skin: Secondary | ICD-10-CM | POA: Diagnosis not present

## 2022-06-20 DIAGNOSIS — L568 Other specified acute skin changes due to ultraviolet radiation: Secondary | ICD-10-CM | POA: Diagnosis not present

## 2022-06-20 DIAGNOSIS — Z85828 Personal history of other malignant neoplasm of skin: Secondary | ICD-10-CM | POA: Diagnosis not present

## 2022-06-20 DIAGNOSIS — L821 Other seborrheic keratosis: Secondary | ICD-10-CM | POA: Diagnosis not present

## 2022-06-20 DIAGNOSIS — Z1283 Encounter for screening for malignant neoplasm of skin: Secondary | ICD-10-CM | POA: Diagnosis not present

## 2022-06-20 DIAGNOSIS — Z08 Encounter for follow-up examination after completed treatment for malignant neoplasm: Secondary | ICD-10-CM | POA: Diagnosis not present

## 2022-06-20 DIAGNOSIS — L570 Actinic keratosis: Secondary | ICD-10-CM | POA: Diagnosis not present

## 2022-06-27 DIAGNOSIS — H0011 Chalazion right upper eyelid: Secondary | ICD-10-CM | POA: Diagnosis not present

## 2022-06-27 DIAGNOSIS — H0279 Other degenerative disorders of eyelid and periocular area: Secondary | ICD-10-CM | POA: Diagnosis not present

## 2022-06-27 DIAGNOSIS — Z09 Encounter for follow-up examination after completed treatment for conditions other than malignant neoplasm: Secondary | ICD-10-CM | POA: Diagnosis not present

## 2022-07-11 DIAGNOSIS — M9901 Segmental and somatic dysfunction of cervical region: Secondary | ICD-10-CM | POA: Diagnosis not present

## 2022-07-11 DIAGNOSIS — M546 Pain in thoracic spine: Secondary | ICD-10-CM | POA: Diagnosis not present

## 2022-07-11 DIAGNOSIS — M9902 Segmental and somatic dysfunction of thoracic region: Secondary | ICD-10-CM | POA: Diagnosis not present

## 2022-07-11 DIAGNOSIS — M47812 Spondylosis without myelopathy or radiculopathy, cervical region: Secondary | ICD-10-CM | POA: Diagnosis not present

## 2022-07-11 DIAGNOSIS — M47816 Spondylosis without myelopathy or radiculopathy, lumbar region: Secondary | ICD-10-CM | POA: Diagnosis not present

## 2022-07-11 DIAGNOSIS — M9903 Segmental and somatic dysfunction of lumbar region: Secondary | ICD-10-CM | POA: Diagnosis not present

## 2022-07-16 DIAGNOSIS — M47812 Spondylosis without myelopathy or radiculopathy, cervical region: Secondary | ICD-10-CM | POA: Diagnosis not present

## 2022-07-16 DIAGNOSIS — M47816 Spondylosis without myelopathy or radiculopathy, lumbar region: Secondary | ICD-10-CM | POA: Diagnosis not present

## 2022-07-16 DIAGNOSIS — M9902 Segmental and somatic dysfunction of thoracic region: Secondary | ICD-10-CM | POA: Diagnosis not present

## 2022-07-16 DIAGNOSIS — M546 Pain in thoracic spine: Secondary | ICD-10-CM | POA: Diagnosis not present

## 2022-07-16 DIAGNOSIS — M9903 Segmental and somatic dysfunction of lumbar region: Secondary | ICD-10-CM | POA: Diagnosis not present

## 2022-07-16 DIAGNOSIS — M9901 Segmental and somatic dysfunction of cervical region: Secondary | ICD-10-CM | POA: Diagnosis not present

## 2022-07-30 DIAGNOSIS — I129 Hypertensive chronic kidney disease with stage 1 through stage 4 chronic kidney disease, or unspecified chronic kidney disease: Secondary | ICD-10-CM | POA: Diagnosis not present

## 2022-07-30 DIAGNOSIS — N189 Chronic kidney disease, unspecified: Secondary | ICD-10-CM | POA: Diagnosis not present

## 2022-07-30 DIAGNOSIS — M1990 Unspecified osteoarthritis, unspecified site: Secondary | ICD-10-CM | POA: Diagnosis not present

## 2022-07-30 DIAGNOSIS — Z85828 Personal history of other malignant neoplasm of skin: Secondary | ICD-10-CM | POA: Diagnosis not present

## 2022-07-30 DIAGNOSIS — H04129 Dry eye syndrome of unspecified lacrimal gland: Secondary | ICD-10-CM | POA: Diagnosis not present

## 2022-07-30 DIAGNOSIS — G4700 Insomnia, unspecified: Secondary | ICD-10-CM | POA: Diagnosis not present

## 2022-07-30 DIAGNOSIS — N400 Enlarged prostate without lower urinary tract symptoms: Secondary | ICD-10-CM | POA: Diagnosis not present

## 2022-07-30 DIAGNOSIS — I25119 Atherosclerotic heart disease of native coronary artery with unspecified angina pectoris: Secondary | ICD-10-CM | POA: Diagnosis not present

## 2022-07-30 DIAGNOSIS — K219 Gastro-esophageal reflux disease without esophagitis: Secondary | ICD-10-CM | POA: Diagnosis not present

## 2022-07-30 DIAGNOSIS — Z7982 Long term (current) use of aspirin: Secondary | ICD-10-CM | POA: Diagnosis not present

## 2022-07-30 DIAGNOSIS — E785 Hyperlipidemia, unspecified: Secondary | ICD-10-CM | POA: Diagnosis not present

## 2022-07-30 DIAGNOSIS — K5900 Constipation, unspecified: Secondary | ICD-10-CM | POA: Diagnosis not present

## 2022-08-30 DIAGNOSIS — C44329 Squamous cell carcinoma of skin of other parts of face: Secondary | ICD-10-CM | POA: Diagnosis not present

## 2022-09-04 DIAGNOSIS — N1832 Chronic kidney disease, stage 3b: Secondary | ICD-10-CM | POA: Diagnosis not present

## 2022-09-04 DIAGNOSIS — N200 Calculus of kidney: Secondary | ICD-10-CM | POA: Diagnosis not present

## 2022-09-06 DIAGNOSIS — N200 Calculus of kidney: Secondary | ICD-10-CM | POA: Diagnosis not present

## 2022-09-06 DIAGNOSIS — D631 Anemia in chronic kidney disease: Secondary | ICD-10-CM | POA: Diagnosis not present

## 2022-09-06 DIAGNOSIS — N1831 Chronic kidney disease, stage 3a: Secondary | ICD-10-CM | POA: Diagnosis not present

## 2022-09-06 DIAGNOSIS — Z79899 Other long term (current) drug therapy: Secondary | ICD-10-CM | POA: Diagnosis not present

## 2022-09-06 DIAGNOSIS — R809 Proteinuria, unspecified: Secondary | ICD-10-CM | POA: Diagnosis not present

## 2022-09-06 DIAGNOSIS — I129 Hypertensive chronic kidney disease with stage 1 through stage 4 chronic kidney disease, or unspecified chronic kidney disease: Secondary | ICD-10-CM | POA: Diagnosis not present

## 2022-09-06 DIAGNOSIS — R600 Localized edema: Secondary | ICD-10-CM | POA: Diagnosis not present

## 2022-09-06 DIAGNOSIS — N281 Cyst of kidney, acquired: Secondary | ICD-10-CM | POA: Diagnosis not present

## 2022-09-12 DIAGNOSIS — M1711 Unilateral primary osteoarthritis, right knee: Secondary | ICD-10-CM | POA: Diagnosis not present

## 2022-09-13 DIAGNOSIS — N1831 Chronic kidney disease, stage 3a: Secondary | ICD-10-CM | POA: Diagnosis not present

## 2022-09-13 DIAGNOSIS — N200 Calculus of kidney: Secondary | ICD-10-CM | POA: Diagnosis not present

## 2022-09-13 DIAGNOSIS — E559 Vitamin D deficiency, unspecified: Secondary | ICD-10-CM | POA: Diagnosis not present

## 2022-09-27 DIAGNOSIS — M1711 Unilateral primary osteoarthritis, right knee: Secondary | ICD-10-CM | POA: Diagnosis not present

## 2022-10-02 DIAGNOSIS — I10 Essential (primary) hypertension: Secondary | ICD-10-CM | POA: Diagnosis not present

## 2022-10-02 DIAGNOSIS — E119 Type 2 diabetes mellitus without complications: Secondary | ICD-10-CM | POA: Diagnosis not present

## 2022-10-02 DIAGNOSIS — Z6825 Body mass index (BMI) 25.0-25.9, adult: Secondary | ICD-10-CM | POA: Diagnosis not present

## 2022-10-02 DIAGNOSIS — N183 Chronic kidney disease, stage 3 unspecified: Secondary | ICD-10-CM | POA: Diagnosis not present

## 2022-10-02 DIAGNOSIS — N3281 Overactive bladder: Secondary | ICD-10-CM | POA: Diagnosis not present

## 2022-10-02 DIAGNOSIS — E782 Mixed hyperlipidemia: Secondary | ICD-10-CM | POA: Diagnosis not present

## 2022-10-02 DIAGNOSIS — E663 Overweight: Secondary | ICD-10-CM | POA: Diagnosis not present

## 2022-10-02 DIAGNOSIS — I2510 Atherosclerotic heart disease of native coronary artery without angina pectoris: Secondary | ICD-10-CM | POA: Diagnosis not present

## 2022-10-02 DIAGNOSIS — G4700 Insomnia, unspecified: Secondary | ICD-10-CM | POA: Diagnosis not present

## 2022-10-03 DIAGNOSIS — H0011 Chalazion right upper eyelid: Secondary | ICD-10-CM | POA: Diagnosis not present

## 2022-10-03 DIAGNOSIS — M1711 Unilateral primary osteoarthritis, right knee: Secondary | ICD-10-CM | POA: Diagnosis not present

## 2022-10-03 DIAGNOSIS — H04543 Stenosis of bilateral lacrimal canaliculi: Secondary | ICD-10-CM | POA: Diagnosis not present

## 2022-10-03 DIAGNOSIS — H04563 Stenosis of bilateral lacrimal punctum: Secondary | ICD-10-CM | POA: Diagnosis not present

## 2022-10-03 DIAGNOSIS — H04123 Dry eye syndrome of bilateral lacrimal glands: Secondary | ICD-10-CM | POA: Diagnosis not present

## 2022-10-03 DIAGNOSIS — H04223 Epiphora due to insufficient drainage, bilateral lacrimal glands: Secondary | ICD-10-CM | POA: Diagnosis not present

## 2022-10-11 DIAGNOSIS — M1711 Unilateral primary osteoarthritis, right knee: Secondary | ICD-10-CM | POA: Diagnosis not present

## 2022-10-12 ENCOUNTER — Other Ambulatory Visit: Payer: Self-pay | Admitting: Cardiology

## 2022-10-15 DIAGNOSIS — E7849 Other hyperlipidemia: Secondary | ICD-10-CM | POA: Diagnosis not present

## 2022-10-15 DIAGNOSIS — E119 Type 2 diabetes mellitus without complications: Secondary | ICD-10-CM | POA: Diagnosis not present

## 2022-10-19 ENCOUNTER — Other Ambulatory Visit: Payer: Self-pay | Admitting: Cardiology

## 2022-10-29 ENCOUNTER — Encounter (INDEPENDENT_AMBULATORY_CARE_PROVIDER_SITE_OTHER): Payer: Medicare HMO | Admitting: Ophthalmology

## 2022-10-29 DIAGNOSIS — H35342 Macular cyst, hole, or pseudohole, left eye: Secondary | ICD-10-CM

## 2022-10-29 DIAGNOSIS — H43813 Vitreous degeneration, bilateral: Secondary | ICD-10-CM

## 2022-10-29 DIAGNOSIS — H35033 Hypertensive retinopathy, bilateral: Secondary | ICD-10-CM | POA: Diagnosis not present

## 2022-10-29 DIAGNOSIS — I10 Essential (primary) hypertension: Secondary | ICD-10-CM | POA: Diagnosis not present

## 2022-10-29 DIAGNOSIS — H33321 Round hole, right eye: Secondary | ICD-10-CM

## 2022-10-30 ENCOUNTER — Ambulatory Visit: Payer: Medicare HMO | Attending: Cardiology | Admitting: Cardiology

## 2022-10-30 ENCOUNTER — Encounter: Payer: Self-pay | Admitting: Cardiology

## 2022-10-30 VITALS — BP 134/65 | HR 65 | Ht 69.0 in | Wt 162.2 lb

## 2022-10-30 DIAGNOSIS — I2510 Atherosclerotic heart disease of native coronary artery without angina pectoris: Secondary | ICD-10-CM | POA: Diagnosis not present

## 2022-10-30 DIAGNOSIS — E782 Mixed hyperlipidemia: Secondary | ICD-10-CM | POA: Diagnosis not present

## 2022-10-30 DIAGNOSIS — I10 Essential (primary) hypertension: Secondary | ICD-10-CM

## 2022-10-30 NOTE — Patient Instructions (Signed)
Medication Instructions:  Continue all other medications.     Labwork: none  Testing/Procedures: none  Follow-Up: 6 months   Any Other Special Instructions Will Be Listed Below (If Applicable).   If you need a refill on your cardiac medications before your next appointment, please call your pharmacy.  

## 2022-10-30 NOTE — Progress Notes (Signed)
Clinical Summary [name] is a 77 y.o.male seen today for follow up of the following medical problems.    1. CAD - CABG in 2000 - 06/2015 completed echo which showed normal LVEF. Exercise MPI he did 8 min 25 sec with no EKG changes and no ischemia by imaging. - has not been on ACE-I since prior admission with AKI, had also been on NSAIDs at that time.    - admit 08/2016 with NSTEMI, cath as reported below, no interventional targets, managed medically. Imdur added to medical regimen and lopressor 12.5mg  bid added.   - lopressor later stopped due to bradycardia     - no chest pains, no SOB/DOE - compliant with meds    - recently walked 2 miles a day while [place] the beach without exertional symptoms - no chest pain, no SOB/DOE - compliant with meds       2. HTN - metoprolol stopped due to low HR's in 09/2017 - prior AKI on ACE-I, though heavy NSAID use at the time also -listed hydralazine, aldactone allergies.      - we previously increased norvasc to 10mg  daily. Reports some prior swelling in the past on norvasc but has tolerated reasonablly well since then - reports high sodium intake around the time of swelling. After about a day the swelling resolved on its own.      - home bp's 130s-140s/70s-80s, typically 130s/60s   Home bp's 130s-140s/60s-70s - compliant with meds   3. Hyperlipidemia - leg pains on vytorin. He is on pravastatin 03/2022 TC 177 TG 99 HDL 57 LDL 102 - he is concerned about higher doses of statin    10/2022 TC 148 TG 95 HDL 55 LDL 75   4. CKD - followed by nephrology by Dr Wolfgang Phoenix  Last Cr up to 1.22 02/2022 1.22   5. LE edema -imprioved with prn lasix, had recent varicose vein procedure which helped alot -   6. Varicose veins - prior intervention     Past Medical History:  Diagnosis Date   Arteriosclerotic cardiovascular disease (ASCVD)    CABG in 08/1998  (LIMA-mLAD, SVG-rPDA, SVG-OM3-with Y SVG-lateral OM3.)   Atypical nevus  11/29/2005   slight atypia - left hand   Basal cell carcinoma of skin 03/22/2004   Left scapula, superior - CX3 + 5FU   Basal cell carcinoma of skin 03/22/2004   Left scapula, inferior - CX3 + excision   Basal cell carcinoma of skin 03/22/2004   Center mid back - CX3 + 5FU   Basal cell carcinoma of skin 07/24/2004   Left shin - CX3 + 5FU   Basal cell carcinoma of skin 11/29/2005   Left shoulder - CX3 + 5FU   Basal cell carcinoma of skin 10/22/2006   ulcerated on right outer knee - CX3+5FU   Basal cell carcinoma of skin 12/02/2008   left upper arm, superior - CX3+5FU   Basal cell carcinoma of skin 05/16/2011   right shoulder   Basal cell carcinoma of skin 05/16/2011   ulcerated above left sideburn   Basal cell carcinoma of skin 05/16/2011   lateral left arm   Basal cell carcinoma of skin 06/29/2015   nodular on left ear - tx p bx   Basal cell carcinoma of skin 06/08/2016   superficial on left scapha, ear (MOHs)   Basal cell carcinoma of skin 07/25/2017   nodular on left ear (MOHS)   Basal cell carcinoma of skin 07/25/2017   nodular  on left thigh - tx p bx   Basal cell carcinoma of skin 11/18/2018   nodular on right nare - tx p bx   BCC (basal cell carcinoma of skin) 08/25/2019   left upper arm anterior (CX35FU)   BCC (basal cell carcinoma of skin) 08/25/2019   right chin   BPH (benign prostatic hypertrophy)    h/x of prostatis/ UTI in 2006   Chronic kidney disease, stage II (mild) 2010   Creatinine of 1.5 in 2010   GERD (gastroesophageal reflux disease)    s/p espohageal dilatatin and repair of hiatal hernia   Hyperlipidemia    Hypertension    IBS (irritable bowel syndrome)    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   Neck-posterior   PONV (postoperative nausea and vomiting)    Retinal tear of right eye    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   right buccal cheek (CX35FU)   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   right elbow(CX35FU)   SCCA (squamous  cell carcinoma) of skin 01/01/2018   Right Forearm Upper(well diff) (tx p bx)   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   Right Index Finger(in situ) (tx p bx)   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   Left Forearm,upper(in situ) (tx p bx)   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/18/2018   Base of Left Middle Finger(in situ) (tx p bx)   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/18/2018   Right Outer Forearm,sup(in situ) (tx p bx)   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/18/2018   Right Forearm,Inf(in situ) (tx p bx)   Sigmoid diverticulosis    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   in situ on right hand - CX3 + 5FU   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   in situ on right ear - CX3 + 5FU   Squamous cell carcinoma of skin 01/21/2006   in situ on upper mid back - tx p bx   Squamous cell carcinoma of skin 01/21/2006   in situ on top left shoulder - tx p bx   Squamous cell carcinoma of skin 10/22/2006   KA under left eye - MOHs   Squamous cell carcinoma of skin 12/02/2008   in situ on left base of 4th finger - CX3+5FU   Squamous cell carcinoma of skin 08/30/2009   well differentiated on right jawline   Squamous cell carcinoma of skin 08/30/2009   in situ on left upper arm - CX3+imiquimod   Squamous cell carcinoma of skin 02/25/2014   in situ on left temple   Squamous cell carcinoma of skin 02/25/2014   in situ on left arm   Squamous cell carcinoma of skin 06/08/2016   in situ on left lower side neck - tx p bx   Squamous cell carcinoma of skin 06/08/2016   left shin - tx p bx   Squamous cell carcinoma of skin 06/08/2016   in situ on right temple - tx p bx   Squamous cell carcinoma of skin 06/08/2016   in situ on right jawline - tx p bx     Allergies  Allergen Reactions   Meperidine Nausea And Vomiting and Nausea Only   Meperidine Hcl Nausea Only   Statins Other (See Comments)    Severe muscle pain   Codeine Nausea And Vomiting    Hydralazine     neuropathy    Nalbuphine Nausea And Vomiting   Spironolactone     MUSCLE PAIN  Current Outpatient Medications  Medication Sig Dispense Refill   acetaminophen (TYLENOL) 325 MG tablet Take 650 mg by mouth every 6 (six) hours as needed for moderate pain.     amLODipine (NORVASC) 10 MG tablet TAKE 1 TABLET BY MOUTH EVERY DAY 90 tablet 2   aspirin 81 MG EC tablet Take 81 mg by mouth at bedtime.     cyclobenzaprine (FLEXERIL) 10 MG tablet Take 10 mg by mouth every 8 (eight) hours as needed.     ezetimibe (ZETIA) 10 MG tablet TAKE 1 TABLET BY MOUTH EVERY DAY 90 tablet 2   finasteride (PROSCAR) 5 MG tablet Take 1 tablet (5 mg total) by mouth every evening. 90 tablet 3   isosorbide mononitrate (IMDUR) 30 MG 24 hr tablet Take 0.5 tablets (15 mg total) by mouth daily. 45 tablet 3   omeprazole (PRILOSEC) 20 MG capsule Take 20 mg by mouth 2 (two) times daily. Before LUNCH and Before SUPPER     polyethylene glycol powder (GLYCOLAX/MIRALAX) 17 GM/SCOOP powder Take 17 g by mouth daily. 255 g 0   pravastatin (PRAVACHOL) 40 MG tablet Take 40 mg by mouth at bedtime.     Probiotic Product (PROBIOTIC DAILY PO) Take 1 capsule by mouth daily.      psyllium (METAMUCIL) 58.6 % powder Take 1 packet by mouth daily.     tamsulosin (FLOMAX) 0.4 MG CAPS capsule Take 0.4 mg by mouth at bedtime.     zolpidem (AMBIEN) 10 MG tablet Take 5 tablets by mouth at bedtime.     No current facility-administered medications for this visit.     Past Surgical History:  Procedure Laterality Date   BREAST BIOPSY Right 08/17/2013   Procedure: RIGHT BREAST BIOPSY;  Surgeon: Dalia Heading, MD;  Location: [facility];  Service: General;  Laterality: Right;   CHOLECYSTECTOMY  1990s   COLONOSCOPY  03/17/2012   Rourk; colonic diverticulosis, repeat screening colonoscopy in 10 years.   CORONARY ARTERY BYPASS GRAFT  04/16/1998   ESOPHAGOGASTRODUODENOSCOPY  08/15/2007   Rourk: Food impaction, critical esophageal  ring/stricture requiring dilation   HEMORRHOID SURGERY  04/16/2005   total of three   HIATAL HERNIA REPAIR  04/16/1993   LEFT HEART CATH AND CORS/GRAFTS ANGIOGRAPHY N/A 08/23/2016   Procedure: Left Heart Cath and Cors/Grafts Angiography;  Surgeon: Marykay Lex, MD;  Location: [facility] INVASIVE CV LAB;  Service: Cardiovascular;  Laterality: N/A;   LUMBAR LAMINECTOMY  04/16/1997   RETINAL DETACHMENT SURGERY     TEAR DUCT PROBING WITH STRABISMUS REPAIR       Allergies  Allergen Reactions   Meperidine Nausea And Vomiting and Nausea Only   Meperidine Hcl Nausea Only   Statins Other (See Comments)    Severe muscle pain   Codeine Nausea And Vomiting   Hydralazine     neuropathy    Nalbuphine Nausea And Vomiting   Spironolactone     MUSCLE PAIN       Family History  Problem Relation Age of Onset   Coronary artery disease Father        also grandfather   Colon cancer Neg Hx      Social History [name] reports that he has never smoked. He has never used smokeless tobacco. [name] reports that he does not currently use alcohol after a past usage of about 2.0 standard drinks of alcohol per week.   Review of Systems CONSTITUTIONAL: No weight loss, fever, chills, weakness or fatigue.  HEENT: Eyes: No visual loss, blurred  vision, double vision or yellow sclerae.No hearing loss, sneezing, congestion, runny nose or sore throat.  SKIN: No rash or itching.  CARDIOVASCULAR: per hpi RESPIRATORY: No shortness of breath, cough or sputum.  GASTROINTESTINAL: No anorexia, nausea, vomiting or diarrhea. No abdominal pain or blood.  GENITOURINARY: No burning on urination, no polyuria NEUROLOGICAL: No headache, dizziness, syncope, paralysis, ataxia, numbness or tingling in the extremities. No change in bowel or bladder control.  MUSCULOSKELETAL: No muscle, back pain, joint pain or stiffness.  LYMPHATICS: No enlarged nodes. No history of splenectomy.  PSYCHIATRIC: No history of depression or  anxiety.  ENDOCRINOLOGIC: No reports of sweating, cold or heat intolerance. No polyuria or polydipsia.  Marland Kitchen   Physical Examination Today's Vitals   10/30/22 1254  BP: 138/72  Pulse: 65  SpO2: 99%  Weight: 162 lb 3.2 oz (73.6 kg)  Height: 5\' 9"  (1.753 m)   Body mass index is 23.95 kg/m.  Gen: resting comfortably, no acute distress HEENT: no scleral icterus, pupils equal round and reactive, no palptable cervical adenopathy,  CV: RRR, no m/rg, no jvd Resp: Clear to auscultation bilaterally GI: abdomen is soft, non-tender, non-distended, normal bowel sounds, no hepatosplenomegaly MSK: extremities are warm, no edema.  Skin: warm, no rash Neuro:  no focal deficits Psych: appropriate affect   Diagnostic Studies  06/2015 echo Study Conclusions   - Left ventricle: The cavity size was normal. Wall thickness was   increased increased in a pattern of mild to moderate LVH.   Systolic function was normal. The estimated ejection fraction was   in the range of 60% to 65%. Wall motion was normal; there were no   regional wall motion abnormalities. Left ventricular diastolic   function parameters were normal. - Aortic valve: Mildly calcified annulus. Trileaflet; mildly   thickened leaflets. Valve area (VTI): 2.58 cm^2. Valve area   (Vmax): 2.89 cm^2. - Mitral valve: There was mild regurgitation. - Systemic veins: Small IVC, suggesting low RA pressures and   hypovolemia. - Technically adequate study.     06/2015 Exercise Stress MPI Blood pressure demonstrated a hypertensive response to exercise. The study is normal. This is a low risk study. The left ventricular ejection fraction is normal (55-65%).     08/2016 cath Mid RCA lesion, 80 %stenosed. Dist RCA lesion, 100 %stenosed. SVG-mRPDA graft was visualized by angiography and is large and anatomically normal. Potential Culpril (not PCI Target). Ost RPDA lesion, 90 %stenosed, Ost RPDA to RPDA lesion, 70 %stenosed.- filled retrograde  from SVG Ost 3rd Mrg to 3rd Mrg lesion, 80 %stenosed. SVG-3rd OM graft was visualized by angiography and is large and anatomically normal. Y Graft SVG-Lat 3rd OM is moderate in size and anatomically normal. Origin lesion, 40 %stenosed. Ost LAD to Prox LAD lesion, 80 %stenosed. Prox LAD to Mid LAD lesion, 99 %stenosed. LIMA graft was visualized by angiography and is normal in caliber and anatomically normal. The left ventricular systolic function is normal. The left ventricular ejection fraction is 55-65% by visual estimate. LV end diastolic pressure is normal.   No obvious culprit lesions found. Clearly has small vessel disease involving the nongrafted diagonal Samina Weekes but this is not a very approachable PCI target. The other potential culprit is the severe disease in the retrograde flow from SVG-RPDA back to the PL system. Is also not PCI target.   Recommend: Optimize medical management       Assessment and Plan  1. CAD - no symptoms, continue current meds   2. HTN - Bradycardia  on beta blockers, AKI on ACEI, also avoiding diuretic due to renal dysfunction. Allergies to hydralazine and aldactone. He is on flomax (alpha 1 blocker), could consider talking with urology if could change to doxazosin for more bp effect if needed in the future. . Could consider clonidine. In general given multiple prior side effects he is heistant for new meds  - bp's reasonable particularly given limitations on medication options, continue current meds   3. Hyperlipidemia -he is hesitant to increase pravastatin due to concern for side effects. - last visit we added zetia 10mg  daily, LDL down to 75. Continue current therapy.         Antoine Poche, M.D..

## 2023-01-01 DIAGNOSIS — Z08 Encounter for follow-up examination after completed treatment for malignant neoplasm: Secondary | ICD-10-CM | POA: Diagnosis not present

## 2023-01-01 DIAGNOSIS — L538 Other specified erythematous conditions: Secondary | ICD-10-CM | POA: Diagnosis not present

## 2023-01-01 DIAGNOSIS — L298 Other pruritus: Secondary | ICD-10-CM | POA: Diagnosis not present

## 2023-01-01 DIAGNOSIS — Z85828 Personal history of other malignant neoplasm of skin: Secondary | ICD-10-CM | POA: Diagnosis not present

## 2023-01-01 DIAGNOSIS — R58 Hemorrhage, not elsewhere classified: Secondary | ICD-10-CM | POA: Diagnosis not present

## 2023-01-01 DIAGNOSIS — R208 Other disturbances of skin sensation: Secondary | ICD-10-CM | POA: Diagnosis not present

## 2023-01-01 DIAGNOSIS — L821 Other seborrheic keratosis: Secondary | ICD-10-CM | POA: Diagnosis not present

## 2023-01-01 DIAGNOSIS — L570 Actinic keratosis: Secondary | ICD-10-CM | POA: Diagnosis not present

## 2023-01-01 DIAGNOSIS — L568 Other specified acute skin changes due to ultraviolet radiation: Secondary | ICD-10-CM | POA: Diagnosis not present

## 2023-01-01 DIAGNOSIS — D485 Neoplasm of uncertain behavior of skin: Secondary | ICD-10-CM | POA: Diagnosis not present

## 2023-01-01 DIAGNOSIS — Z1283 Encounter for screening for malignant neoplasm of skin: Secondary | ICD-10-CM | POA: Diagnosis not present

## 2023-02-12 DIAGNOSIS — N183 Chronic kidney disease, stage 3 unspecified: Secondary | ICD-10-CM | POA: Diagnosis not present

## 2023-02-12 DIAGNOSIS — K589 Irritable bowel syndrome without diarrhea: Secondary | ICD-10-CM | POA: Diagnosis not present

## 2023-02-12 DIAGNOSIS — Z6824 Body mass index (BMI) 24.0-24.9, adult: Secondary | ICD-10-CM | POA: Diagnosis not present

## 2023-02-13 NOTE — Progress Notes (Unsigned)
Referring Provider: Elfredia Nevins, MD  Primary Care Physician:  Elfredia Nevins, MD Primary Gastroenterologist:  Dr. Jena Gauss  No chief complaint on file.   HPI:   [name] is a 77 y.o. male presenting today for follow-up of IBS. ***  Patient has not been seen in this office since 2016 when he was seen by Dr. Jena Gauss.  He was following up on postprandial abdominal cramping and diarrhea.  Noted celiac screen previously negative and he had previously reported diarrhea got worse on Xifaxan, then got better and he had done well on Metamucil daily and over-the-counter probiotic.  Only 3 episodes of nonbloody diarrhea in the last 3 months.  GERD was well-controlled on omeprazole 20 mg twice daily.  He was advised to continue probiotic, Metamucil, and to use Levsin prn.   Today:    Past Medical History:  Diagnosis Date   Arteriosclerotic cardiovascular disease (ASCVD)    CABG in 08/1998  (LIMA-mLAD, SVG-rPDA, SVG-OM3-with Y SVG-lateral OM3.)   Atypical nevus 11/29/2005   slight atypia - left hand   Basal cell carcinoma of skin 03/22/2004   Left scapula, superior - CX3 + 5FU   Basal cell carcinoma of skin 03/22/2004   Left scapula, inferior - CX3 + excision   Basal cell carcinoma of skin 03/22/2004   Center mid back - CX3 + 5FU   Basal cell carcinoma of skin 07/24/2004   Left shin - CX3 + 5FU   Basal cell carcinoma of skin 11/29/2005   Left shoulder - CX3 + 5FU   Basal cell carcinoma of skin 10/22/2006   ulcerated on right outer knee - CX3+5FU   Basal cell carcinoma of skin 12/02/2008   left upper arm, superior - CX3+5FU   Basal cell carcinoma of skin 05/16/2011   right shoulder   Basal cell carcinoma of skin 05/16/2011   ulcerated above left sideburn   Basal cell carcinoma of skin 05/16/2011   lateral left arm   Basal cell carcinoma of skin 06/29/2015   nodular on left ear - tx p bx   Basal cell carcinoma of skin 06/08/2016   superficial on left scapha, ear (MOHs)    Basal cell carcinoma of skin 07/25/2017   nodular on left ear (MOHS)   Basal cell carcinoma of skin 07/25/2017   nodular on left thigh - tx p bx   Basal cell carcinoma of skin 11/18/2018   nodular on right nare - tx p bx   BCC (basal cell carcinoma of skin) 08/25/2019   left upper arm anterior (CX35FU)   BCC (basal cell carcinoma of skin) 08/25/2019   right chin   BPH (benign prostatic hypertrophy)    h/x of prostatis/ UTI in 2006   Chronic kidney disease, stage II (mild) 2010   Creatinine of 1.5 in 2010   GERD (gastroesophageal reflux disease)    s/p espohageal dilatatin and repair of hiatal hernia   Hyperlipidemia    Hypertension    IBS (irritable bowel syndrome)    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   Neck-posterior   PONV (postoperative nausea and vomiting)    Retinal tear of right eye    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   right buccal cheek (CX35FU)   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   right elbow(CX35FU)   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   Right Forearm Upper(well diff) (tx p bx)   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   Right Index Finger(in situ) (tx  p bx)   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   Left Forearm,upper(in situ) (tx p bx)   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/18/2018   Base of Left Middle Finger(in situ) (tx p bx)   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/18/2018   Right Outer Forearm,sup(in situ) (tx p bx)   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/18/2018   Right Forearm,Inf(in situ) (tx p bx)   Sigmoid diverticulosis    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   in situ on right hand - CX3 + 5FU   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   in situ on right ear - CX3 + 5FU   Squamous cell carcinoma of skin 01/21/2006   in situ on upper mid back - tx p bx   Squamous cell carcinoma of skin 01/21/2006   in situ on top left shoulder - tx p bx   Squamous cell carcinoma  of skin 10/22/2006   KA under left eye - MOHs   Squamous cell carcinoma of skin 12/02/2008   in situ on left base of 4th finger - CX3+5FU   Squamous cell carcinoma of skin 08/30/2009   well differentiated on right jawline   Squamous cell carcinoma of skin 08/30/2009   in situ on left upper arm - CX3+imiquimod   Squamous cell carcinoma of skin 02/25/2014   in situ on left temple   Squamous cell carcinoma of skin 02/25/2014   in situ on left arm   Squamous cell carcinoma of skin 06/08/2016   in situ on left lower side neck - tx p bx   Squamous cell carcinoma of skin 06/08/2016   left shin - tx p bx   Squamous cell carcinoma of skin 06/08/2016   in situ on right temple - tx p bx   Squamous cell carcinoma of skin 06/08/2016   in situ on right jawline - tx p bx    Past Surgical History:  Procedure Laterality Date   BREAST BIOPSY Right 08/17/2013   Procedure: RIGHT BREAST BIOPSY;  Surgeon: Dalia Heading, MD;  Location: [facility];  Service: General;  Laterality: Right;   CHOLECYSTECTOMY  1990s   COLONOSCOPY  03/17/2012   Rourk; colonic diverticulosis, repeat screening colonoscopy in 10 years.   CORONARY ARTERY BYPASS GRAFT  04/16/1998   ESOPHAGOGASTRODUODENOSCOPY  08/15/2007   Rourk: Food impaction, critical esophageal ring/stricture requiring dilation   HEMORRHOID SURGERY  04/16/2005   total of three   HIATAL HERNIA REPAIR  04/16/1993   LEFT HEART CATH AND CORS/GRAFTS ANGIOGRAPHY N/A 08/23/2016   Procedure: Left Heart Cath and Cors/Grafts Angiography;  Surgeon: Marykay Lex, MD;  Location: [facility] INVASIVE CV LAB;  Service: Cardiovascular;  Laterality: N/A;   LUMBAR LAMINECTOMY  04/16/1997   RETINAL DETACHMENT SURGERY     TEAR DUCT PROBING WITH STRABISMUS REPAIR      Current Outpatient Medications  Medication Sig Dispense Refill   acetaminophen (TYLENOL) 325 MG tablet Take 650 mg by mouth every 6 (six) hours as needed for moderate pain.     amLODipine (NORVASC) 10 MG tablet TAKE 1  TABLET BY MOUTH EVERY DAY 90 tablet 2   aspirin 81 MG EC tablet Take 81 mg by mouth at bedtime.     cyclobenzaprine (FLEXERIL) 10 MG tablet Take 10 mg by mouth every 8 (eight) hours as needed.     ezetimibe (ZETIA) 10 MG tablet TAKE 1 TABLET BY MOUTH EVERY DAY 90 tablet 2   finasteride (  PROSCAR) 5 MG tablet Take 1 tablet (5 mg total) by mouth every evening. 90 tablet 3   isosorbide mononitrate (IMDUR) 30 MG 24 hr tablet Take 0.5 tablets (15 mg total) by mouth daily. 45 tablet 3   omeprazole (PRILOSEC) 20 MG capsule Take 20 mg by mouth 2 (two) times daily. Before LUNCH and Before SUPPER     polyethylene glycol powder (GLYCOLAX/MIRALAX) 17 GM/SCOOP powder Take 17 g by mouth daily. 255 g 0   pravastatin (PRAVACHOL) 40 MG tablet Take 40 mg by mouth at bedtime.     Probiotic Product (PROBIOTIC DAILY PO) Take 1 capsule by mouth daily.      psyllium (METAMUCIL) 58.6 % powder Take 1 packet by mouth daily.     RESTASIS 0.05 % ophthalmic emulsion Place 1 drop into both eyes 2 (two) times daily.     tamsulosin (FLOMAX) 0.4 MG CAPS capsule Take 0.4 mg by mouth at bedtime.     zolpidem (AMBIEN) 10 MG tablet Take 5 tablets by mouth at bedtime.     No current facility-administered medications for this visit.    Allergies as of 02/14/2023 - Review Complete 10/30/2022  Allergen Reaction Noted   Meperidine Nausea And Vomiting and Nausea Only 03/04/2012   Meperidine hcl Nausea Only 06/21/2020   Statins Other (See Comments) 07/23/2016   Codeine Nausea And Vomiting 04/05/2009   Hydralazine  05/06/2019   Nalbuphine Nausea And Vomiting 04/05/2009   Spironolactone  06/22/2019    Family History  Problem Relation Age of Onset   Coronary artery disease Father        also grandfather   Colon cancer Neg Hx     Social History   Socioeconomic History   Marital status: Married    Spouse name: Not on file   Number of children: Not on file   Years of education: Not on file   Highest education level: Not on  file  Occupational History   Occupation: Public affairs consultant    Employer: [employer]  Tobacco Use   Smoking status: Never   Smokeless tobacco: Never   Tobacco comments:    no use of tobacco products  Vaping Use   Vaping status: Never Used  Substance and Sexual Activity   Alcohol use: Not Currently    Alcohol/week: 2.0 standard drinks of alcohol    Types: 2 Standard drinks or equivalent per week    Comment: "Occasionally"   Drug use: Never   Sexual activity: Yes    Partners: Female  Other Topics Concern   Not on file  Social History Narrative   Previously  Employed as an Public affairs consultant.    Social Determinants of Health   Financial Resource Strain: Not on file  Food Insecurity: Not on file  Transportation Needs: Not on file  Physical Activity: Not on file  Stress: Not on file  Social Connections: Not on file  Intimate Partner Violence: Not on file    Review of Systems: Gen: Denies any fever, chills, fatigue, weight loss, lack of appetite.  CV: Denies chest pain, heart palpitations, peripheral edema, syncope.  Resp: Denies shortness of breath at rest or with exertion. Denies wheezing or cough.  GI: Denies dysphagia or odynophagia. Denies jaundice, hematemesis, fecal incontinence. GU : Denies urinary burning, urinary frequency, urinary hesitancy MS: Denies joint pain, muscle weakness, cramps, or limitation of movement.  Derm: Denies rash, itching, dry skin Psych: Denies depression, anxiety, memory loss, and confusion Heme: Denies bruising, bleeding, and enlarged lymph nodes.  Physical Exam: There were no vitals taken for this visit. General:   Alert and oriented. Pleasant and cooperative. Well-nourished and well-developed.  Head:  Normocephalic and atraumatic. Eyes:  Without icterus, sclera clear and conjunctiva pink.  Ears:  Normal auditory acuity. Lungs:  Clear to auscultation bilaterally. No wheezes, rales, or rhonchi. No distress.  Heart:  S1, S2  present without murmurs appreciated.  Abdomen:  +BS, soft, non-tender and non-distended. No HSM noted. No guarding or rebound. No masses appreciated.  Rectal:  Deferred  Msk:  Symmetrical without gross deformities. Normal posture. Extremities:  Without edema. Neurologic:  Alert and  oriented x4;  grossly normal neurologically. Skin:  Intact without significant lesions or rashes. Psych:  Alert and cooperative. Normal mood and affect.    Assessment:     Plan:  ***   Ermalinda Memos, PA-C [facility] 02/14/2023

## 2023-02-14 ENCOUNTER — Ambulatory Visit: Payer: Medicare HMO | Admitting: Gastroenterology

## 2023-02-14 ENCOUNTER — Encounter: Payer: Self-pay | Admitting: Gastroenterology

## 2023-02-14 VITALS — BP 137/68 | HR 61 | Temp 98.0°F | Ht 68.0 in | Wt 163.4 lb

## 2023-02-14 DIAGNOSIS — K582 Mixed irritable bowel syndrome: Secondary | ICD-10-CM

## 2023-02-14 NOTE — Patient Instructions (Signed)
I suspect you are currently dealing with baseline constipation with some overflow diarrhea.  I would like for you to start taking MiraLAX 17 g every day to allow you to have good productive bowel movements on a daily basis.  If you continue to have intermittent flares of diarrhea despite having productive bowel movements daily, please let me know.  We will plan to follow-up in the office in 3 months or sooner if needed.  It was very nice to meet you today!  Ermalinda Memos, PA-C [facility]

## 2023-02-18 DIAGNOSIS — E211 Secondary hyperparathyroidism, not elsewhere classified: Secondary | ICD-10-CM | POA: Diagnosis not present

## 2023-02-18 DIAGNOSIS — D631 Anemia in chronic kidney disease: Secondary | ICD-10-CM | POA: Diagnosis not present

## 2023-02-18 DIAGNOSIS — M109 Gout, unspecified: Secondary | ICD-10-CM | POA: Diagnosis not present

## 2023-02-18 DIAGNOSIS — E559 Vitamin D deficiency, unspecified: Secondary | ICD-10-CM | POA: Diagnosis not present

## 2023-02-18 DIAGNOSIS — R809 Proteinuria, unspecified: Secondary | ICD-10-CM | POA: Diagnosis not present

## 2023-02-18 DIAGNOSIS — N200 Calculus of kidney: Secondary | ICD-10-CM | POA: Diagnosis not present

## 2023-02-18 DIAGNOSIS — N1831 Chronic kidney disease, stage 3a: Secondary | ICD-10-CM | POA: Diagnosis not present

## 2023-02-18 DIAGNOSIS — N189 Chronic kidney disease, unspecified: Secondary | ICD-10-CM | POA: Diagnosis not present

## 2023-02-19 DIAGNOSIS — Z0100 Encounter for examination of eyes and vision without abnormal findings: Secondary | ICD-10-CM | POA: Diagnosis not present

## 2023-02-19 DIAGNOSIS — H524 Presbyopia: Secondary | ICD-10-CM | POA: Diagnosis not present

## 2023-02-19 DIAGNOSIS — H35033 Hypertensive retinopathy, bilateral: Secondary | ICD-10-CM | POA: Diagnosis not present

## 2023-02-25 DIAGNOSIS — I129 Hypertensive chronic kidney disease with stage 1 through stage 4 chronic kidney disease, or unspecified chronic kidney disease: Secondary | ICD-10-CM | POA: Diagnosis not present

## 2023-02-25 DIAGNOSIS — N1831 Chronic kidney disease, stage 3a: Secondary | ICD-10-CM | POA: Diagnosis not present

## 2023-02-25 DIAGNOSIS — N200 Calculus of kidney: Secondary | ICD-10-CM | POA: Diagnosis not present

## 2023-03-11 ENCOUNTER — Other Ambulatory Visit: Payer: Self-pay | Admitting: Gastroenterology

## 2023-03-11 ENCOUNTER — Encounter: Payer: Self-pay | Admitting: *Deleted

## 2023-03-11 DIAGNOSIS — K582 Mixed irritable bowel syndrome: Secondary | ICD-10-CM

## 2023-03-11 MED ORDER — HYOSCYAMINE SULFATE 0.125 MG SL SUBL: 0.1250 mg | SUBLINGUAL_TABLET | Freq: Four times a day (QID) | SUBLINGUAL | 1 refills | Status: AC | PRN

## 2023-03-19 ENCOUNTER — Other Ambulatory Visit: Payer: Medicare HMO

## 2023-03-19 DIAGNOSIS — R972 Elevated prostate specific antigen [PSA]: Secondary | ICD-10-CM | POA: Diagnosis not present

## 2023-03-20 LAB — PSA: Prostate Specific Ag, Serum: 4.9 ng/mL — ABNORMAL HIGH (ref 0.0–4.0)

## 2023-03-21 ENCOUNTER — Other Ambulatory Visit: Payer: Self-pay | Admitting: Urology

## 2023-03-21 DIAGNOSIS — N401 Enlarged prostate with lower urinary tract symptoms: Secondary | ICD-10-CM

## 2023-03-25 DIAGNOSIS — M47812 Spondylosis without myelopathy or radiculopathy, cervical region: Secondary | ICD-10-CM | POA: Diagnosis not present

## 2023-03-25 DIAGNOSIS — M546 Pain in thoracic spine: Secondary | ICD-10-CM | POA: Diagnosis not present

## 2023-03-25 DIAGNOSIS — M9901 Segmental and somatic dysfunction of cervical region: Secondary | ICD-10-CM | POA: Diagnosis not present

## 2023-03-25 DIAGNOSIS — M9903 Segmental and somatic dysfunction of lumbar region: Secondary | ICD-10-CM | POA: Diagnosis not present

## 2023-03-25 DIAGNOSIS — M47816 Spondylosis without myelopathy or radiculopathy, lumbar region: Secondary | ICD-10-CM | POA: Diagnosis not present

## 2023-03-25 DIAGNOSIS — M9902 Segmental and somatic dysfunction of thoracic region: Secondary | ICD-10-CM | POA: Diagnosis not present

## 2023-03-25 NOTE — Progress Notes (Signed)
History of Present Illness:   Here for f/u of elevated PSA.   He had a negative biopsy in Aug 2014. At that time, PSA was 9.8, prostatic volume was 90 mL, PSAD 0.11.  All 12 cores were benign.[name] He is on finasteride. Since started, PSA has appropriately decreased.    6.2.2020: PSA 2.7 (corrected 5.4)    3.2.2021: PSA 4.8--corrected 9.6     3.8.2022: PSA 5.9 (corrected 11.8) IPSS 4   QoL score 2.  Recheck PSA in June 2021 3.9.   12.6.2022: PSA 4.3 (corrected 8.6).  He is still on tamsulosin as well as finasteride. IPSS 3, quality-of-life score 2.    12.5.2023: PSA 6 months ago was 3.6, corrected value 7.2.    12.10.2024: PSA 4.9   Past Medical History:  Diagnosis Date   Arteriosclerotic cardiovascular disease (ASCVD)    CABG in 08/1998  (LIMA-mLAD, SVG-rPDA, SVG-OM3-with Y SVG-lateral OM3.)   Atypical nevus 11/29/2005   slight atypia - left hand   Basal cell carcinoma of skin 03/22/2004   Left scapula, superior - CX3 + 5FU   Basal cell carcinoma of skin 03/22/2004   Left scapula, inferior - CX3 + excision   Basal cell carcinoma of skin 03/22/2004   Center mid back - CX3 + 5FU   Basal cell carcinoma of skin 07/24/2004   Left shin - CX3 + 5FU   Basal cell carcinoma of skin 11/29/2005   Left shoulder - CX3 + 5FU   Basal cell carcinoma of skin 10/22/2006   ulcerated on right outer knee - CX3+5FU   Basal cell carcinoma of skin 12/02/2008   left upper arm, superior - CX3+5FU   Basal cell carcinoma of skin 05/16/2011   right shoulder   Basal cell carcinoma of skin 05/16/2011   ulcerated above left sideburn   Basal cell carcinoma of skin 05/16/2011   lateral left arm   Basal cell carcinoma of skin 06/29/2015   nodular on left ear - tx p bx   Basal cell carcinoma of skin 06/08/2016   superficial on left scapha, ear (MOHs)   Basal cell carcinoma of skin 07/25/2017   nodular on left ear (MOHS)   Basal cell carcinoma of skin 07/25/2017   nodular on left thigh - tx p bx   Basal  cell carcinoma of skin 11/18/2018   nodular on right nare - tx p bx   BCC (basal cell carcinoma of skin) 08/25/2019   left upper arm anterior (CX35FU)   BCC (basal cell carcinoma of skin) 08/25/2019   right chin   BPH (benign prostatic hypertrophy)    h/x of prostatis/ UTI in 2006   Chronic kidney disease, stage II (mild) 2010   Creatinine of 1.5 in 2010   GERD (gastroesophageal reflux disease)    s/p espohageal dilatatin and repair of hiatal hernia   Hyperlipidemia    Hypertension    IBS (irritable bowel syndrome)    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   Neck-posterior   PONV (postoperative nausea and vomiting)    Retinal tear of right eye    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   right buccal cheek (CX35FU)   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   right elbow(CX35FU)   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   Right Forearm Upper(well diff) (tx p bx)   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   Right Index Finger(in situ) (tx p bx)   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  Left Forearm,upper(in situ) (tx p bx)   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/18/2018   Base of Left Middle Finger(in situ) (tx p bx)   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/18/2018   Right Outer Forearm,sup(in situ) (tx p bx)   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/18/2018   Right Forearm,Inf(in situ) (tx p bx)   Sigmoid diverticulosis    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   in situ on right hand - CX3 + 5FU   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   in situ on right ear - CX3 + 5FU   Squamous cell carcinoma of skin 01/21/2006   in situ on upper mid back - tx p bx   Squamous cell carcinoma of skin 01/21/2006   in situ on top left shoulder - tx p bx   Squamous cell carcinoma of skin 10/22/2006   KA under left eye - MOHs   Squamous cell carcinoma of skin 12/02/2008   in situ on left base of 4th finger - CX3+5FU   Squamous cell  carcinoma of skin 08/30/2009   well differentiated on right jawline   Squamous cell carcinoma of skin 08/30/2009   in situ on left upper arm - CX3+imiquimod   Squamous cell carcinoma of skin 02/25/2014   in situ on left temple   Squamous cell carcinoma of skin 02/25/2014   in situ on left arm   Squamous cell carcinoma of skin 06/08/2016   in situ on left lower side neck - tx p bx   Squamous cell carcinoma of skin 06/08/2016   left shin - tx p bx   Squamous cell carcinoma of skin 06/08/2016   in situ on right temple - tx p bx   Squamous cell carcinoma of skin 06/08/2016   in situ on right jawline - tx p bx    Past Surgical History:  Procedure Laterality Date   BREAST BIOPSY Right 08/17/2013   Procedure: RIGHT BREAST BIOPSY;  Surgeon: Dalia Heading, MD;  Location: [facility];  Service: General;  Laterality: Right;   CHOLECYSTECTOMY  1990s   COLONOSCOPY  03/17/2012   Rourk; colonic diverticulosis, repeat screening colonoscopy in 10 years.   CORONARY ARTERY BYPASS GRAFT  04/16/1998   ESOPHAGOGASTRODUODENOSCOPY  08/15/2007   Rourk: Food impaction, critical esophageal ring/stricture requiring dilation   HEMORRHOID SURGERY  04/16/2005   total of three   HIATAL HERNIA REPAIR  04/16/1993   LEFT HEART CATH AND CORS/GRAFTS ANGIOGRAPHY N/A 08/23/2016   Procedure: Left Heart Cath and Cors/Grafts Angiography;  Surgeon: Marykay Lex, MD;  Location: [facility] INVASIVE CV LAB;  Service: Cardiovascular;  Laterality: N/A;   LUMBAR LAMINECTOMY  04/16/1997   RETINAL DETACHMENT SURGERY     TEAR DUCT PROBING WITH STRABISMUS REPAIR      Home Medications:  Allergies as of 03/26/2023       Reactions   Meperidine Nausea And Vomiting, Nausea Only   Meperidine Hcl Nausea Only   Statins Other (See Comments)   Severe muscle pain   Codeine Nausea And Vomiting   Hydralazine    neuropathy    Nalbuphine Nausea And Vomiting   Spironolactone    MUSCLE PAIN         Medication List        Accurate as  of March 25, 2023 12:14 PM. If you have any questions, ask your nurse or doctor.          acetaminophen 325 MG tablet Commonly known  as: TYLENOL Take 650 mg by mouth every 6 (six) hours as needed for moderate pain.   amLODipine 10 MG tablet Commonly known as: NORVASC TAKE 1 TABLET BY MOUTH EVERY DAY   aspirin EC 81 MG tablet Take 81 mg by mouth at bedtime.   cyclobenzaprine 10 MG tablet Commonly known as: FLEXERIL Take 10 mg by mouth every 8 (eight) hours as needed.   ezetimibe 10 MG tablet Commonly known as: ZETIA TAKE 1 TABLET BY MOUTH EVERY DAY   finasteride 5 MG tablet Commonly known as: PROSCAR TAKE 1 TABLET BY MOUTH EVERY DAY IN THE EVENING   hyoscyamine 0.125 MG SL tablet Commonly known as: Levsin/SL Place 1 tablet (0.125 mg total) under the tongue every 6 (six) hours as needed (for abdominal cramping and loose stools.).   isosorbide mononitrate 30 MG 24 hr tablet Commonly known as: IMDUR Take 0.5 tablets (15 mg total) by mouth daily.   omeprazole 20 MG capsule Commonly known as: PRILOSEC Take 20 mg by mouth 2 (two) times daily. Before LUNCH and Before SUPPER   polyethylene glycol powder 17 GM/SCOOP powder Commonly known as: GLYCOLAX/MIRALAX Take 17 g by mouth daily.   pravastatin 40 MG tablet Commonly known as: PRAVACHOL Take 40 mg by mouth at bedtime.   PROBIOTIC DAILY PO Take 1 capsule by mouth daily.   psyllium 58.6 % powder Commonly known as: METAMUCIL Take 1 packet by mouth daily.   Restasis 0.05 % ophthalmic emulsion Generic drug: cycloSPORINE Place 1 drop into both eyes 2 (two) times daily.   tamsulosin 0.4 MG Caps capsule Commonly known as: FLOMAX Take 0.4 mg by mouth at bedtime.   zolpidem 10 MG tablet Commonly known as: AMBIEN Take 5 tablets by mouth at bedtime.        Allergies:  Allergies  Allergen Reactions   Meperidine Nausea And Vomiting and Nausea Only   Meperidine Hcl Nausea Only   Statins Other (See Comments)     Severe muscle pain   Codeine Nausea And Vomiting   Hydralazine     neuropathy    Nalbuphine Nausea And Vomiting   Spironolactone     MUSCLE PAIN     Family History  Problem Relation Age of Onset   Coronary artery disease Father        also grandfather   Colon cancer Neg Hx     Social History:  reports that he has never smoked. He has never used smokeless tobacco. He reports that he does not currently use alcohol after a past usage of about 2.0 standard drinks of alcohol per week. He reports that he does not use drugs.  ROS: A complete review of systems was performed.  All systems are negative except for pertinent findings as noted.  Physical Exam:  Vital signs in last 24 hours: There were no vitals taken for this visit. Constitutional:  Alert and oriented, No acute distress Cardiovascular: Regular rate  Respiratory: Normal respiratory effor Genitourinary: Normal anal sphincter tone.  Prostate 60 g, symmetric, nonnodular, nontender. Lymphatic: No lymphadenopathy Neurologic: Grossly intact, no focal deficits Psychiatric: Normal mood and affect  I have reviewed prior pt notes  I have reviewed urinalysis results  I have independently reviewed prior imaging--prior prostate ultrasound  I have reviewed prior PSA results   Impression/Assessment:  1.  BPH, on dual medical therapy with excellent response symptomatically  2.  Elevated PSA, negative biopsy 9 years ago, stable PSA trend  3.  Asymptomatic small right lower pole stone  Plan:  1.  Reassurance regarding his stone.  Apparently he has a 24-hour urine pending  2.  PSA is checked today  3.  I will see back in 1 year following PSA

## 2023-03-26 ENCOUNTER — Ambulatory Visit: Payer: Medicare HMO | Admitting: Urology

## 2023-03-26 ENCOUNTER — Encounter: Payer: Self-pay | Admitting: Urology

## 2023-03-26 VITALS — BP 145/68 | HR 92

## 2023-03-26 DIAGNOSIS — R350 Frequency of micturition: Secondary | ICD-10-CM

## 2023-03-26 DIAGNOSIS — N401 Enlarged prostate with lower urinary tract symptoms: Secondary | ICD-10-CM | POA: Diagnosis not present

## 2023-03-26 DIAGNOSIS — N200 Calculus of kidney: Secondary | ICD-10-CM

## 2023-03-26 DIAGNOSIS — R972 Elevated prostate specific antigen [PSA]: Secondary | ICD-10-CM

## 2023-03-26 LAB — URINALYSIS, ROUTINE W REFLEX MICROSCOPIC
Bilirubin, UA: NEGATIVE
Glucose, UA: NEGATIVE
Ketones, UA: NEGATIVE
Leukocytes,UA: NEGATIVE
Nitrite, UA: NEGATIVE
Protein,UA: NEGATIVE
RBC, UA: NEGATIVE
Specific Gravity, UA: 1.01 (ref 1.005–1.030)
Urobilinogen, Ur: 1 mg/dL (ref 0.2–1.0)
pH, UA: 7 (ref 5.0–7.5)

## 2023-03-27 DIAGNOSIS — M9903 Segmental and somatic dysfunction of lumbar region: Secondary | ICD-10-CM | POA: Diagnosis not present

## 2023-03-27 DIAGNOSIS — M9902 Segmental and somatic dysfunction of thoracic region: Secondary | ICD-10-CM | POA: Diagnosis not present

## 2023-03-27 DIAGNOSIS — M47812 Spondylosis without myelopathy or radiculopathy, cervical region: Secondary | ICD-10-CM | POA: Diagnosis not present

## 2023-03-27 DIAGNOSIS — M47816 Spondylosis without myelopathy or radiculopathy, lumbar region: Secondary | ICD-10-CM | POA: Diagnosis not present

## 2023-03-27 DIAGNOSIS — M546 Pain in thoracic spine: Secondary | ICD-10-CM | POA: Diagnosis not present

## 2023-03-27 DIAGNOSIS — M9901 Segmental and somatic dysfunction of cervical region: Secondary | ICD-10-CM | POA: Diagnosis not present

## 2023-04-02 DIAGNOSIS — L568 Other specified acute skin changes due to ultraviolet radiation: Secondary | ICD-10-CM | POA: Diagnosis not present

## 2023-04-02 DIAGNOSIS — L570 Actinic keratosis: Secondary | ICD-10-CM | POA: Diagnosis not present

## 2023-04-04 DIAGNOSIS — H04123 Dry eye syndrome of bilateral lacrimal glands: Secondary | ICD-10-CM | POA: Diagnosis not present

## 2023-04-04 DIAGNOSIS — H04563 Stenosis of bilateral lacrimal punctum: Secondary | ICD-10-CM | POA: Diagnosis not present

## 2023-04-04 DIAGNOSIS — H0011 Chalazion right upper eyelid: Secondary | ICD-10-CM | POA: Diagnosis not present

## 2023-04-04 DIAGNOSIS — H04543 Stenosis of bilateral lacrimal canaliculi: Secondary | ICD-10-CM | POA: Diagnosis not present

## 2023-04-04 DIAGNOSIS — H02115 Cicatricial ectropion of left lower eyelid: Secondary | ICD-10-CM | POA: Diagnosis not present

## 2023-04-04 DIAGNOSIS — H04223 Epiphora due to insufficient drainage, bilateral lacrimal glands: Secondary | ICD-10-CM | POA: Diagnosis not present

## 2023-04-04 DIAGNOSIS — H0279 Other degenerative disorders of eyelid and periocular area: Secondary | ICD-10-CM | POA: Diagnosis not present

## 2023-04-15 ENCOUNTER — Encounter: Payer: Self-pay | Admitting: Cardiology

## 2023-05-07 DIAGNOSIS — E7849 Other hyperlipidemia: Secondary | ICD-10-CM | POA: Diagnosis not present

## 2023-05-07 DIAGNOSIS — E782 Mixed hyperlipidemia: Secondary | ICD-10-CM | POA: Diagnosis not present

## 2023-05-07 DIAGNOSIS — R7309 Other abnormal glucose: Secondary | ICD-10-CM | POA: Diagnosis not present

## 2023-05-07 DIAGNOSIS — N183 Chronic kidney disease, stage 3 unspecified: Secondary | ICD-10-CM | POA: Diagnosis not present

## 2023-05-07 DIAGNOSIS — E119 Type 2 diabetes mellitus without complications: Secondary | ICD-10-CM | POA: Diagnosis not present

## 2023-05-07 DIAGNOSIS — Z0000 Encounter for general adult medical examination without abnormal findings: Secondary | ICD-10-CM | POA: Diagnosis not present

## 2023-05-07 DIAGNOSIS — Z1331 Encounter for screening for depression: Secondary | ICD-10-CM | POA: Diagnosis not present

## 2023-05-07 DIAGNOSIS — Z6824 Body mass index (BMI) 24.0-24.9, adult: Secondary | ICD-10-CM | POA: Diagnosis not present

## 2023-05-07 DIAGNOSIS — I2510 Atherosclerotic heart disease of native coronary artery without angina pectoris: Secondary | ICD-10-CM | POA: Diagnosis not present

## 2023-05-07 DIAGNOSIS — I10 Essential (primary) hypertension: Secondary | ICD-10-CM | POA: Diagnosis not present

## 2023-05-16 NOTE — Progress Notes (Signed)
Referring Provider: Elfredia Nevins, MD Primary Care Physician:  Elfredia Nevins, MD Primary GI Physician: Dr. Jena Gauss  Chief Complaint  Patient presents with   Follow-up    Follow up. No problems     HPI:   [name] is a 78 y.o. male with history of IBS with diarrhea, GERD, presenting today for follow-up of alternating bowel habits with abdominal discomfort.  Last seen in the office 02/14/2023.  He reported 4 to 5 weeks of alternating bowel habits.  He was skipping 3 to 4 days between a bowel movement, then having an episode of bloating, cramping, passing hard stool, followed by diarrhea.  Suspected patient symptoms were secondary to IBS-Mixed.  Recommended starting MiraLAX daily.  If he continued with intermittent diarrhea, could consider course of Xifaxan refilling prescription of Levsin to use as needed as this previously worked well for him.  On 11/25, patient reported recurrent abdominal cramping.  A prescription of Levsin was sent to his pharmacy.  Today:  Feeling much better. States bowel are morning normally. He is taking MiraLAX daily and is having soft, normal bowel movements most every day.  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  He does not have to take this every day.  He has not had another severe episode since he received the prescription for Levsin in November.  No BRBPR, melena, unintentional weight loss.  Colonoscopy 03/17/2012: Colonic diverticulosis.  Recommended 10-year screening. Patient states he will hold off on this unless he develops new symptoms.  Past Medical History:  Diagnosis Date   Arteriosclerotic cardiovascular disease (ASCVD)    CABG in 08/1998  (LIMA-mLAD, SVG-rPDA, SVG-OM3-with Y SVG-lateral OM3.)   Atypical nevus 11/29/2005   slight atypia - left hand   Basal cell carcinoma of skin 03/22/2004   Left scapula, superior - CX3 + 5FU   Basal cell carcinoma of skin 03/22/2004   Left scapula, inferior - CX3 + excision    Basal cell carcinoma of skin 03/22/2004   Center mid back - CX3 + 5FU   Basal cell carcinoma of skin 07/24/2004   Left shin - CX3 + 5FU   Basal cell carcinoma of skin 11/29/2005   Left shoulder - CX3 + 5FU   Basal cell carcinoma of skin 10/22/2006   ulcerated on right outer knee - CX3+5FU   Basal cell carcinoma of skin 12/02/2008   left upper arm, superior - CX3+5FU   Basal cell carcinoma of skin 05/16/2011   right shoulder   Basal cell carcinoma of skin 05/16/2011   ulcerated above left sideburn   Basal cell carcinoma of skin 05/16/2011   lateral left arm   Basal cell carcinoma of skin 06/29/2015   nodular on left ear - tx p bx   Basal cell carcinoma of skin 06/08/2016   superficial on left scapha, ear (MOHs)   Basal cell carcinoma of skin 07/25/2017   nodular on left ear (MOHS)   Basal cell carcinoma of skin 07/25/2017   nodular on left thigh - tx p bx   Basal cell carcinoma of skin 11/18/2018   nodular on right nare - tx p bx   BCC (basal cell carcinoma of skin) 08/25/2019   left upper arm anterior (CX35FU)   BCC (basal cell carcinoma of skin) 08/25/2019   right chin   BPH (benign prostatic hypertrophy)    h/x of prostatis/ UTI in 2006   Chronic kidney disease, stage II (mild) 2010   Creatinine of 1.5 in 2010  GERD (gastroesophageal reflux disease)    s/p espohageal dilatatin and repair of hiatal hernia   Hyperlipidemia    Hypertension    IBS (irritable bowel syndrome)    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   Neck-posterior   PONV (postoperative nausea and vomiting)    Retinal tear of right eye    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   right buccal cheek (CX35FU)   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   right elbow(CX35FU)   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   Right Forearm Upper(well diff) (tx p bx)   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   Right Index Finger(in situ) (tx p bx)   SCCA (squamous cell carcinoma) of  skin 01/01/2018   Left Forearm,upper(in situ) (tx p bx)   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/18/2018   Base of Left Middle Finger(in situ) (tx p bx)   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/18/2018   Right Outer Forearm,sup(in situ) (tx p bx)   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/18/2018   Right Forearm,Inf(in situ) (tx p bx)   Sigmoid diverticulosis    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   in situ on right hand - CX3 + 5FU   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   in situ on right ear - CX3 + 5FU   Squamous cell carcinoma of skin 01/21/2006   in situ on upper mid back - tx p bx   Squamous cell carcinoma of skin 01/21/2006   in situ on top left shoulder - tx p bx   Squamous cell carcinoma of skin 10/22/2006   KA under left eye - MOHs   Squamous cell carcinoma of skin 12/02/2008   in situ on left base of 4th finger - CX3+5FU   Squamous cell carcinoma of skin 08/30/2009   well differentiated on right jawline   Squamous cell carcinoma of skin 08/30/2009   in situ on left upper arm - CX3+imiquimod   Squamous cell carcinoma of skin 02/25/2014   in situ on left temple   Squamous cell carcinoma of skin 02/25/2014   in situ on left arm   Squamous cell carcinoma of skin 06/08/2016   in situ on left lower side neck - tx p bx   Squamous cell carcinoma of skin 06/08/2016   left shin - tx p bx   Squamous cell carcinoma of skin 06/08/2016   in situ on right temple - tx p bx   Squamous cell carcinoma of skin 06/08/2016   in situ on right jawline - tx p bx    Past Surgical History:  Procedure Laterality Date   BREAST BIOPSY Right 08/17/2013   Procedure: RIGHT BREAST BIOPSY;  Surgeon: Dalia Heading, MD;  Location: [facility];  Service: General;  Laterality: Right;   CHOLECYSTECTOMY  1990s   COLONOSCOPY  03/17/2012   Rourk; colonic diverticulosis, repeat screening colonoscopy in 10 years.   CORONARY ARTERY BYPASS GRAFT  04/16/1998   ESOPHAGOGASTRODUODENOSCOPY   08/15/2007   Rourk: Food impaction, critical esophageal ring/stricture requiring dilation   HEMORRHOID SURGERY  04/16/2005   total of three   HIATAL HERNIA REPAIR  04/16/1993   LEFT HEART CATH AND CORS/GRAFTS ANGIOGRAPHY N/A 08/23/2016   Procedure: Left Heart Cath and Cors/Grafts Angiography;  Surgeon: Marykay Lex, MD;  Location: [facility] INVASIVE CV LAB;  Service: Cardiovascular;  Laterality: N/A;   LUMBAR LAMINECTOMY  04/16/1997   RETINAL DETACHMENT SURGERY  TEAR DUCT PROBING WITH STRABISMUS REPAIR      Current Outpatient Medications  Medication Sig Dispense Refill   acetaminophen (TYLENOL) 325 MG tablet Take 650 mg by mouth every 6 (six) hours as needed for moderate pain.     amLODipine (NORVASC) 10 MG tablet TAKE 1 TABLET BY MOUTH EVERY DAY 90 tablet 2   aspirin 81 MG EC tablet Take 81 mg by mouth at bedtime.     ezetimibe (ZETIA) 10 MG tablet TAKE 1 TABLET BY MOUTH EVERY DAY 90 tablet 2   finasteride (PROSCAR) 5 MG tablet TAKE 1 TABLET BY MOUTH EVERY DAY IN THE EVENING 90 tablet 3   hyoscyamine (LEVSIN/SL) 0.125 MG SL tablet Place 1 tablet (0.125 mg total) under the tongue every 6 (six) hours as needed (for abdominal cramping and loose stools.). 30 tablet 1   isosorbide mononitrate (IMDUR) 30 MG 24 hr tablet Take 0.5 tablets (15 mg total) by mouth daily. 45 tablet 3   omeprazole (PRILOSEC) 20 MG capsule Take 20 mg by mouth 2 (two) times daily. Before LUNCH and Before SUPPER     polyethylene glycol powder (GLYCOLAX/MIRALAX) 17 GM/SCOOP powder Take 17 g by mouth daily. 255 g 0   pravastatin (PRAVACHOL) 40 MG tablet Take 40 mg by mouth at bedtime.     Probiotic Product (PROBIOTIC DAILY PO) Take 1 capsule by mouth daily.      psyllium (METAMUCIL) 58.6 % powder Take 1 packet by mouth daily.     RESTASIS 0.05 % ophthalmic emulsion Place 1 drop into both eyes 2 (two) times daily.     tamsulosin (FLOMAX) 0.4 MG CAPS capsule Take 0.4 mg by mouth at bedtime.     zolpidem (AMBIEN) 10 MG  tablet Take 5 tablets by mouth at bedtime.     cyclobenzaprine (FLEXERIL) 10 MG tablet Take 10 mg by mouth every 8 (eight) hours as needed. (Patient not taking: Reported on 05/17/2023)     No current facility-administered medications for this visit.    Allergies as of 05/17/2023 - Review Complete 05/17/2023  Allergen Reaction Noted   Meperidine Nausea And Vomiting and Nausea Only 03/04/2012   Meperidine hcl Nausea Only 06/21/2020   Statins Other (See Comments) 07/23/2016   Codeine Nausea And Vomiting 04/05/2009   Hydralazine  05/06/2019   Nalbuphine Nausea And Vomiting 04/05/2009   Spironolactone  06/22/2019    Family History  Problem Relation Age of Onset   Coronary artery disease Father        also grandfather   Colon cancer Neg Hx     Social History   Socioeconomic History   Marital status: Married    Spouse name: Not on file   Number of children: Not on file   Years of education: Not on file   Highest education level: Not on file  Occupational History   Occupation: Public affairs consultant    Employer: [employer]  Tobacco Use   Smoking status: Never   Smokeless tobacco: Never   Tobacco comments:    no use of tobacco products  Vaping Use   Vaping status: Never Used  Substance and Sexual Activity   Alcohol use: Not Currently    Alcohol/week: 2.0 standard drinks of alcohol    Types: 2 Standard drinks or equivalent per week    Comment: "Occasionally"   Drug use: Never   Sexual activity: Yes    Partners: Female  Other Topics Concern   Not on file  Social History Narrative   Previously  Employed as an Public affairs consultant.    Social Drivers of Corporate investment banker Strain: Not on file  Food Insecurity: Not on file  Transportation Needs: Not on file  Physical Activity: Not on file  Stress: Not on file  Social Connections: Not on file    Review of Systems: Gen: Denies fever, chills, cold or flulike symptoms, presyncope, syncope. GI: See HPI Heme:  See HPI  Physical Exam: BP 136/72 (BP Location: Right Arm, Patient Position: Sitting, Cuff Size: Normal)   Pulse 75   Temp 97.9 F (36.6 C) (Temporal)   Ht 5\' 9"  (1.753 m)   Wt 162 lb 3.2 oz (73.6 kg)   BMI 23.95 kg/m  General:   Alert and oriented. No distress noted. Pleasant and cooperative.  Head:  Normocephalic and atraumatic. Eyes:  Conjuctiva clear without scleral icterus. Abdomen:  +BS, soft, non-tender and non-distended. No rebound or guarding. No HSM or masses noted. Msk:  Symmetrical without gross deformities. Normal posture. Neurologic:  Alert and  oriented x4 Psych:  Normal mood and affect.    Assessment:  78 year old male with history of IBS, GERD, presenting today for follow-up of IBS mixed type.  Symptoms are now very well-controlled on MiraLAX daily and Levsin as needed.  Patient is very satisfied with his current regimen and how he is feeling.  He has no alarm symptoms.  We did discuss that he is overdue for screening colonoscopy as his last colonoscopy was in December 2013; patient states he will continue to hold off on this unless he develops new symptoms.   Plan:  Continue MiraLAX 17 g daily. Continue Levsin 0.125 mg as needed. Follow-up in 6 months or sooner if needed.   Ermalinda Memos, PA-C [facility] 05/17/2023

## 2023-05-17 ENCOUNTER — Encounter: Payer: Self-pay | Admitting: Gastroenterology

## 2023-05-17 ENCOUNTER — Ambulatory Visit (INDEPENDENT_AMBULATORY_CARE_PROVIDER_SITE_OTHER): Payer: PPO | Admitting: Gastroenterology

## 2023-05-17 VITALS — BP 136/72 | HR 75 | Temp 97.9°F | Ht 69.0 in | Wt 162.2 lb

## 2023-05-17 DIAGNOSIS — K582 Mixed irritable bowel syndrome: Secondary | ICD-10-CM | POA: Diagnosis not present

## 2023-05-17 NOTE — Patient Instructions (Addendum)
Continue taking MiraLAX daily.  Continue using Levsin as needed.  I am glad you are feeling much better!  I will plan to see you back for routine follow-up in 6 months or sooner if needed.  Ermalinda Memos, PA-C [facility]

## 2023-06-03 DIAGNOSIS — D045 Carcinoma in situ of skin of trunk: Secondary | ICD-10-CM | POA: Diagnosis not present

## 2023-06-03 DIAGNOSIS — L570 Actinic keratosis: Secondary | ICD-10-CM | POA: Diagnosis not present

## 2023-06-03 DIAGNOSIS — L568 Other specified acute skin changes due to ultraviolet radiation: Secondary | ICD-10-CM | POA: Diagnosis not present

## 2023-06-03 DIAGNOSIS — D485 Neoplasm of uncertain behavior of skin: Secondary | ICD-10-CM | POA: Diagnosis not present

## 2023-06-14 ENCOUNTER — Other Ambulatory Visit: Payer: Self-pay | Admitting: Cardiology

## 2023-06-18 DIAGNOSIS — L988 Other specified disorders of the skin and subcutaneous tissue: Secondary | ICD-10-CM | POA: Diagnosis not present

## 2023-06-18 DIAGNOSIS — D045 Carcinoma in situ of skin of trunk: Secondary | ICD-10-CM | POA: Diagnosis not present

## 2023-06-21 DIAGNOSIS — M9903 Segmental and somatic dysfunction of lumbar region: Secondary | ICD-10-CM | POA: Diagnosis not present

## 2023-06-21 DIAGNOSIS — S335XXA Sprain of ligaments of lumbar spine, initial encounter: Secondary | ICD-10-CM | POA: Diagnosis not present

## 2023-06-21 DIAGNOSIS — M546 Pain in thoracic spine: Secondary | ICD-10-CM | POA: Diagnosis not present

## 2023-06-21 DIAGNOSIS — S134XXA Sprain of ligaments of cervical spine, initial encounter: Secondary | ICD-10-CM | POA: Diagnosis not present

## 2023-06-21 DIAGNOSIS — M47816 Spondylosis without myelopathy or radiculopathy, lumbar region: Secondary | ICD-10-CM | POA: Diagnosis not present

## 2023-06-21 DIAGNOSIS — M9902 Segmental and somatic dysfunction of thoracic region: Secondary | ICD-10-CM | POA: Diagnosis not present

## 2023-06-21 DIAGNOSIS — M9901 Segmental and somatic dysfunction of cervical region: Secondary | ICD-10-CM | POA: Diagnosis not present

## 2023-06-27 DIAGNOSIS — M9902 Segmental and somatic dysfunction of thoracic region: Secondary | ICD-10-CM | POA: Diagnosis not present

## 2023-06-27 DIAGNOSIS — M47816 Spondylosis without myelopathy or radiculopathy, lumbar region: Secondary | ICD-10-CM | POA: Diagnosis not present

## 2023-06-27 DIAGNOSIS — M9903 Segmental and somatic dysfunction of lumbar region: Secondary | ICD-10-CM | POA: Diagnosis not present

## 2023-06-27 DIAGNOSIS — M546 Pain in thoracic spine: Secondary | ICD-10-CM | POA: Diagnosis not present

## 2023-06-27 DIAGNOSIS — S134XXA Sprain of ligaments of cervical spine, initial encounter: Secondary | ICD-10-CM | POA: Diagnosis not present

## 2023-06-27 DIAGNOSIS — S335XXA Sprain of ligaments of lumbar spine, initial encounter: Secondary | ICD-10-CM | POA: Diagnosis not present

## 2023-06-27 DIAGNOSIS — M9901 Segmental and somatic dysfunction of cervical region: Secondary | ICD-10-CM | POA: Diagnosis not present

## 2023-07-05 ENCOUNTER — Encounter: Payer: Self-pay | Admitting: *Deleted

## 2023-07-07 ENCOUNTER — Other Ambulatory Visit: Payer: Self-pay | Admitting: Cardiology

## 2023-07-08 ENCOUNTER — Encounter: Payer: Self-pay | Admitting: Cardiology

## 2023-07-08 ENCOUNTER — Ambulatory Visit: Payer: Medicare HMO | Attending: Cardiology | Admitting: Cardiology

## 2023-07-08 VITALS — BP 132/64 | HR 72 | Ht 69.0 in | Wt 154.6 lb

## 2023-07-08 DIAGNOSIS — I2510 Atherosclerotic heart disease of native coronary artery without angina pectoris: Secondary | ICD-10-CM | POA: Diagnosis not present

## 2023-07-08 DIAGNOSIS — I10 Essential (primary) hypertension: Secondary | ICD-10-CM | POA: Diagnosis not present

## 2023-07-08 DIAGNOSIS — E782 Mixed hyperlipidemia: Secondary | ICD-10-CM | POA: Diagnosis not present

## 2023-07-08 NOTE — Patient Instructions (Signed)
 Medication Instructions:  Continue all current medications.   Labwork: none  Testing/Procedures: none  Follow-Up: 6 months   Any Other Special Instructions Will Be Listed Below (If Applicable).   If you need a refill on your cardiac medications before your next appointment, please call your pharmacy.

## 2023-07-08 NOTE — Progress Notes (Signed)
 Clinical Summary [name] is a 78 y.o.male seen today for follow up of the following medical problems.     1. CAD - CABG in 2000 - 06/2015 completed echo which showed normal LVEF. Exercise MPI he did 8 min 25 sec with no EKG changes and no ischemia by imaging. - has not been on ACE-I since prior admission with AKI, had also been on NSAIDs at that time.    - admit 08/2016 with NSTEMI, cath as reported below, no interventional targets, managed medically. Imdur added to medical regimen and lopressor 12.5mg  bid added.  - lopressor later stopped due to bradycardia    - no recent chest pains, no SOB/DOE - compliant with meds     2. HTN - metoprolol stopped due to low HR's in 09/2017 - prior AKI on ACE-I, though heavy NSAID use at the time also -listed hydralazine, aldactone allergies.      - we previously increased norvasc to 10mg  daily. Reports some prior swelling in the past on norvasc but has tolerated reasonablly well since then - home bp's 130s/60s     3. Hyperlipidemia - leg pains on vytorin. He is on pravastatin 03/2022 TC 177 TG 99 HDL 57 LDL 102 - he is concerned about higher doses of statin    10/2022 TC 148 TG 95 HDL 55 LDL 75   4. CKD - followed by nephrology by Dr [name]  Last Cr up to 1.22 02/2022 1.22   5. LE edema -imprioved with prn lasix, had recent varicose vein procedure which helped alot - no recent edema   6. Varicose veins - prior intervention   Past Medical History:  Diagnosis Date   Arteriosclerotic cardiovascular disease (ASCVD)    CABG in 08/1998  (LIMA-mLAD, SVG-rPDA, SVG-OM3-with Y SVG-lateral OM3.)   Atypical nevus 11/29/2005   slight atypia - left hand   Basal cell carcinoma of skin 03/22/2004   Left scapula, superior - CX3 + 5FU   Basal cell carcinoma of skin 03/22/2004   Left scapula, inferior - CX3 + excision   Basal cell carcinoma of skin 03/22/2004   Center mid back - CX3 + 5FU   Basal cell carcinoma of skin 07/24/2004    Left shin - CX3 + 5FU   Basal cell carcinoma of skin 11/29/2005   Left shoulder - CX3 + 5FU   Basal cell carcinoma of skin 10/22/2006   ulcerated on right outer knee - CX3+5FU   Basal cell carcinoma of skin 12/02/2008   left upper arm, superior - CX3+5FU   Basal cell carcinoma of skin 05/16/2011   right shoulder   Basal cell carcinoma of skin 05/16/2011   ulcerated above left sideburn   Basal cell carcinoma of skin 05/16/2011   lateral left arm   Basal cell carcinoma of skin 06/29/2015   nodular on left ear - tx p bx   Basal cell carcinoma of skin 06/08/2016   superficial on left scapha, ear (MOHs)   Basal cell carcinoma of skin 07/25/2017   nodular on left ear (MOHS)   Basal cell carcinoma of skin 07/25/2017   nodular on left thigh - tx p bx   Basal cell carcinoma of skin 11/18/2018   nodular on right nare - tx p bx   BCC (basal cell carcinoma of skin) 08/25/2019   left upper arm anterior (CX35FU)   BCC (basal cell carcinoma of skin) 08/25/2019   right chin   BPH (benign prostatic hypertrophy)  h/x of prostatis/ UTI in 2006   Chronic kidney disease, stage II (mild) 2010   Creatinine of 1.5 in 2010   GERD (gastroesophageal reflux disease)    s/p espohageal dilatatin and repair of hiatal hernia   Hyperlipidemia    Hypertension    IBS (irritable bowel syndrome)    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   Neck-posterior   PONV (postoperative nausea and vomiting)    Retinal tear of right eye    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   right buccal cheek (CX35FU)   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   right elbow(CX35FU)   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   Right Forearm Upper(well diff) (tx p bx)   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   Right Index Finger(in situ) (tx p bx)   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   Left Forearm,upper(in situ) (tx p bx)   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/18/2018   Base  of Left Middle Finger(in situ) (tx p bx)   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/18/2018   Right Outer Forearm,sup(in situ) (tx p bx)   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/18/2018   Right Forearm,Inf(in situ) (tx p bx)   Sigmoid diverticulosis    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   in situ on right hand - CX3 + 5FU   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   in situ on right ear - CX3 + 5FU   Squamous cell carcinoma of skin 01/21/2006   in situ on upper mid back - tx p bx   Squamous cell carcinoma of skin 01/21/2006   in situ on top left shoulder - tx p bx   Squamous cell carcinoma of skin 10/22/2006   KA under left eye - MOHs   Squamous cell carcinoma of skin 12/02/2008   in situ on left base of 4th finger - CX3+5FU   Squamous cell carcinoma of skin 08/30/2009   well differentiated on right jawline   Squamous cell carcinoma of skin 08/30/2009   in situ on left upper arm - CX3+imiquimod   Squamous cell carcinoma of skin 02/25/2014   in situ on left temple   Squamous cell carcinoma of skin 02/25/2014   in situ on left arm   Squamous cell carcinoma of skin 06/08/2016   in situ on left lower side neck - tx p bx   Squamous cell carcinoma of skin 06/08/2016   left shin - tx p bx   Squamous cell carcinoma of skin 06/08/2016   in situ on right temple - tx p bx   Squamous cell carcinoma of skin 06/08/2016   in situ on right jawline - tx p bx     Allergies  Allergen Reactions   Meperidine Nausea And Vomiting and Nausea Only   Meperidine Hcl Nausea Only   Statins Other (See Comments)    Severe muscle pain   Codeine Nausea And Vomiting   Hydralazine     neuropathy    Nalbuphine Nausea And Vomiting   Spironolactone     MUSCLE PAIN      Current Outpatient Medications  Medication Sig Dispense Refill   acetaminophen (TYLENOL) 325 MG tablet Take 650 mg by mouth every 6 (six) hours as needed for moderate pain.     amLODipine (NORVASC) 10 MG tablet TAKE 1  TABLET BY MOUTH EVERY DAY 90 tablet 2   aspirin 81 MG EC tablet Take 81 mg  by mouth at bedtime.     cyclobenzaprine (FLEXERIL) 10 MG tablet Take 10 mg by mouth every 8 (eight) hours as needed. (Patient not taking: Reported on 05/17/2023)     ezetimibe (ZETIA) 10 MG tablet TAKE 1 TABLET BY MOUTH EVERY DAY 90 tablet 2   finasteride (PROSCAR) 5 MG tablet TAKE 1 TABLET BY MOUTH EVERY DAY IN THE EVENING 90 tablet 3   hyoscyamine (LEVSIN/SL) 0.125 MG SL tablet Place 1 tablet (0.125 mg total) under the tongue every 6 (six) hours as needed (for abdominal cramping and loose stools.). 30 tablet 1   isosorbide mononitrate (IMDUR) 30 MG 24 hr tablet TAKE 1/2 OF A TABLET (15 MG TOTAL) BY MOUTH DAILY 45 tablet 1   omeprazole (PRILOSEC) 20 MG capsule Take 20 mg by mouth 2 (two) times daily. Before LUNCH and Before SUPPER     polyethylene glycol powder (GLYCOLAX/MIRALAX) 17 GM/SCOOP powder Take 17 g by mouth daily. 255 g 0   pravastatin (PRAVACHOL) 40 MG tablet Take 40 mg by mouth at bedtime.     Probiotic Product (PROBIOTIC DAILY PO) Take 1 capsule by mouth daily.      psyllium (METAMUCIL) 58.6 % powder Take 1 packet by mouth daily.     RESTASIS 0.05 % ophthalmic emulsion Place 1 drop into both eyes 2 (two) times daily.     tamsulosin (FLOMAX) 0.4 MG CAPS capsule Take 0.4 mg by mouth at bedtime.     zolpidem (AMBIEN) 10 MG tablet Take 5 tablets by mouth at bedtime.     No current facility-administered medications for this visit.     Past Surgical History:  Procedure Laterality Date   BREAST BIOPSY Right 08/17/2013   Procedure: RIGHT BREAST BIOPSY;  Surgeon: Dalia Heading, MD;  Location: [facility];  Service: General;  Laterality: Right;   CHOLECYSTECTOMY  1990s   COLONOSCOPY  03/17/2012   Rourk; colonic diverticulosis, repeat screening colonoscopy in 10 years.   CORONARY ARTERY BYPASS GRAFT  04/16/1998   ESOPHAGOGASTRODUODENOSCOPY  08/15/2007   Rourk: Food impaction, critical esophageal ring/stricture  requiring dilation   HEMORRHOID SURGERY  04/16/2005   total of three   HIATAL HERNIA REPAIR  04/16/1993   LEFT HEART CATH AND CORS/GRAFTS ANGIOGRAPHY N/A 08/23/2016   Procedure: Left Heart Cath and Cors/Grafts Angiography;  Surgeon: Marykay Lex, MD;  Location: [facility] INVASIVE CV LAB;  Service: Cardiovascular;  Laterality: N/A;   LUMBAR LAMINECTOMY  04/16/1997   RETINAL DETACHMENT SURGERY     TEAR DUCT PROBING WITH STRABISMUS REPAIR       Allergies  Allergen Reactions   Meperidine Nausea And Vomiting and Nausea Only   Meperidine Hcl Nausea Only   Statins Other (See Comments)    Severe muscle pain   Codeine Nausea And Vomiting   Hydralazine     neuropathy    Nalbuphine Nausea And Vomiting   Spironolactone     MUSCLE PAIN       Family History  Problem Relation Age of Onset   Coronary artery disease Father        also grandfather   Colon cancer Neg Hx      Social History Mr. Zabriskie reports that he has never smoked. He has never used smokeless tobacco. Mr. Seelman reports that he does not currently use alcohol after a past usage of about 2.0 standard drinks of alcohol per week.   Physical Examination Today's Vitals   07/08/23 1456  BP: 132/64  Pulse: 72  SpO2: 96%  Weight: 154 lb 9.6 oz (70.1 kg)  Height: 5\' 9"  (1.753 m)   Body mass index is 22.83 kg/m.  Gen: resting comfortably, no acute distress HEENT: no scleral icterus, pupils equal round and reactive, no palptable cervical adenopathy,  CV: RRR, no m/rg, no jvd Resp: Clear to auscultation bilaterally GI: abdomen is soft, non-tender, non-distended, normal bowel sounds, no hepatosplenomegaly MSK: extremities are warm, no edema.  Skin: warm, no rash Neuro:  no focal deficits Psych: appropriate affect   Diagnostic Studies 06/2015 echo Study Conclusions   - Left ventricle: The cavity size was normal. Wall thickness was   increased increased in a pattern of mild to moderate LVH.   Systolic function was  normal. The estimated ejection fraction was   in the range of 60% to 65%. Wall motion was normal; there were no   regional wall motion abnormalities. Left ventricular diastolic   function parameters were normal. - Aortic valve: Mildly calcified annulus. Trileaflet; mildly   thickened leaflets. Valve area (VTI): 2.58 cm^2. Valve area   (Vmax): 2.89 cm^2. - Mitral valve: There was mild regurgitation. - Systemic veins: Small IVC, suggesting low RA pressures and   hypovolemia. - Technically adequate study.     06/2015 Exercise Stress MPI Blood pressure demonstrated a hypertensive response to exercise. The study is normal. This is a low risk study. The left ventricular ejection fraction is normal (55-65%).     08/2016 cath Mid RCA lesion, 80 %stenosed. Dist RCA lesion, 100 %stenosed. SVG-mRPDA graft was visualized by angiography and is large and anatomically normal. Potential Culpril (not PCI Target). Ost RPDA lesion, 90 %stenosed, Ost RPDA to RPDA lesion, 70 %stenosed.- filled retrograde from SVG Ost 3rd Mrg to 3rd Mrg lesion, 80 %stenosed. SVG-3rd OM graft was visualized by angiography and is large and anatomically normal. Y Graft SVG-Lat 3rd OM is moderate in size and anatomically normal. Origin lesion, 40 %stenosed. Ost LAD to Prox LAD lesion, 80 %stenosed. Prox LAD to Mid LAD lesion, 99 %stenosed. LIMA graft was visualized by angiography and is normal in caliber and anatomically normal. The left ventricular systolic function is normal. The left ventricular ejection fraction is 55-65% by visual estimate. LV end diastolic pressure is normal.   No obvious culprit lesions found. Clearly has small vessel disease involving the nongrafted diagonal [name] but this is not a very approachable PCI target. The other potential culprit is the severe disease in the retrograde flow from SVG-RPDA back to the PL system. Is also not PCI target.   Recommend: Optimize medical management    Assessment  and Plan  1. CAD - no recent symptoms, continue current meds   2. HTN - Bradycardia on beta blockers, AKI on ACEI, also avoiding diuretic due to renal dysfunction. Allergies to hydralazine and aldactone.  - bp at goal, continue current meds    3. Hyperlipidemia -he is hesitant to increase pravastatin due to concern for side effects.  -LDL reasonably controlled on pravastatin 79m and zetia 10mg  daily, continue current therapy      [name], M.D.

## 2023-08-21 DIAGNOSIS — D631 Anemia in chronic kidney disease: Secondary | ICD-10-CM | POA: Diagnosis not present

## 2023-08-21 DIAGNOSIS — N189 Chronic kidney disease, unspecified: Secondary | ICD-10-CM | POA: Diagnosis not present

## 2023-08-21 DIAGNOSIS — R809 Proteinuria, unspecified: Secondary | ICD-10-CM | POA: Diagnosis not present

## 2023-08-28 DIAGNOSIS — L568 Other specified acute skin changes due to ultraviolet radiation: Secondary | ICD-10-CM | POA: Diagnosis not present

## 2023-08-28 DIAGNOSIS — I129 Hypertensive chronic kidney disease with stage 1 through stage 4 chronic kidney disease, or unspecified chronic kidney disease: Secondary | ICD-10-CM | POA: Diagnosis not present

## 2023-08-28 DIAGNOSIS — D485 Neoplasm of uncertain behavior of skin: Secondary | ICD-10-CM | POA: Diagnosis not present

## 2023-08-28 DIAGNOSIS — I2510 Atherosclerotic heart disease of native coronary artery without angina pectoris: Secondary | ICD-10-CM | POA: Diagnosis not present

## 2023-08-28 DIAGNOSIS — N1831 Chronic kidney disease, stage 3a: Secondary | ICD-10-CM | POA: Diagnosis not present

## 2023-08-28 DIAGNOSIS — L570 Actinic keratosis: Secondary | ICD-10-CM | POA: Diagnosis not present

## 2023-08-28 DIAGNOSIS — N200 Calculus of kidney: Secondary | ICD-10-CM | POA: Diagnosis not present

## 2023-09-03 DIAGNOSIS — L568 Other specified acute skin changes due to ultraviolet radiation: Secondary | ICD-10-CM | POA: Diagnosis not present

## 2023-09-03 DIAGNOSIS — L570 Actinic keratosis: Secondary | ICD-10-CM | POA: Diagnosis not present

## 2023-09-18 DIAGNOSIS — M1711 Unilateral primary osteoarthritis, right knee: Secondary | ICD-10-CM | POA: Diagnosis not present

## 2023-09-24 ENCOUNTER — Encounter: Payer: Self-pay | Admitting: Gastroenterology

## 2023-09-26 DIAGNOSIS — M1711 Unilateral primary osteoarthritis, right knee: Secondary | ICD-10-CM | POA: Diagnosis not present

## 2023-10-01 DIAGNOSIS — M1711 Unilateral primary osteoarthritis, right knee: Secondary | ICD-10-CM | POA: Diagnosis not present

## 2023-10-10 DIAGNOSIS — M1711 Unilateral primary osteoarthritis, right knee: Secondary | ICD-10-CM | POA: Diagnosis not present

## 2023-10-28 NOTE — Progress Notes (Unsigned)
 Referring Provider: Bertell Satterfield, MD Primary Care Physician:  Marvine Rush, MD Primary GI Physician: Dr. Shaaron  Chief Complaint  Patient presents with   Follow-up    Follow up. No problems     HPI:   [name] is a 78 y.o. male with history of IBS, GERD, presenting today for 6 month follow-up.   Last seen in the office 05/17/23. IBS symptoms well controlled on MiraLAX  daily and Levsin  as needed. Discussed that he was overdue for screening colonoscopy as his last colonoscopy was in December 2013; patient stated he would continue to hold off on this unless he developed new symptoms.   Today:  IBS Mixed Type:  Doing well with MiraLAX  and metamucil daily and Levsin  prn.  Used to have Bms daily to every other day. Will go 3 days without a BM, then will have several Bms for 2-3 days, then back to no BM for a few days. No diarrhea or constipation. Stool is soft and formed.  No brbpr or melena.  No abdominal pain. He remains active.  Drinks plenty of water .   2 episodes of diarrhea and cramping since last visit. Levsin  helps significantly.   States he did lose about 10 pounds in the last 6 months or so.  Wife was diagnosed with Crohn's colitis around January of this year and became very weak where he was having to take care of her and all of the housework.  She has been followed at [facility] and is receiving IV infusions for treatment and is doing much better.  States his weight has been stable for the last couple of months since she has been doing better.  GERD:  Well controlled on omeprazole 20 mg daily.  No nausea, vomiting, dysphagia.   Past Medical History:  Diagnosis Date   Arteriosclerotic cardiovascular disease (ASCVD)    CABG in 08/1998  (LIMA-mLAD, SVG-rPDA, SVG-OM3-with Y SVG-lateral OM3.)   Atypical nevus 11/29/2005   slight atypia - left hand   Basal cell carcinoma of skin 03/22/2004   Left scapula, superior - CX3 + 5FU   Basal cell carcinoma of skin 03/22/2004    Left scapula, inferior - CX3 + excision   Basal cell carcinoma of skin 03/22/2004   Center mid back - CX3 + 5FU   Basal cell carcinoma of skin 07/24/2004   Left shin - CX3 + 5FU   Basal cell carcinoma of skin 11/29/2005   Left shoulder - CX3 + 5FU   Basal cell carcinoma of skin 10/22/2006   ulcerated on right outer knee - CX3+5FU   Basal cell carcinoma of skin 12/02/2008   left upper arm, superior - CX3+5FU   Basal cell carcinoma of skin 05/16/2011   right shoulder   Basal cell carcinoma of skin 05/16/2011   ulcerated above left sideburn   Basal cell carcinoma of skin 05/16/2011   lateral left arm   Basal cell carcinoma of skin 06/29/2015   nodular on left ear - tx p bx   Basal cell carcinoma of skin 06/08/2016   superficial on left scapha, ear (MOHs)   Basal cell carcinoma of skin 07/25/2017   nodular on left ear (MOHS)   Basal cell carcinoma of skin 07/25/2017   nodular on left thigh - tx p bx   Basal cell carcinoma of skin 11/18/2018   nodular on right nare - tx p bx   BCC (basal cell carcinoma of skin) 08/25/2019   left upper arm anterior (CX35FU)  BCC (basal cell carcinoma of skin) 08/25/2019   right chin   BPH (benign prostatic hypertrophy)    h/x of prostatis/ UTI in 2006   Chronic kidney disease, stage II (mild) 2010   Creatinine of 1.5 in 2010   GERD (gastroesophageal reflux disease)    s/p espohageal dilatatin and repair of hiatal hernia   Hyperlipidemia    Hypertension    IBS (irritable bowel syndrome)    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   Neck-posterior   PONV (postoperative nausea and vomiting)    Retinal tear of right eye    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   right buccal cheek (CX35FU)   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   right elbow(CX35FU)   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   Right Forearm Upper(well diff) (tx p bx)   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   Right Index Finger(in situ) (tx  p bx)   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   Left Forearm,upper(in situ) (tx p bx)   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/18/2018   Base of Left Middle Finger(in situ) (tx p bx)   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/18/2018   Right Outer Forearm,sup(in situ) (tx p bx)   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/18/2018   Right Forearm,Inf(in situ) (tx p bx)   Sigmoid diverticulosis    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   in situ on right hand - CX3 + 5FU   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   in situ on right ear - CX3 + 5FU   Squamous cell carcinoma of skin 01/21/2006   in situ on upper mid back - tx p bx   Squamous cell carcinoma of skin 01/21/2006   in situ on top left shoulder - tx p bx   Squamous cell carcinoma of skin 10/22/2006   KA under left eye - MOHs   Squamous cell carcinoma of skin 12/02/2008   in situ on left base of 4th finger - CX3+5FU   Squamous cell carcinoma of skin 08/30/2009   well differentiated on right jawline   Squamous cell carcinoma of skin 08/30/2009   in situ on left upper arm - CX3+imiquimod   Squamous cell carcinoma of skin 02/25/2014   in situ on left temple   Squamous cell carcinoma of skin 02/25/2014   in situ on left arm   Squamous cell carcinoma of skin 06/08/2016   in situ on left lower side neck - tx p bx   Squamous cell carcinoma of skin 06/08/2016   left shin - tx p bx   Squamous cell carcinoma of skin 06/08/2016   in situ on right temple - tx p bx   Squamous cell carcinoma of skin 06/08/2016   in situ on right jawline - tx p bx    Past Surgical History:  Procedure Laterality Date   BREAST BIOPSY Right 08/17/2013   Procedure: RIGHT BREAST BIOPSY;  Surgeon: Oneil DELENA Budge, MD;  Location: [facility];  Service: General;  Laterality: Right;   CHOLECYSTECTOMY  1990s   COLONOSCOPY  03/17/2012   Rourk; colonic diverticulosis, repeat screening colonoscopy in 10 years.   CORONARY ARTERY BYPASS GRAFT   04/16/1998   ESOPHAGOGASTRODUODENOSCOPY  08/15/2007   Rourk: Food impaction, critical esophageal ring/stricture requiring dilation   HEMORRHOID SURGERY  04/16/2005   total of three   HIATAL HERNIA REPAIR  04/16/1993   LEFT HEART CATH AND CORS/GRAFTS ANGIOGRAPHY  N/A 08/23/2016   Procedure: Left Heart Cath and Cors/Grafts Angiography;  Surgeon: Anner Alm ORN, MD;  Location: [facility] INVASIVE CV LAB;  Service: Cardiovascular;  Laterality: N/A;   LUMBAR LAMINECTOMY  04/16/1997   RETINAL DETACHMENT SURGERY     TEAR DUCT PROBING WITH STRABISMUS REPAIR      Current Outpatient Medications  Medication Sig Dispense Refill   acetaminophen  (TYLENOL ) 325 MG tablet Take 650 mg by mouth every 6 (six) hours as needed for moderate pain.     amLODipine  (NORVASC ) 10 MG tablet TAKE 1 TABLET BY MOUTH EVERY DAY 90 tablet 2   aspirin  81 MG EC tablet Take 81 mg by mouth at bedtime.     ezetimibe  (ZETIA ) 10 MG tablet TAKE 1 TABLET BY MOUTH EVERY DAY 90 tablet 2   finasteride  (PROSCAR ) 5 MG tablet TAKE 1 TABLET BY MOUTH EVERY DAY IN THE EVENING 90 tablet 3   hyoscyamine  (LEVSIN /SL) 0.125 MG SL tablet Place 1 tablet (0.125 mg total) under the tongue every 6 (six) hours as needed (for abdominal cramping and loose stools.). 30 tablet 1   isosorbide  mononitrate (IMDUR ) 30 MG 24 hr tablet TAKE 1/2 OF A TABLET (15 MG TOTAL) BY MOUTH DAILY 45 tablet 1   omeprazole (PRILOSEC) 20 MG capsule Take 20 mg by mouth 2 (two) times daily. Before LUNCH and Before SUPPER (Patient taking differently: Take 20 mg by mouth daily.)     polyethylene glycol powder (GLYCOLAX /MIRALAX ) 17 GM/SCOOP powder Take 17 g by mouth daily. 255 g 0   pravastatin (PRAVACHOL) 40 MG tablet Take 40 mg by mouth at bedtime.     Probiotic Product (PROBIOTIC DAILY PO) Take 1 capsule by mouth daily.      psyllium (METAMUCIL) 58.6 % powder Take 1 packet by mouth daily.     tamsulosin  (FLOMAX ) 0.4 MG CAPS capsule Take 0.4 mg by mouth at bedtime.     zolpidem (AMBIEN)  10 MG tablet Take 5 tablets by mouth at bedtime.     No current facility-administered medications for this visit.    Allergies as of 10/30/2023 - Review Complete 10/30/2023  Allergen Reaction Noted   Meperidine Nausea And Vomiting and Nausea Only 03/04/2012   Meperidine hcl Nausea Only 06/21/2020   Statins Other (See Comments) 07/23/2016   Codeine Nausea And Vomiting 04/05/2009   Hydralazine   05/06/2019   Nalbuphine Nausea And Vomiting 04/05/2009   Spironolactone   06/22/2019    Family History  Problem Relation Age of Onset   Coronary artery disease Father        also grandfather   Colon cancer Neg Hx     Social History   Socioeconomic History   Marital status: Married    Spouse name: Not on file   Number of children: Not on file   Years of education: Not on file   Highest education level: Not on file  Occupational History   Occupation: Public affairs consultant    Employer: [employer]  Tobacco Use   Smoking status: Never   Smokeless tobacco: Never   Tobacco comments:    no use of tobacco products  Vaping Use   Vaping status: Never Used  Substance and Sexual Activity   Alcohol use: Not Currently    Alcohol/week: 2.0 standard drinks of alcohol    Types: 2 Standard drinks or equivalent per week    Comment: Occasionally   Drug use: Never   Sexual activity: Yes    Partners: Female  Other Topics Concern   Not  on file  Social History Narrative   Previously  Employed as an Public affairs consultant.    Social Drivers of Corporate investment banker Strain: Not on file  Food Insecurity: Not on file  Transportation Needs: Not on file  Physical Activity: Not on file  Stress: Not on file  Social Connections: Not on file    Review of Systems: Gen: Denies fever, chills, cold or flulike symptoms, presyncope, syncope. CV: Denies chest pain, palpitations. Resp: Denies dyspnea, cough. GI: See HPI Heme: See HPI  Physical Exam: BP 138/65 (BP Location: Left Arm, Patient  Position: Sitting, Cuff Size: Normal)   Pulse 68   Temp 97.7 F (36.5 C) (Temporal)   Ht 5' 8 (1.727 m)   Wt 150 lb (68 kg)   BMI 22.81 kg/m  General:   Alert and oriented. No distress noted. Pleasant and cooperative.  Head:  Normocephalic and atraumatic. Eyes:  Conjuctiva clear without scleral icterus. Heart:  S1, S2 present without murmurs appreciated. Lungs:  Clear to auscultation bilaterally. No wheezes, rales, or rhonchi. No distress.  Abdomen:  +BS, soft, non-tender and non-distended. No rebound or guarding. No HSM or masses noted.  Msk:  Symmetrical without gross deformities. Normal posture. Extremities:  Without edema. Neurologic:  Alert and  oriented x4 Psych:  Normal mood and affect.    Assessment:  78 y.o. male presenting today for follow-up of IBS mixed type and GERD.  IBS mixed type: Fairly well-controlled with MiraLAX  and Metamucil daily, Levsin  as needed, but has noted skipping 2 to 3 days without a bowel movement, then having several formed bowel movements for a couple of days.  Suspect this is more likely an overflow picture in the setting of mild constipation.  He has no alarm symptoms.  He has lost about 10 pounds since beginning of the year, but attributes this to his wife becoming ill, new diagnosis of Crohn's, and having to take care of her along with all the housework, but reports weight has been stable for the last couple months and she has been doing better.  We have previously discussed updating his colonoscopy as he is overdue for screening as his last colonoscopy was in 2013, but patient prefers to hold off on this unless he develops bleeding or other significant bowel changes.  GERD: Well-controlled on omeprazole 20 mg daily.   Plan:  Increase Metamucil to 1 tablespoon twice daily. After a couple weeks of increased dose of Metamucil, if he is still not having a bowel movement every day, advised that he can increase MiraLAX  to one half capfuls daily or 1  capful twice a day. He will let me know if he has any ongoing issues with bowel regularity. Continue Levsin  as needed Continue omeprazole 20 mg daily. Follow-up in 6 months or sooner if needed.   Josette Centers, PA-C [facility] 10/30/2023

## 2023-10-30 ENCOUNTER — Encounter: Payer: Self-pay | Admitting: Gastroenterology

## 2023-10-30 ENCOUNTER — Ambulatory Visit: Admitting: Gastroenterology

## 2023-10-30 VITALS — BP 138/65 | HR 68 | Temp 97.7°F | Ht 68.0 in | Wt 150.0 lb

## 2023-10-30 DIAGNOSIS — K219 Gastro-esophageal reflux disease without esophagitis: Secondary | ICD-10-CM | POA: Diagnosis not present

## 2023-10-30 DIAGNOSIS — K582 Mixed irritable bowel syndrome: Secondary | ICD-10-CM

## 2023-10-30 NOTE — Patient Instructions (Addendum)
 Try increasing metamucil to 1 tablespoon twice daily. After a couple of weeks, if you still aren't having a bowel movement every day, try increasing MiraLAX  to 1.5 capfuls daily or 1 capful twice a day.   If you continue to have trouble with your bowels, please let me know.   Continue omeprazole 20 mg daily for GERD.   I will see you back in 6 months or sooner if needed.   Josette Centers, PA-C [facility]

## 2023-10-31 ENCOUNTER — Encounter (INDEPENDENT_AMBULATORY_CARE_PROVIDER_SITE_OTHER): Payer: Medicare HMO | Admitting: Ophthalmology

## 2023-10-31 DIAGNOSIS — H43813 Vitreous degeneration, bilateral: Secondary | ICD-10-CM | POA: Diagnosis not present

## 2023-10-31 DIAGNOSIS — H35342 Macular cyst, hole, or pseudohole, left eye: Secondary | ICD-10-CM

## 2023-10-31 DIAGNOSIS — H35033 Hypertensive retinopathy, bilateral: Secondary | ICD-10-CM

## 2023-10-31 DIAGNOSIS — I10 Essential (primary) hypertension: Secondary | ICD-10-CM

## 2023-10-31 DIAGNOSIS — H33301 Unspecified retinal break, right eye: Secondary | ICD-10-CM

## 2023-11-12 DIAGNOSIS — D0472 Carcinoma in situ of skin of left lower limb, including hip: Secondary | ICD-10-CM | POA: Diagnosis not present

## 2023-11-12 DIAGNOSIS — L570 Actinic keratosis: Secondary | ICD-10-CM | POA: Diagnosis not present

## 2023-11-12 DIAGNOSIS — L568 Other specified acute skin changes due to ultraviolet radiation: Secondary | ICD-10-CM | POA: Diagnosis not present

## 2023-11-12 DIAGNOSIS — D485 Neoplasm of uncertain behavior of skin: Secondary | ICD-10-CM | POA: Diagnosis not present

## 2023-12-08 ENCOUNTER — Other Ambulatory Visit: Payer: Self-pay | Admitting: Cardiology

## 2023-12-28 ENCOUNTER — Emergency Department (HOSPITAL_COMMUNITY)
Admission: EM | Admit: 2023-12-28 | Discharge: 2023-12-28 | Disposition: A | Discharge status: Home / Self Care | Attending: Emergency Medicine | Admitting: Emergency Medicine

## 2023-12-28 ENCOUNTER — Emergency Department (HOSPITAL_COMMUNITY)

## 2023-12-28 ENCOUNTER — Other Ambulatory Visit: Payer: Self-pay

## 2023-12-28 ENCOUNTER — Encounter (HOSPITAL_COMMUNITY): Payer: Self-pay

## 2023-12-28 DIAGNOSIS — Z7982 Long term (current) use of aspirin: Secondary | ICD-10-CM | POA: Insufficient documentation

## 2023-12-28 DIAGNOSIS — I2510 Atherosclerotic heart disease of native coronary artery without angina pectoris: Secondary | ICD-10-CM | POA: Insufficient documentation

## 2023-12-28 DIAGNOSIS — R0789 Other chest pain: Secondary | ICD-10-CM | POA: Insufficient documentation

## 2023-12-28 DIAGNOSIS — R079 Chest pain, unspecified: Secondary | ICD-10-CM | POA: Diagnosis not present

## 2023-12-28 DIAGNOSIS — K449 Diaphragmatic hernia without obstruction or gangrene: Secondary | ICD-10-CM | POA: Diagnosis not present

## 2023-12-28 LAB — BASIC METABOLIC PANEL WITH GFR
Anion gap: 10 (ref 5–15)
BUN: 18 mg/dL (ref 8–23)
CO2: 26 mmol/L (ref 22–32)
Calcium: 9.5 mg/dL (ref 8.9–10.3)
Chloride: 102 mmol/L (ref 98–111)
Creatinine, Ser: 1.31 mg/dL — ABNORMAL HIGH (ref 0.61–1.24)
GFR, Estimated: 56 mL/min — ABNORMAL LOW (ref >60)
Glucose, Bld: 106 mg/dL — ABNORMAL HIGH (ref 70–99)
Potassium: 4.4 mmol/L (ref 3.5–5.1)
Sodium: 138 mmol/L (ref 135–145)

## 2023-12-28 LAB — CBC
HCT: 41.2 % (ref 39.0–52.0)
Hemoglobin: 13.9 g/dL (ref 13.0–17.0)
MCH: 33.3 pg (ref 26.0–34.0)
MCHC: 33.7 g/dL (ref 30.0–36.0)
MCV: 98.6 fL (ref 80.0–100.0)
Platelets: 188 K/uL (ref 150–400)
RBC: 4.18 MIL/uL — ABNORMAL LOW (ref 4.22–5.81)
RDW: 12.8 % (ref 11.5–15.5)
WBC: 8.4 K/uL (ref 4.0–10.5)
nRBC: 0 % (ref 0.0–0.2)

## 2023-12-28 LAB — TROPONIN I (HIGH SENSITIVITY)
Troponin I (High Sensitivity): 7 ng/L (ref <18)
Troponin I (High Sensitivity): 7 ng/L (ref <18)

## 2023-12-28 MED ORDER — NITROGLYCERIN 0.4 MG SL SUBL
0.4000 mg | SUBLINGUAL_TABLET | Freq: Once | SUBLINGUAL | Status: AC
  Administered 2023-12-28: 0.4 mg via SUBLINGUAL
  Filled 2023-12-28: qty 1

## 2023-12-28 MED ORDER — SODIUM CHLORIDE 0.9 % IV BOLUS
500.0000 mL | Freq: Once | INTRAVENOUS | Status: AC
  Administered 2023-12-28: 500 mL via INTRAVENOUS

## 2023-12-28 NOTE — ED Notes (Addendum)
 PA's CB and JI, both looked at pts EKG while pt was moved back to the waiting room, safe for patient to wait for a room at this time. MD unavailable.

## 2023-12-28 NOTE — ED Triage Notes (Signed)
 Pt stated that he has been having chest pain that comes and goes for the last week. Pt stated that the pain going back and forth between R and L chest

## 2023-12-28 NOTE — Discharge Instructions (Signed)
 Follow-up with your cardiologist this week.  Call Monday for an appointment.  Return sooner problems and just rest over the weekend

## 2023-12-28 NOTE — ED Provider Notes (Signed)
 [place] EMERGENCY DEPARTMENT AT [facility] Provider Note   CSN: [id number] Arrival date & time: 12/28/23  1328     Patient presents with: Chest Pain   [name] is a 78 y.o. male.   Patient with a history of coronary artery disease and a hiatal hernia.  Patient complains of chest pain periodically this week but it occurs at rest and not when he is exerting himself  The history is provided by the patient and medical records. No language interpreter was used.  Chest Pain Pain location:  L chest Pain quality: aching   Pain radiates to:  L jaw Pain severity:  Mild Onset quality:  Sudden Timing:  Intermittent Chronicity:  Recurrent Context: not breathing   Associated symptoms: no abdominal pain, no back pain, no cough, no fatigue and no headache        Prior to Admission medications   Medication Sig Start Date End Date Taking? Authorizing Provider  acetaminophen  (TYLENOL ) 325 MG tablet Take 650 mg by mouth every 6 (six) hours as needed for moderate pain.    [provider]  amLODipine  (NORVASC ) 10 MG tablet TAKE 1 TABLET BY MOUTH EVERY DAY 07/08/23   Alvan Dorn FALCON, MD  aspirin  81 MG EC tablet Take 81 mg by mouth at bedtime.    [provider]  ezetimibe  (ZETIA ) 10 MG tablet TAKE 1 TABLET BY MOUTH EVERY DAY 07/08/23   Alvan Dorn FALCON, MD  finasteride  (PROSCAR ) 5 MG tablet TAKE 1 TABLET BY MOUTH EVERY DAY IN THE EVENING 03/22/23   Matilda Senior, MD  hyoscyamine  (LEVSIN AMIEL) 0.125 MG SL tablet Place 1 tablet (0.125 mg total) under the tongue every 6 (six) hours as needed (for abdominal cramping and loose stools.). 03/11/23   Rudy Josette RAMAN, PA-C  isosorbide  mononitrate (IMDUR ) 30 MG 24 hr tablet TAKE 1/2 OF A TABLET (15 MG TOTAL) BY MOUTH DAILY 12/10/23   Alvan Dorn FALCON, MD  omeprazole (PRILOSEC) 20 MG capsule Take 20 mg by mouth 2 (two) times daily. Before LUNCH and Before SUPPER Patient taking differently: Take 20 mg by mouth  daily.    [provider]  polyethylene glycol powder (GLYCOLAX /MIRALAX ) 17 GM/SCOOP powder Take 17 g by mouth daily. 03/04/22   Nivia Colon, PA-C  pravastatin (PRAVACHOL) 40 MG tablet Take 40 mg by mouth at bedtime. 03/31/21   [provider]  Probiotic Product (PROBIOTIC DAILY PO) Take 1 capsule by mouth daily.     [provider]  psyllium (METAMUCIL) 58.6 % powder Take 1 packet by mouth daily.    [provider]  tamsulosin  (FLOMAX ) 0.4 MG CAPS capsule Take 0.4 mg by mouth at bedtime.    [provider]  zolpidem (AMBIEN) 10 MG tablet Take 5 tablets by mouth at bedtime. 04/26/18   [provider]    Allergies: Meperidine, Meperidine hcl, Statins, Codeine, Hydralazine , Nalbuphine, and Spironolactone     Review of Systems  Constitutional:  Negative for appetite change and fatigue.  HENT:  Negative for congestion, ear discharge and sinus pressure.   Eyes:  Negative for discharge.  Respiratory:  Negative for cough.   Cardiovascular:  Positive for chest pain.  Gastrointestinal:  Negative for abdominal pain and diarrhea.  Genitourinary:  Negative for frequency and hematuria.  Musculoskeletal:  Negative for back pain.  Skin:  Negative for rash.  Neurological:  Negative for seizures and headaches.  Psychiatric/Behavioral:  Negative for hallucinations.     Updated Vital Signs BP (!) 142/71  Pulse (!) 50   Temp 98 F (36.7 C) (Oral)   Resp 16   SpO2 100%   Physical Exam Vitals and nursing note reviewed.  Constitutional:      Appearance: He is well-developed.  HENT:     Head: Normocephalic.     Nose: Nose normal.  Eyes:     General: No scleral icterus.    Conjunctiva/sclera: Conjunctivae normal.  Neck:     Thyroid : No thyromegaly.  Cardiovascular:     Rate and Rhythm: Normal rate and regular rhythm.     Heart sounds: No murmur heard.    No friction rub. No gallop.  Pulmonary:     Breath sounds: No stridor. No wheezing or  rales.  Chest:     Chest wall: No tenderness.  Abdominal:     General: There is no distension.     Tenderness: There is no abdominal tenderness. There is no rebound.  Musculoskeletal:        General: Normal range of motion.     Cervical back: Neck supple.  Lymphadenopathy:     Cervical: No cervical adenopathy.  Skin:    Findings: No erythema or rash.  Neurological:     Mental Status: He is alert and oriented to person, place, and time.     Motor: No abnormal muscle tone.     Coordination: Coordination normal.  Psychiatric:        Behavior: Behavior normal.     (all labs ordered are listed, but only abnormal results are displayed) Labs Reviewed  BASIC METABOLIC PANEL WITH GFR - Abnormal; Notable for the following components:      Result Value   Glucose, Bld 106 (*)    Creatinine, Ser 1.31 (*)    GFR, Estimated 56 (*)    All other components within normal limits  CBC - Abnormal; Notable for the following components:   RBC 4.18 (*)    All other components within normal limits  TROPONIN I (HIGH SENSITIVITY)  TROPONIN I (HIGH SENSITIVITY)    EKG: EKG Interpretation Date/Time:  Saturday December 28 2023 13:40:02 EDT Ventricular Rate:  68 PR Interval:  162 QRS Duration:  112 QT Interval:  416 QTC Calculation: 442 R Axis:   25  Text Interpretation: Sinus rhythm with Premature atrial complexes Incomplete right bundle branch block Possible Inferior infarct , age undetermined Abnormal ECG When compared with ECG of 18-Jun-2019 09:47, PREVIOUS ECG IS PRESENT Confirmed by Suzette Pac [phone]) on 12/28/2023 6:45:56 PM  Radiology: [facility] Chest Port 1 View Result Date: 12/28/2023 CLINICAL DATA:  Chest pain. EXAM: PORTABLE CHEST 1 VIEW COMPARISON:  Chest radiograph dated 03/04/2022. FINDINGS: No focal consolidation, pleural effusion, or pneumothorax. Stable cardiac silhouette. Median sternotomy wires and CABG vascular clips. Hiatal hernia. No acute osseous pathology. IMPRESSION: 1. No  active disease. 2. Hiatal hernia. Electronically Signed   By: Vanetta Chou M.D.   On: 12/28/2023 15:12     Procedures   Medications Ordered in the ED  nitroGLYCERIN  (NITROSTAT ) SL tablet 0.4 mg (0.4 mg Sublingual Given 12/28/23 1557)  sodium chloride  0.9 % bolus 500 mL (0 mLs Intravenous Stopped 12/28/23 1659)  Patient has normal troponins, no acute changes on his EKG.  Not having chest pain now.                                  Medical Decision Making Amount and/or Complexity of Data Reviewed Labs: ordered.  Risk Prescription drug management.   Patient with atypical chest pain that has resolved.  He will follow-up with his cardiologist next week and return sooner problems     Final diagnoses:  Atypical chest pain    ED Discharge Orders     None          Suzette Pac, MD 12/30/23 1158

## 2023-12-30 DIAGNOSIS — D0472 Carcinoma in situ of skin of left lower limb, including hip: Secondary | ICD-10-CM | POA: Diagnosis not present

## 2023-12-30 DIAGNOSIS — I872 Venous insufficiency (chronic) (peripheral): Secondary | ICD-10-CM | POA: Diagnosis not present

## 2023-12-31 ENCOUNTER — Telehealth: Payer: Self-pay | Admitting: Cardiology

## 2023-12-31 NOTE — Telephone Encounter (Signed)
 Pt called in stating he was in the ED last night for CP, they told him it was not cardiac related but advised he still f/u with Dr. Alvan. He is scheduled for yearly follow up 10/23. There is no opening before then. He asked that I send a message to get Dr. Ranae suggestions of if he needs to do anything in the meantime.

## 2024-01-02 NOTE — Telephone Encounter (Signed)
 Patient informed and verbalized understanding of plan.

## 2024-01-02 NOTE — Telephone Encounter (Signed)
 Incoming call disconnected tried to call patient back 2 times with no answer

## 2024-01-02 NOTE — Telephone Encounter (Signed)
 Left patient a message to call back.

## 2024-01-07 ENCOUNTER — Encounter: Payer: Self-pay | Admitting: Cardiology

## 2024-01-07 ENCOUNTER — Ambulatory Visit: Attending: Cardiology | Admitting: Cardiology

## 2024-01-07 VITALS — BP 134/72 | HR 66 | Ht 68.0 in | Wt 152.0 lb

## 2024-01-07 DIAGNOSIS — E782 Mixed hyperlipidemia: Secondary | ICD-10-CM

## 2024-01-07 DIAGNOSIS — I2510 Atherosclerotic heart disease of native coronary artery without angina pectoris: Secondary | ICD-10-CM | POA: Diagnosis not present

## 2024-01-07 DIAGNOSIS — I10 Essential (primary) hypertension: Secondary | ICD-10-CM | POA: Diagnosis not present

## 2024-01-07 NOTE — Patient Instructions (Signed)

## 2024-01-07 NOTE — Progress Notes (Signed)
 Clinical Summary [name] is a 78 y.o.male seen today for follow up of the following medical problems.      1. CAD - CABG in 2000 - 06/2015 completed echo which showed normal LVEF. Exercise MPI he did 8 min 25 sec with no EKG changes and no ischemia by imaging. - has not been on ACE-I since prior admission with AKI, had also been on NSAIDs at that time.    - admit 08/2016 with NSTEMI 08/2016 cath: No obvious culprit lesions found. Clearly has small vessel disease involving the nongrafted diagonal [name] but this is not a very approachable PCI target. The other potential culprit is the severe disease in the retrograde flow from SVG-RPDA back to the PL system. Is also not PCI target .  - lopressor  later stopped due to bradycardia     - ER visit 12/2023 with chest pain -symptoms for about week. Could be left or right sided, could be lower back at times. Dull pain, 4-5/10 in severity. No other associated symptoms. Did not notice with exetiona, typically would notice after being active shen sitting still. Symptoms would come and go. Not positional. Had driving tractor raking hay turing large some stiff steering wheel for extended periods of time around the time the pain started.  - trop neg x 2. EKG, RBBB, no acute ischemic changes - CXR no acute process - no recurrent symptoms since.    2. HTN - metoprolol  stopped due to low HR's in 09/2017 - prior AKI on ACE-I, though heavy NSAID use at the time also -listed hydralazine , aldactone  allergies.      - we previously increased norvasc  to 10mg  daily. Reports some prior swelling in the past on norvasc  but has tolerated reasonablly well since then  - home bp's typically 130s/60s     3. Hyperlipidemia - leg pains on vytorin . He is on pravastatin 03/2022 TC 177 TG 99 HDL 57 LDL 102 - he is concerned about higher doses of statin    10/2022 TC 148 TG 95 HDL 55 LDL 75 -can have some muscle aches at times.    4. CKD - followed by  nephrology by Dr Rachele  Last Cr up to 1.22 02/2022 1.22   5. LE edema -imprioved with prn lasix, had recent varicose vein procedure which helped alot - no recent edema   6. Varicose veins - prior intervention   SH: wife with Crohns colitis.  Past Medical History:  Diagnosis Date   Arteriosclerotic cardiovascular disease (ASCVD)    CABG in 08/1998  (LIMA-mLAD, SVG-rPDA, SVG-OM3-with Y SVG-lateral OM3.)   Atypical nevus 11/29/2005   slight atypia - left hand   Basal cell carcinoma of skin 03/22/2004   Left scapula, superior - CX3 + 5FU   Basal cell carcinoma of skin 03/22/2004   Left scapula, inferior - CX3 + excision   Basal cell carcinoma of skin 03/22/2004   Center mid back - CX3 + 5FU   Basal cell carcinoma of skin 07/24/2004   Left shin - CX3 + 5FU   Basal cell carcinoma of skin 11/29/2005   Left shoulder - CX3 + 5FU   Basal cell carcinoma of skin 10/22/2006   ulcerated on right outer knee - CX3+5FU   Basal cell carcinoma of skin 12/02/2008   left upper arm, superior - CX3+5FU   Basal cell carcinoma of skin 05/16/2011   right shoulder   Basal cell carcinoma of skin 05/16/2011   ulcerated above left sideburn  Basal cell carcinoma of skin 05/16/2011   lateral left arm   Basal cell carcinoma of skin 06/29/2015   nodular on left ear - tx p bx   Basal cell carcinoma of skin 06/08/2016   superficial on left scapha, ear (MOHs)   Basal cell carcinoma of skin 07/25/2017   nodular on left ear (MOHS)   Basal cell carcinoma of skin 07/25/2017   nodular on left thigh - tx p bx   Basal cell carcinoma of skin 11/18/2018   nodular on right nare - tx p bx   BCC (basal cell carcinoma of skin) 08/25/2019   left upper arm anterior (CX35FU)   BCC (basal cell carcinoma of skin) 08/25/2019   right chin   BPH (benign prostatic hypertrophy)    h/x of prostatis/ UTI in 2006   Chronic kidney disease, stage II (mild) 2010   Creatinine of 1.5 in 2010   GERD (gastroesophageal reflux  disease)    s/p espohageal dilatatin and repair of hiatal hernia   Hyperlipidemia    Hypertension    IBS (irritable bowel syndrome)    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   Neck-posterior   PONV (postoperative nausea and vomiting)    Retinal tear of right eye    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   right buccal cheek (CX35FU)   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   right elbow(CX35FU)   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   Right Forearm Upper(well diff) (tx p bx)   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   Right Index Finger(in situ) (tx p bx)   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   Left Forearm,upper(in situ) (tx p bx)   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/18/2018   Base of Left Middle Finger(in situ) (tx p bx)   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/18/2018   Right Outer Forearm,sup(in situ) (tx p bx)   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/18/2018   Right Forearm,Inf(in situ) (tx p bx)   Sigmoid diverticulosis    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   in situ on right hand - CX3 + 5FU   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   in situ on right ear - CX3 + 5FU   Squamous cell carcinoma of skin 01/21/2006   in situ on upper mid back - tx p bx   Squamous cell carcinoma of skin 01/21/2006   in situ on top left shoulder - tx p bx   Squamous cell carcinoma of skin 10/22/2006   KA under left eye - MOHs   Squamous cell carcinoma of skin 12/02/2008   in situ on left base of 4th finger - CX3+5FU   Squamous cell carcinoma of skin 08/30/2009   well differentiated on right jawline   Squamous cell carcinoma of skin 08/30/2009   in situ on left upper arm - CX3+imiquimod   Squamous cell carcinoma of skin 02/25/2014   in situ on left temple   Squamous cell carcinoma of skin 02/25/2014   in situ on left arm   Squamous cell carcinoma of skin 06/08/2016   in situ on left lower side neck - tx p bx    Squamous cell carcinoma of skin 06/08/2016   left shin - tx p bx   Squamous cell carcinoma of skin 06/08/2016   in situ on right temple - tx p bx   Squamous cell carcinoma of skin 06/08/2016   in situ on  right jawline - tx p bx     Allergies  Allergen Reactions   Meperidine Nausea And Vomiting and Nausea Only   Meperidine Hcl Nausea Only   Statins Other (See Comments)    Severe muscle pain   Codeine Nausea And Vomiting   Hydralazine      neuropathy    Nalbuphine Nausea And Vomiting   Spironolactone      MUSCLE PAIN      Current Outpatient Medications  Medication Sig Dispense Refill   acetaminophen  (TYLENOL ) 325 MG tablet Take 650 mg by mouth every 6 (six) hours as needed for moderate pain.     amLODipine  (NORVASC ) 10 MG tablet TAKE 1 TABLET BY MOUTH EVERY DAY 90 tablet 2   aspirin  81 MG EC tablet Take 81 mg by mouth at bedtime.     ezetimibe  (ZETIA ) 10 MG tablet TAKE 1 TABLET BY MOUTH EVERY DAY 90 tablet 2   finasteride  (PROSCAR ) 5 MG tablet TAKE 1 TABLET BY MOUTH EVERY DAY IN THE EVENING 90 tablet 3   hyoscyamine  (LEVSIN /SL) 0.125 MG SL tablet Place 1 tablet (0.125 mg total) under the tongue every 6 (six) hours as needed (for abdominal cramping and loose stools.). 30 tablet 1   isosorbide  mononitrate (IMDUR ) 30 MG 24 hr tablet TAKE 1/2 OF A TABLET (15 MG TOTAL) BY MOUTH DAILY 45 tablet 1   omeprazole (PRILOSEC) 20 MG capsule Take 20 mg by mouth 2 (two) times daily. Before LUNCH and Before SUPPER     polyethylene glycol powder (GLYCOLAX /MIRALAX ) 17 GM/SCOOP powder Take 17 g by mouth daily. 255 g 0   pravastatin (PRAVACHOL) 40 MG tablet Take 40 mg by mouth at bedtime.     Probiotic Product (PROBIOTIC DAILY PO) Take 1 capsule by mouth daily.      psyllium (METAMUCIL) 58.6 % powder Take 1 packet by mouth daily.     tamsulosin  (FLOMAX ) 0.4 MG CAPS capsule Take 0.4 mg by mouth at bedtime.     zolpidem (AMBIEN) 10 MG tablet Take 5 tablets by mouth at bedtime.     No current  facility-administered medications for this visit.     Past Surgical History:  Procedure Laterality Date   BREAST BIOPSY Right 08/17/2013   Procedure: RIGHT BREAST BIOPSY;  Surgeon: Oneil DELENA Budge, MD;  Location: [facility];  Service: General;  Laterality: Right;   CHOLECYSTECTOMY  1990s   COLONOSCOPY  03/17/2012   [place]; colonic diverticulosis, repeat screening colonoscopy in 10 years.   CORONARY ARTERY BYPASS GRAFT  04/16/1998   ESOPHAGOGASTRODUODENOSCOPY  08/15/2007   [place]: Food impaction, critical esophageal ring/stricture requiring dilation   HEMORRHOID SURGERY  04/16/2005   total of three   HIATAL HERNIA REPAIR  04/16/1993   LEFT HEART CATH AND CORS/GRAFTS ANGIOGRAPHY N/A 08/23/2016   Procedure: Left Heart Cath and Cors/Grafts Angiography;  Surgeon: Anner Alm ORN, MD;  Location: [facility] CV [facility];  Service: Cardiovascular;  Laterality: N/A;   LUMBAR LAMINECTOMY  04/16/1997   RETINAL DETACHMENT SURGERY     TEAR DUCT PROBING WITH STRABISMUS REPAIR       Allergies  Allergen Reactions   Meperidine Nausea And Vomiting and Nausea Only   Meperidine Hcl Nausea Only   Statins Other (See Comments)    Severe muscle pain   Codeine Nausea And Vomiting   Hydralazine      neuropathy    Nalbuphine Nausea And Vomiting   Spironolactone      MUSCLE PAIN       Family History  Problem  Relation Age of Onset   Coronary artery disease Father        also grandfather   Colon cancer Neg Hx      Social History Mr. Lopes reports that he has never smoked. He has never used smokeless tobacco. Mr. Dirocco reports that he does not currently use alcohol after a past usage of about 2.0 standard drinks of alcohol per week.   Physical Examination Today's Vitals   01/07/24 1513 01/07/24 1602  BP: (!) 144/58 134/72  Pulse: 66   SpO2: 98%   Weight: 152 lb (68.9 kg)   Height: 5' 8 (1.727 m)    Body mass index is 23.11 kg/m.  Gen: resting comfortably, no acute distress HEENT: no scleral  icterus, pupils equal round and reactive, no palptable cervical adenopathy,  CV: RRR, no mrg, no jvd Resp: Clear to auscultation bilaterally GI: abdomen is soft, non-tender, non-distended, normal bowel sounds, no hepatosplenomegaly MSK: extremities are warm, no edema.  Skin: warm, no rash Neuro:  no focal deficits Psych: appropriate affect   Diagnostic Studies  06/2015 echo Study Conclusions   - Left ventricle: The cavity size was normal. Wall thickness was   increased increased in a pattern of mild to moderate LVH.   Systolic function was normal. The estimated ejection fraction was   in the range of 60% to 65%. Wall motion was normal; there were no   regional wall motion abnormalities. Left ventricular diastolic   function parameters were normal. - Aortic valve: Mildly calcified annulus. Trileaflet; mildly   thickened leaflets. Valve area (VTI): 2.58 cm^2. Valve area   (Vmax): 2.89 cm^2. - Mitral valve: There was mild regurgitation. - Systemic veins: Small IVC, suggesting low RA pressures and   hypovolemia. - Technically adequate study.     06/2015 Exercise Stress MPI Blood pressure demonstrated a hypertensive response to exercise. The study is normal. This is a low risk study. The left ventricular ejection fraction is normal (55-65%).     08/2016 cath Mid RCA lesion, 80 %stenosed. Dist RCA lesion, 100 %stenosed. SVG-mRPDA graft was visualized by angiography and is large and anatomically normal. Potential Culpril (not PCI Target). Ost RPDA lesion, 90 %stenosed, Ost RPDA to RPDA lesion, 70 %stenosed.- filled retrograde from SVG Ost 3rd Mrg to 3rd Mrg lesion, 80 %stenosed. SVG-3rd OM graft was visualized by angiography and is large and anatomically normal. Y Graft SVG-Lat 3rd OM is moderate in size and anatomically normal. Origin lesion, 40 %stenosed. Ost LAD to Prox LAD lesion, 80 %stenosed. Prox LAD to Mid LAD lesion, 99 %stenosed. LIMA graft was visualized by angiography  and is normal in caliber and anatomically normal. The left ventricular systolic function is normal. The left ventricular ejection fraction is 55-65% by visual estimate. LV end diastolic pressure is normal.   No obvious culprit lesions found. Clearly has small vessel disease involving the nongrafted diagonal [name] but this is not a very approachable PCI target. The other potential culprit is the severe disease in the retrograde flow from SVG-RPDA back to the PL system. Is also not PCI target.   Recommend: Optimize medical management   Assessment and Plan   1. CAD - recent atypical symptoms most likely MSK related pains, negative evaluation in the ER. Symptoms have since resolved - monitor at this time, continue current meds   2. HTN - Bradycardia on beta blockers, AKI on ACEI, also avoiding diuretic due to renal dysfunction. Allergies to hydralazine  and aldactone .  - bp overall at goal, continue  current meds     3. Hyperlipidemia -he is hesitant to increase pravastatin due to concern for side effects.  -LDL reasonably controlled on pravastatin 30m and zetia  10mg  daily,has some mild inconsistent muscle aches - somewhat resistant toward [id number]'s, continue statin and zetia  at this time  F/u 6 months     Dorn PHEBE Ross, M.D.

## 2024-01-09 ENCOUNTER — Ambulatory Visit: Admitting: Cardiology

## 2024-01-14 DIAGNOSIS — M1812 Unilateral primary osteoarthritis of first carpometacarpal joint, left hand: Secondary | ICD-10-CM | POA: Diagnosis not present

## 2024-02-03 DIAGNOSIS — N183 Chronic kidney disease, stage 3 unspecified: Secondary | ICD-10-CM | POA: Diagnosis not present

## 2024-02-03 DIAGNOSIS — E785 Hyperlipidemia, unspecified: Secondary | ICD-10-CM | POA: Diagnosis not present

## 2024-02-03 DIAGNOSIS — R7309 Other abnormal glucose: Secondary | ICD-10-CM | POA: Diagnosis not present

## 2024-02-03 DIAGNOSIS — G4700 Insomnia, unspecified: Secondary | ICD-10-CM | POA: Diagnosis not present

## 2024-02-03 DIAGNOSIS — I10 Essential (primary) hypertension: Secondary | ICD-10-CM | POA: Diagnosis not present

## 2024-02-03 DIAGNOSIS — M792 Neuralgia and neuritis, unspecified: Secondary | ICD-10-CM | POA: Diagnosis not present

## 2024-02-06 ENCOUNTER — Ambulatory Visit: Admitting: Cardiology

## 2024-02-11 DIAGNOSIS — N189 Chronic kidney disease, unspecified: Secondary | ICD-10-CM | POA: Diagnosis not present

## 2024-02-11 DIAGNOSIS — D631 Anemia in chronic kidney disease: Secondary | ICD-10-CM | POA: Diagnosis not present

## 2024-02-11 DIAGNOSIS — D472 Monoclonal gammopathy: Secondary | ICD-10-CM | POA: Diagnosis not present

## 2024-02-11 DIAGNOSIS — R809 Proteinuria, unspecified: Secondary | ICD-10-CM | POA: Diagnosis not present

## 2024-02-12 DIAGNOSIS — L570 Actinic keratosis: Secondary | ICD-10-CM | POA: Diagnosis not present

## 2024-02-12 DIAGNOSIS — D485 Neoplasm of uncertain behavior of skin: Secondary | ICD-10-CM | POA: Diagnosis not present

## 2024-02-12 DIAGNOSIS — L568 Other specified acute skin changes due to ultraviolet radiation: Secondary | ICD-10-CM | POA: Diagnosis not present

## 2024-02-12 DIAGNOSIS — D0461 Carcinoma in situ of skin of right upper limb, including shoulder: Secondary | ICD-10-CM | POA: Diagnosis not present

## 2024-02-13 DIAGNOSIS — Z48817 Encounter for surgical aftercare following surgery on the skin and subcutaneous tissue: Secondary | ICD-10-CM | POA: Diagnosis not present

## 2024-02-25 DIAGNOSIS — D0461 Carcinoma in situ of skin of right upper limb, including shoulder: Secondary | ICD-10-CM | POA: Diagnosis not present

## 2024-02-25 DIAGNOSIS — N1831 Chronic kidney disease, stage 3a: Secondary | ICD-10-CM | POA: Diagnosis not present

## 2024-02-25 DIAGNOSIS — R809 Proteinuria, unspecified: Secondary | ICD-10-CM | POA: Diagnosis not present

## 2024-02-25 DIAGNOSIS — I129 Hypertensive chronic kidney disease with stage 1 through stage 4 chronic kidney disease, or unspecified chronic kidney disease: Secondary | ICD-10-CM | POA: Diagnosis not present

## 2024-02-26 DIAGNOSIS — M47812 Spondylosis without myelopathy or radiculopathy, cervical region: Secondary | ICD-10-CM | POA: Diagnosis not present

## 2024-02-26 DIAGNOSIS — M9903 Segmental and somatic dysfunction of lumbar region: Secondary | ICD-10-CM | POA: Diagnosis not present

## 2024-02-26 DIAGNOSIS — M9901 Segmental and somatic dysfunction of cervical region: Secondary | ICD-10-CM | POA: Diagnosis not present

## 2024-02-26 DIAGNOSIS — M546 Pain in thoracic spine: Secondary | ICD-10-CM | POA: Diagnosis not present

## 2024-02-26 DIAGNOSIS — M5441 Lumbago with sciatica, right side: Secondary | ICD-10-CM | POA: Diagnosis not present

## 2024-02-26 DIAGNOSIS — M47816 Spondylosis without myelopathy or radiculopathy, lumbar region: Secondary | ICD-10-CM | POA: Diagnosis not present

## 2024-02-26 DIAGNOSIS — M9902 Segmental and somatic dysfunction of thoracic region: Secondary | ICD-10-CM | POA: Diagnosis not present

## 2024-02-27 DIAGNOSIS — H5213 Myopia, bilateral: Secondary | ICD-10-CM | POA: Diagnosis not present

## 2024-02-28 DIAGNOSIS — M9901 Segmental and somatic dysfunction of cervical region: Secondary | ICD-10-CM | POA: Diagnosis not present

## 2024-02-28 DIAGNOSIS — M546 Pain in thoracic spine: Secondary | ICD-10-CM | POA: Diagnosis not present

## 2024-02-28 DIAGNOSIS — M47816 Spondylosis without myelopathy or radiculopathy, lumbar region: Secondary | ICD-10-CM | POA: Diagnosis not present

## 2024-02-28 DIAGNOSIS — M9902 Segmental and somatic dysfunction of thoracic region: Secondary | ICD-10-CM | POA: Diagnosis not present

## 2024-02-28 DIAGNOSIS — M9903 Segmental and somatic dysfunction of lumbar region: Secondary | ICD-10-CM | POA: Diagnosis not present

## 2024-02-28 DIAGNOSIS — M47812 Spondylosis without myelopathy or radiculopathy, cervical region: Secondary | ICD-10-CM | POA: Diagnosis not present

## 2024-02-28 DIAGNOSIS — M5441 Lumbago with sciatica, right side: Secondary | ICD-10-CM | POA: Diagnosis not present

## 2024-03-10 DIAGNOSIS — Z08 Encounter for follow-up examination after completed treatment for malignant neoplasm: Secondary | ICD-10-CM | POA: Diagnosis not present

## 2024-03-10 DIAGNOSIS — L568 Other specified acute skin changes due to ultraviolet radiation: Secondary | ICD-10-CM | POA: Diagnosis not present

## 2024-03-10 DIAGNOSIS — Z85828 Personal history of other malignant neoplasm of skin: Secondary | ICD-10-CM | POA: Diagnosis not present

## 2024-03-15 ENCOUNTER — Other Ambulatory Visit: Payer: Self-pay | Admitting: Urology

## 2024-03-15 DIAGNOSIS — N401 Enlarged prostate with lower urinary tract symptoms: Secondary | ICD-10-CM

## 2024-03-19 ENCOUNTER — Encounter: Payer: Self-pay | Admitting: Gastroenterology

## 2024-03-24 ENCOUNTER — Other Ambulatory Visit

## 2024-03-24 DIAGNOSIS — R972 Elevated prostate specific antigen [PSA]: Secondary | ICD-10-CM | POA: Diagnosis not present

## 2024-03-25 ENCOUNTER — Ambulatory Visit: Payer: Self-pay | Admitting: Urology

## 2024-03-25 LAB — PSA: Prostate Specific Ag, Serum: 3.7 ng/mL (ref 0.0–4.0)

## 2024-03-29 NOTE — Progress Notes (Unsigned)
 Impression/Assessment:  1.  BPH, on dual medical therapy with continued excellent symptomatic response  2.  Elevated PSA, negative biopsy 10 years ago, stable PSA trend  3.  Asymptomatic small right lower pole stone  Plan:  1.  He will continue on dual medical therapy  2.  I will have him come back in a year with repeat PSA  History of Present Illness:   Here for f/u of elevated PSA.   He had a negative biopsy in Aug 2014. At that time, PSA was 9.8, prostatic volume was 90 mL, PSAD 0.11.  All 12 cores were benign..    6.2.2020: PSA 2.7 (corrected 5.4)    3.2.2021: PSA 4.8--corrected 9.6     3.8.2022: PSA 5.9 (corrected 11.8) IPSS 4   QoL score 2.  Recheck PSA in June 2021 3.9.   12.6.2022: PSA 4.3 (corrected 8.6).  He is still on tamsulosin  as well as finasteride . IPSS 3, quality-of-life score 2.    12.5.2023: PSA 6 months ago was 3.6, corrected value 7.2.    12.10.2024: PSA 4.9 (corrected value 9.8). IPSS 5/2 Still on dual med Rx--finasteride /tamsulosin   12.16.2025: PSA 3.7  Past Medical History:  Diagnosis Date   Arteriosclerotic cardiovascular disease (ASCVD)    CABG in 08/1998  (LIMA-mLAD, SVG-rPDA, SVG-OM3-with Y SVG-lateral OM3.)   Atypical nevus 11/29/2005   slight atypia - left hand   Basal cell carcinoma of skin 03/22/2004   Left scapula, superior - CX3 + 5FU   Basal cell carcinoma of skin 03/22/2004   Left scapula, inferior - CX3 + excision   Basal cell carcinoma of skin 03/22/2004   Center mid back - CX3 + 5FU   Basal cell carcinoma of skin 07/24/2004   Left shin - CX3 + 5FU   Basal cell carcinoma of skin 11/29/2005   Left shoulder - CX3 + 5FU   Basal cell carcinoma of skin 10/22/2006   ulcerated on right outer knee - CX3+5FU   Basal cell carcinoma of skin 12/02/2008   left upper arm, superior - CX3+5FU   Basal cell carcinoma of skin 05/16/2011   right shoulder   Basal cell carcinoma of skin 05/16/2011   ulcerated above left sideburn   Basal  cell carcinoma of skin 05/16/2011   lateral left arm   Basal cell carcinoma of skin 06/29/2015   nodular on left ear - tx p bx   Basal cell carcinoma of skin 06/08/2016   superficial on left scapha, ear (MOHs)   Basal cell carcinoma of skin 07/25/2017   nodular on left ear (MOHS)   Basal cell carcinoma of skin 07/25/2017   nodular on left thigh - tx p bx   Basal cell carcinoma of skin 11/18/2018   nodular on right nare - tx p bx   BCC (basal cell carcinoma of skin) 08/25/2019   left upper arm anterior (CX35FU)   BCC (basal cell carcinoma of skin) 08/25/2019   right chin   BPH (benign prostatic hypertrophy)    h/x of prostatis/ UTI in 2006   Chronic kidney disease, stage II (mild) 2010   Creatinine of 1.5 in 2010   GERD (gastroesophageal reflux disease)    s/p espohageal dilatatin and repair of hiatal hernia   Hyperlipidemia    Hypertension    IBS (irritable bowel syndrome)    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   Neck-posterior   PONV (postoperative nausea and vomiting)    Retinal tear of right eye    SCC (  squamous cell carcinoma) 08/25/2019   right buccal cheek (CX35FU)   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   right elbow(CX35FU)   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   Right Forearm Upper(well diff) (tx p bx)   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   Right Index Finger(in situ) (tx p bx)   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   Left Forearm,upper(in situ) (tx p bx)   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/18/2018   Base of Left Middle Finger(in situ) (tx p bx)   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/18/2018   Right Outer Forearm,sup(in situ) (tx p bx)   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/18/2018   Right Forearm,Inf(in situ) (tx p bx)   Sigmoid diverticulosis    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   in situ on right hand - CX3 + 5FU   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   in situ on right  ear - CX3 + 5FU   Squamous cell carcinoma of skin 01/21/2006   in situ on upper mid back - tx p bx   Squamous cell carcinoma of skin 01/21/2006   in situ on top left shoulder - tx p bx   Squamous cell carcinoma of skin 10/22/2006   KA under left eye - MOHs   Squamous cell carcinoma of skin 12/02/2008   in situ on left base of 4th finger - CX3+5FU   Squamous cell carcinoma of skin 08/30/2009   well differentiated on right jawline   Squamous cell carcinoma of skin 08/30/2009   in situ on left upper arm - CX3+imiquimod   Squamous cell carcinoma of skin 02/25/2014   in situ on left temple   Squamous cell carcinoma of skin 02/25/2014   in situ on left arm   Squamous cell carcinoma of skin 06/08/2016   in situ on left lower side neck - tx p bx   Squamous cell carcinoma of skin 06/08/2016   left shin - tx p bx   Squamous cell carcinoma of skin 06/08/2016   in situ on right temple - tx p bx   Squamous cell carcinoma of skin 06/08/2016   in situ on right jawline - tx p bx    Past Surgical History:  Procedure Laterality Date   BREAST BIOPSY Right 08/17/2013   Procedure: RIGHT BREAST BIOPSY;  Surgeon: Oneil DELENA Budge, MD;  Location: [facility];  Service: General;  Laterality: Right;   CHOLECYSTECTOMY  1990s   COLONOSCOPY  03/17/2012   [place]; colonic diverticulosis, repeat screening colonoscopy in 10 years.   CORONARY ARTERY BYPASS GRAFT  04/16/1998   ESOPHAGOGASTRODUODENOSCOPY  08/15/2007   [place]: Food impaction, critical esophageal ring/stricture requiring dilation   HEMORRHOID SURGERY  04/16/2005   total of three   HIATAL HERNIA REPAIR  04/16/1993   LEFT HEART CATH AND CORS/GRAFTS ANGIOGRAPHY N/A 08/23/2016   Procedure: Left Heart Cath and Cors/Grafts Angiography;  Surgeon: Anner Alm ORN, MD;  Location: [facility];  Service: Cardiovascular;  Laterality: N/A;   LUMBAR LAMINECTOMY  04/16/1997   RETINAL DETACHMENT SURGERY     TEAR DUCT PROBING WITH STRABISMUS REPAIR      Home  Medications:  Allergies as of 03/31/2024       Reactions   Meperidine Nausea And Vomiting, Nausea Only   Meperidine Hcl Nausea Only   Statins Other (See Comments)   Severe muscle pain   Codeine Nausea And Vomiting   Hydralazine   neuropathy    Nalbuphine Nausea And Vomiting   Spironolactone     MUSCLE PAIN         Medication List        Accurate as of March 29, 2024 10:29 AM. If you have any questions, ask your nurse or doctor.          acetaminophen  325 MG tablet Commonly known as: TYLENOL  Take 650 mg by mouth every 6 (six) hours as needed for moderate pain.   amLODipine  10 MG tablet Commonly known as: NORVASC  TAKE 1 TABLET BY MOUTH EVERY DAY   aspirin  EC 81 MG tablet Take 81 mg by mouth at bedtime.   ezetimibe  10 MG tablet Commonly known as: ZETIA  TAKE 1 TABLET BY MOUTH EVERY DAY   finasteride  5 MG tablet Commonly known as: PROSCAR  TAKE 1 TABLET BY MOUTH EVERY DAY IN THE EVENING   hyoscyamine  0.125 MG SL tablet Commonly known as: Levsin /SL Place 1 tablet (0.125 mg total) under the tongue every 6 (six) hours as needed (for abdominal cramping and loose stools.).   isosorbide  mononitrate 30 MG 24 hr tablet Commonly known as: IMDUR  TAKE 1/2 OF A TABLET (15 MG TOTAL) BY MOUTH DAILY   omeprazole 20 MG capsule Commonly known as: PRILOSEC Take 20 mg by mouth 2 (two) times daily. Before LUNCH and Before SUPPER   polyethylene glycol powder 17 GM/SCOOP powder Commonly known as: GLYCOLAX /MIRALAX  Take 17 g by mouth daily.   pravastatin 40 MG tablet Commonly known as: PRAVACHOL Take 40 mg by mouth at bedtime.   PROBIOTIC DAILY PO Take 1 capsule by mouth daily.   psyllium 58.6 % powder Commonly known as: METAMUCIL Take 1 packet by mouth daily.   tamsulosin  0.4 MG Caps capsule Commonly known as: FLOMAX  Take 0.4 mg by mouth at bedtime.   zolpidem 10 MG tablet Commonly known as: AMBIEN Take 5 tablets by mouth at bedtime.        Allergies:   Allergies  Allergen Reactions   Meperidine Nausea And Vomiting and Nausea Only   Meperidine Hcl Nausea Only   Statins Other (See Comments)    Severe muscle pain   Codeine Nausea And Vomiting   Hydralazine      neuropathy    Nalbuphine Nausea And Vomiting   Spironolactone      MUSCLE PAIN     Family History  Problem Relation Age of Onset   Coronary artery disease Father        also grandfather   Colon cancer Neg Hx     Social History:  reports that he has never smoked. He has never used smokeless tobacco. He reports that he does not currently use alcohol after a past usage of about 2.0 standard drinks of alcohol per week. He reports that he does not use drugs.  ROS: A complete review of systems was performed.  All systems are negative except for pertinent findings as noted.  Physical Exam:  Vital signs in last 24 hours: There were no vitals taken for this visit. Constitutional:  Alert and oriented, No acute distress Cardiovascular: Regular rate  Respiratory: Normal respiratory effort Abd: No inguinal hernias Genitourinary: Normal anal sphincter tone.  Prostate 80 mL, no nodules/tenderness. Lymphatic: No lymphadenopathy Neurologic: Grossly intact, no focal deficits Psychiatric: Normal mood and affect  I have reviewed prior pt notes  I have reviewed urinalysis results  I have independently reviewed prior imaging--prior prostate ultrasound  I have reviewed prior PSA results

## 2024-03-31 ENCOUNTER — Ambulatory Visit: Payer: Medicare HMO | Admitting: Urology

## 2024-03-31 VITALS — BP 142/64 | HR 80

## 2024-03-31 DIAGNOSIS — R972 Elevated prostate specific antigen [PSA]: Secondary | ICD-10-CM

## 2024-03-31 DIAGNOSIS — N401 Enlarged prostate with lower urinary tract symptoms: Secondary | ICD-10-CM

## 2024-03-31 LAB — URINALYSIS, ROUTINE W REFLEX MICROSCOPIC
Bilirubin, UA: NEGATIVE
Glucose, UA: NEGATIVE
Ketones, UA: NEGATIVE
Leukocytes,UA: NEGATIVE
Nitrite, UA: NEGATIVE
Protein,UA: NEGATIVE
RBC, UA: NEGATIVE
Specific Gravity, UA: 1.015 (ref 1.005–1.030)
Urobilinogen, Ur: 1 mg/dL (ref 0.2–1.0)
pH, UA: 6 (ref 5.0–7.5)

## 2024-04-04 ENCOUNTER — Other Ambulatory Visit: Payer: Self-pay | Admitting: Cardiology

## 2024-04-14 ENCOUNTER — Telehealth: Payer: Self-pay | Admitting: Cardiology

## 2024-04-14 DIAGNOSIS — E782 Mixed hyperlipidemia: Secondary | ICD-10-CM

## 2024-04-14 NOTE — Telephone Encounter (Signed)
 Pt c/o medication issue:  1. Name of Medication:   pravastatin (PRAVACHOL) 40 MG tablet    2. How are you currently taking this medication (dosage and times per day)? As written  3. Are you having a reaction (difficulty breathing--STAT)? No  4. What is your medication issue? Pt would like a c/b regarding switching from above medication to Repatha as dicussed previously. Please advise

## 2024-04-14 NOTE — Telephone Encounter (Signed)
 Forward to MD for review.

## 2024-04-15 NOTE — Telephone Encounter (Signed)
 Can we repeat a lipid panel to see where his cholesterol numbers are? Is he feeling like he is having side effects on the current dose of the pravastatin, at prior visit he was tolerating the current dose. Or is he just favoring getting off a statin  J Havannah Streat MD

## 2024-04-15 NOTE — Telephone Encounter (Signed)
 [name] says he he has muscle cramps and neuropathy and after speaking with Dr.Golding, he wants to take Repatha. Will have labs done next week.

## 2024-04-21 LAB — LIPID PANEL
Chol/HDL Ratio: 2.4 ratio (ref 0.0–5.0)
Cholesterol, Total: 154 mg/dL (ref 100–199)
HDL: 65 mg/dL
LDL Chol Calc (NIH): 73 mg/dL (ref 0–99)
Triglycerides: 85 mg/dL (ref 0–149)
VLDL Cholesterol Cal: 16 mg/dL (ref 5–40)

## 2024-04-28 NOTE — Telephone Encounter (Signed)
 Patient is following up requesting to speak with RN regarding switching to Repatha. Please advise.

## 2024-04-30 NOTE — Addendum Note (Signed)
 Addended by: KENETH KNEE C on: 04/30/2024 12:56 PM   Modules accepted: Orders

## 2024-04-30 NOTE — Telephone Encounter (Signed)
 Patient is calling back to receive update on concerns; is requesting a call back at   [phone]

## 2024-04-30 NOTE — Telephone Encounter (Signed)
 Spoke to patient who stated that he has been having leg pain with taking the Pravastatin. Patient had labs completed. Please review. Patient is requesting to be switched to Repatha.   Please advise.

## 2024-04-30 NOTE — Telephone Encounter (Signed)
 Patient notified. Referral entered for PharmD. Will route message to schedulers for appointment.

## 2024-05-05 ENCOUNTER — Ambulatory Visit: Payer: Self-pay | Admitting: Cardiology

## 2024-05-21 ENCOUNTER — Ambulatory Visit

## 2024-05-21 DIAGNOSIS — E785 Hyperlipidemia, unspecified: Secondary | ICD-10-CM

## 2024-05-21 NOTE — Progress Notes (Signed)
 Patient ID: [name]                 DOB: 1945/04/21                    MRN: [id number]      HPI: [name] is a 79 y.o. male patient referred to lipid clinic by Dr. Alvan. 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  Patient was last seen by Dr. Alvan in September 2025 for follow up appointment. He was taking pravastatin 40 mg daily and ezetimibe  10 mg daily at the time. Reported no issues with that regimen. LDL was not goal but patient was somewhat hesitant to consider PCSK9i. However, patient called in and reported muscle cramps on pravastatin and would like to take PCSK9i. Pravastatin was discontinued and patient referred to PharmD lipid clinic.  Patient presents today for PharmD lipid clinic. He is currently taking ezetimibe  10 mg daily. Most recent LDL 73 which is reflective of ezetimibe  and pravastatin therapy. He is interested in PCSK9i therapy. He reports that he previously participated in a bempedoic acid clinical trial years ago and responded well to the therapy, but the medication became cost-prohibitive once the trial ended. He also notes that he is currently seeing orthopedics and a chiropractor to evaluate ongoing back pain.  Reviewed options for lowering LDL cholesterol, including PCSK-9 inhibitors. Discussed mechanisms of action, dosing, side effects and potential decreases in LDL cholesterol.  Also reviewed cost information and potential options for patient assistance.   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 He eats baked/grilled foods and vegetables. Beverages: water , coffee, tea  Exercise:  He is very active working on the  farm   Family History:   Relation Problem Comments  Mother (Deceased)   Father (Deceased at age 49) Coronary artery disease also grandfather    Brother (Deceased)   Paternal Grandfather (Deceased at age 66)   Neg Hx Colon cancer      Social History:  Alcohol: none Smoking: none   Labs:  Lipid Panel     Component Value Date/Time   CHOL 154 04/21/2024 0833   TRIG 85 04/21/2024 0833   HDL 65 04/21/2024 0833   CHOLHDL 2.4 04/21/2024 0833   CHOLHDL 4.8 08/23/2016 0508   VLDL 20 08/23/2016 0508   LDLCALC 73 04/21/2024 0833   LABVLDL 16 04/21/2024 0833    Past Medical History:  Diagnosis Date   Arteriosclerotic cardiovascular disease (ASCVD)    CABG in 08/1998  (LIMA-mLAD, SVG-rPDA, SVG-OM3-with Y SVG-lateral OM3.)   Atypical nevus 11/29/2005   slight atypia - left hand   Basal cell carcinoma of skin 03/22/2004   Left scapula, superior - CX3 + 5FU   Basal cell carcinoma of skin 03/22/2004   Left scapula, inferior - CX3 + excision   Basal cell carcinoma of skin 03/22/2004   Center mid back - CX3 + 5FU   Basal cell carcinoma of skin 07/24/2004   Left shin - CX3 + 5FU   Basal cell carcinoma of skin 11/29/2005   Left shoulder - CX3 + 5FU   Basal cell carcinoma of  skin 10/22/2006   ulcerated on right outer knee - CX3+5FU   Basal cell carcinoma of skin 12/02/2008   left upper arm, superior - CX3+5FU   Basal cell carcinoma of skin 05/16/2011   right shoulder   Basal cell carcinoma of skin 05/16/2011   ulcerated above left sideburn   Basal cell carcinoma of skin 05/16/2011   lateral left arm   Basal cell carcinoma of skin 06/29/2015   nodular on left ear - tx p bx   Basal cell carcinoma of skin 06/08/2016   superficial on left scapha, ear (MOHs)   Basal cell carcinoma of skin 07/25/2017   nodular on left ear (MOHS)   Basal cell carcinoma of skin 07/25/2017   nodular on left thigh - tx p bx   Basal cell carcinoma of skin 11/18/2018   nodular on right nare - tx p bx    BCC (basal cell carcinoma of skin) 08/25/2019   left upper arm anterior (CX35FU)   BCC (basal cell carcinoma of skin) 08/25/2019   right chin   BPH (benign prostatic hypertrophy)    h/x of prostatis/ UTI in 2006   Chronic kidney disease, stage II (mild) 2010   Creatinine of 1.5 in 2010   GERD (gastroesophageal reflux disease)    s/p espohageal dilatatin and repair of hiatal hernia   Hyperlipidemia    Hypertension    IBS (irritable bowel syndrome)    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   Neck-posterior   PONV (postoperative nausea and vomiting)    Retinal tear of right eye    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   right buccal cheek (CX35FU)   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   right elbow(CX35FU)   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   Right Forearm Upper(well diff) (tx p bx)   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   Right Index Finger(in situ) (tx p bx)   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   Left Forearm,upper(in situ) (tx p bx)   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/18/2018   Base of Left Middle Finger(in situ) (tx p bx)   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/18/2018   Right Outer Forearm,sup(in situ) (tx p bx)   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/18/2018   Right Forearm,Inf(in situ) (tx p bx)   Sigmoid diverticulosis    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   in situ on right hand - CX3 + 5FU   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   in situ on right ear - CX3 + 5FU   Squamous cell carcinoma of skin 01/21/2006   in situ on upper mid back - tx p bx   Squamous cell carcinoma of skin 01/21/2006   in situ on top left shoulder - tx p bx   Squamous cell carcinoma of skin 10/22/2006   KA under left eye - MOHs   Squamous cell carcinoma of skin 12/02/2008   in situ on left base of 4th finger - CX3+5FU   Squamous cell carcinoma of skin 08/30/2009   well differentiated on right jawline    Squamous cell carcinoma of skin 08/30/2009   in situ on left upper arm - CX3+imiquimod   Squamous cell carcinoma of skin 02/25/2014   in situ on left temple   Squamous cell carcinoma of skin 02/25/2014   in situ on left arm   Squamous cell carcinoma of skin 06/08/2016   in situ on left  lower side neck - tx p bx   Squamous cell carcinoma of skin 06/08/2016   left shin - tx p bx   Squamous cell carcinoma of skin 06/08/2016   in situ on right temple - tx p bx   Squamous cell carcinoma of skin 06/08/2016   in situ on right jawline - tx p bx    Medications Ordered Prior to Encounter[1]  Allergies[2]  Assessment/Plan:  1. 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 Trice Aspinall, Pharm.D, CPP [place] [facility]IR[facility]. [facility] [address], [place], [place] [address] Phone: [phone]; Fax: [phone]        [1]  Current Outpatient Medications on File Prior to Visit  Medication Sig Dispense Refill   acetaminophen  (TYLENOL ) 325 MG tablet Take 650 mg by mouth every 6 (six) hours as needed for moderate pain.     amLODipine  (NORVASC ) 10 MG tablet TAKE 1 TABLET BY MOUTH EVERY DAY 90 tablet 3   aspirin  81 MG EC tablet Take 81 mg by mouth at bedtime.     ezetimibe  (ZETIA ) 10 MG tablet TAKE 1 TABLET BY MOUTH EVERY DAY  90 tablet 3   finasteride  (PROSCAR ) 5 MG tablet TAKE 1 TABLET BY MOUTH EVERY DAY IN THE EVENING 90 tablet 3   gabapentin (NEURONTIN) 300 MG capsule Take 300 mg by mouth at bedtime.     hyoscyamine  (LEVSIN /SL) 0.125 MG SL tablet Place 1 tablet (0.125 mg total) under the tongue every 6 (six) hours as needed (for abdominal cramping and loose stools.). 30 tablet 1   isosorbide  mononitrate (IMDUR ) 30 MG 24 hr tablet TAKE 1/2 OF A TABLET (15 MG TOTAL) BY MOUTH DAILY 45 tablet 1   omeprazole (PRILOSEC) 20 MG capsule Take 20 mg by mouth 2 (two) times daily. Before LUNCH and Before SUPPER     polyethylene glycol powder (GLYCOLAX /MIRALAX ) 17 GM/SCOOP powder Take 17 g by mouth daily. 255 g 0   Probiotic Product (PROBIOTIC DAILY PO) Take 1 capsule by mouth daily.      psyllium (METAMUCIL) 58.6 % powder Take 1 packet by mouth daily.     tamsulosin  (FLOMAX ) 0.4 MG CAPS capsule Take 0.4 mg by mouth at bedtime.     zolpidem (AMBIEN) 10 MG tablet Take 5 tablets by mouth at bedtime.     No current facility-administered medications on file prior to visit.  [2]  Allergies Allergen Reactions   Meperidine Nausea And Vomiting and Nausea Only   Meperidine Hcl Nausea Only   Statins Other (See Comments)    Severe muscle pain   Codeine Nausea And Vomiting   Hydralazine      neuropathy    Nalbuphine Nausea And Vomiting   Spironolactone      MUSCLE PAIN

## 2024-05-21 NOTE — Assessment & Plan Note (Addendum)
 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

## 2024-05-21 NOTE — Patient Instructions (Signed)
 Your Results:             Your most recent labs Goal  Total Cholesterol 154 < 200  Triglycerides 85 < 150  HDL (happy/good cholesterol) 65 > 40  LDL (lousy/bad cholesterol 73 < 55   Medication changes: Continue ezetimibe  10 mg daily We will start the process to get Praluent or Repatha covered by your insurance.  Once the prior authorization is complete, we will call you to let you know and confirm pharmacy information.   You will take   Lab orders:We want to repeat labs 3 months after starting PCSK9i.  We will send you a lab order to        remind you once we get closer to that time.    [name] Susan E. Gabrella Stroh, Pharm.D, CPP [place] [facility]. [facility] [address], [place], [place] [address] Phone: [phone]; Fax: [phone]     Praluent is a cholesterol medication that improved your body's ability to get rid of bad cholesterol known as LDL. It can lower your LDL up to 60%. It is an injection that is given under the skin every 2 weeks. The most common side effects of Praluent include runny nose, symptoms of the common cold, rarely flu or flu-like symptoms, back/muscle pain in about 3-4% of the patients, and redness, pain, or bruising at the injection site.    Repatha is a cholesterol medication that improved your body's ability to get rid of bad cholesterol known as LDL. It can lower your LDL up to 60%! It is an injection that is given under the skin every 2 weeks. The medication often requires a prior authorization from your insurance company. We will take care of submitting all the necessary information to your insurance company to get it approved. The most common side effects of Repatha include runny nose, symptoms of the common cold, rarely flu or flu-like symptoms, back/muscle pain in about 3-4% of the patients, and redness, pain, or bruising at the injection site.    It is also recommended that patients with high cholesterol adhere to a  heart healthy diet, get regular exercise, avoid use of tobacco products, and maintain a healthy weight. Steps that you can take to help in these areas:  Limit consumption of trans fats, saturated fats, and cholesterol in your diet  Increase intake of lean meats such as chicken, turkey, and fish  Increase intake of foods rich in fiber such as fresh fruits, vegetables, beans and oatmeal Exercise as you are able; even 30 minutes of walking daily can aid in increasing heart health      Copay Assistance:  The [employer] foundation offers assistance to help pay for medication copays.  They will cover copays for all cholesterol lowering meds, including statins, fibrates, omega-3 oils, ezetimibe , Repatha, Praluent, Nexletol, Nexlizet.  The cards are usually good for $2,500 or 12 months, whichever comes first. Go to [employer]ation.org Click on Apply Now Answer questions as to whom is applying (patient or representative) Your disease fund will be hypercholesterolemia - Medicare access Select the cholesterol medication you need assistance with (Repatha, Praluent, Nexlizet...) They will ask question about qualifying diagnosis - you can mark yes; and do you have insurance coverage.   When they ask what type of assistance you are interested in - copay assistance When you submit, the approval is usually within minutes.  You will need to print the card information from the site You will need  to show this information to your pharmacy, they will bill your Medicare Part D plan first -then bill [employer] --for the copay.   You can also call them at [phone], although the hold times can be quite long.

## 2024-06-03 ENCOUNTER — Ambulatory Visit: Admitting: Gastroenterology

## 2024-07-21 ENCOUNTER — Ambulatory Visit: Admitting: Cardiology

## 2024-10-30 ENCOUNTER — Encounter (INDEPENDENT_AMBULATORY_CARE_PROVIDER_SITE_OTHER): Admitting: Ophthalmology
